# Patient Record
Sex: Female | Born: 1960
Health system: Southern US, Community
[De-identification: ages and names within clinical notes are randomized; demographics above are authoritative.]

## PROBLEM LIST (undated history)

## (undated) DIAGNOSIS — E119 Type 2 diabetes mellitus without complications: Secondary | ICD-10-CM

## (undated) DIAGNOSIS — E785 Hyperlipidemia, unspecified: Secondary | ICD-10-CM

## (undated) DIAGNOSIS — I10 Essential (primary) hypertension: Secondary | ICD-10-CM

## (undated) DIAGNOSIS — F1910 Other psychoactive substance abuse, uncomplicated: Secondary | ICD-10-CM

## (undated) DIAGNOSIS — E559 Vitamin D deficiency, unspecified: Secondary | ICD-10-CM

## (undated) DIAGNOSIS — B1920 Unspecified viral hepatitis C without hepatic coma: Secondary | ICD-10-CM

## (undated) DIAGNOSIS — Z972 Presence of dental prosthetic device (complete) (partial): Secondary | ICD-10-CM

## (undated) DIAGNOSIS — B182 Chronic viral hepatitis C: Secondary | ICD-10-CM

## (undated) HISTORY — DX: Other psychoactive substance abuse, uncomplicated: F19.10

## (undated) HISTORY — DX: Essential (primary) hypertension: I10

## (undated) HISTORY — DX: Chronic viral hepatitis C: B18.2

## (undated) HISTORY — PX: LAPAROSCOPIC HYSTERECTOMY: SHX1926

## (undated) HISTORY — DX: Unspecified viral hepatitis C without hepatic coma: B19.20

## (undated) HISTORY — PX: HUMERUS SURGERY: SHX672

---

## 2008-05-29 ENCOUNTER — Emergency Department: Payer: Self-pay | Admitting: Emergency Medicine

## 2008-07-06 ENCOUNTER — Ambulatory Visit: Payer: Self-pay | Admitting: Surgery

## 2008-07-12 ENCOUNTER — Ambulatory Visit: Payer: Self-pay | Admitting: Surgery

## 2009-06-23 HISTORY — PX: CHOLECYSTECTOMY: SHX55

## 2009-09-04 ENCOUNTER — Ambulatory Visit: Payer: Self-pay | Admitting: Family Medicine

## 2010-03-15 ENCOUNTER — Other Ambulatory Visit: Payer: Self-pay | Admitting: Specialist

## 2010-05-23 ENCOUNTER — Other Ambulatory Visit: Payer: Self-pay | Admitting: Specialist

## 2010-06-18 ENCOUNTER — Other Ambulatory Visit: Payer: Self-pay | Admitting: Specialist

## 2010-08-22 LAB — HM MAMMOGRAPHY: HM Mammogram: NORMAL

## 2010-09-17 ENCOUNTER — Ambulatory Visit: Payer: Self-pay | Admitting: Family Medicine

## 2010-10-25 LAB — HEPATIC FUNCTION PANEL
AST: 13 U/L (ref 13–35)
Alkaline Phosphatase: 54 U/L (ref 25–125)

## 2010-10-25 LAB — LIPID PANEL: LDL Cholesterol: 78 mg/dL

## 2010-10-25 LAB — CBC AND DIFFERENTIAL: WBC: 2.8 10^3/mL

## 2011-01-31 LAB — HEPATIC FUNCTION PANEL
ALT: 6 U/L — AB (ref 7–35)
AST: 14 U/L (ref 13–35)
Alkaline Phosphatase: 54 U/L (ref 25–125)
Bilirubin, Total: 0.3 mg/dL

## 2011-01-31 LAB — BASIC METABOLIC PANEL: Potassium: 4.3 mmol/L (ref 3.4–5.3)

## 2011-01-31 LAB — LIPID PANEL
Cholesterol: 154 mg/dL (ref 0–200)
Cholesterol: 181 mg/dL (ref 0–200)

## 2011-01-31 LAB — CBC AND DIFFERENTIAL
HCT: 34 % — AB (ref 36–46)
Hemoglobin: 11.7 g/dL — AB (ref 12.0–16.0)
Platelets: 266 10*3/uL (ref 150–399)

## 2011-01-31 LAB — TSH: TSH: 1.33 u[IU]/mL (ref 0.41–5.90)

## 2011-05-23 LAB — HEPATIC FUNCTION PANEL
AST: 14 U/L (ref 13–35)
Bilirubin, Total: 0.4 mg/dL

## 2011-05-23 LAB — BASIC METABOLIC PANEL
BUN: 10 mg/dL (ref 4–21)
Glucose: 104 mg/dL
Potassium: 4.4 mmol/L (ref 3.4–5.3)
Sodium: 139 mmol/L (ref 137–147)

## 2011-05-23 LAB — LIPID PANEL
HDL: 53 mg/dL (ref 35–70)
Triglycerides: 91 mg/dL (ref 40–160)

## 2011-07-11 ENCOUNTER — Ambulatory Visit (INDEPENDENT_AMBULATORY_CARE_PROVIDER_SITE_OTHER): Payer: PRIVATE HEALTH INSURANCE | Admitting: Internal Medicine

## 2011-07-11 ENCOUNTER — Ambulatory Visit: Payer: Self-pay | Admitting: Internal Medicine

## 2011-07-11 ENCOUNTER — Encounter: Payer: Self-pay | Admitting: Internal Medicine

## 2011-07-11 DIAGNOSIS — B182 Chronic viral hepatitis C: Secondary | ICD-10-CM

## 2011-07-11 DIAGNOSIS — J309 Allergic rhinitis, unspecified: Secondary | ICD-10-CM | POA: Insufficient documentation

## 2011-07-11 DIAGNOSIS — I10 Essential (primary) hypertension: Secondary | ICD-10-CM

## 2011-07-11 HISTORY — DX: Essential (primary) hypertension: I10

## 2011-07-11 HISTORY — DX: Chronic viral hepatitis C: B18.2

## 2011-07-11 MED ORDER — CETIRIZINE HCL 10 MG PO TABS: 10.0000 mg | ORAL_TABLET | Freq: Every day | ORAL | Status: DC

## 2011-07-11 MED ORDER — LISINOPRIL 10 MG PO TABS: 10.0000 mg | ORAL_TABLET | Freq: Every day | ORAL | Status: DC

## 2011-07-11 NOTE — Assessment & Plan Note (Signed)
Will request records from ID physician.

## 2011-07-11 NOTE — Assessment & Plan Note (Signed)
Symptoms well controlled on Zyrtec. Will continue.

## 2011-07-11 NOTE — Assessment & Plan Note (Signed)
BP well controlled on lisinopril. Will get old records and check renal function on recent labs. Follow up 1 months for physical.

## 2011-07-11 NOTE — Progress Notes (Signed)
Subjective:    Patient ID: Melissa Garrison, female    DOB: 1961-05-15, 51 y.o.   MRN: [id number]  HPI 51 year old female with a history of hypertension and hepatitis C presents to establish care. She reports that she is feeling well. In regards to her hypertension, she notes that her blood pressures have been well controlled on lisinopril. She denies any headache, chest pain, or shortness of breath. She reports full compliance with her medicine. She notes that her renal function was recently checked by her infectious disease physician.  In regards to her hepatitis C, she notes that she has followup with her infectious disease physician today to check levels to assess for cure. She reports completing course of medication earlier last year.  Outpatient Encounter Prescriptions as of 07/11/2011  Medication Sig Dispense Refill  . cetirizine (ZYRTEC) 10 MG tablet Take 1 tablet (10 mg total) by mouth daily.  30 tablet  6  . Cholecalciferol (VITAMIN D-3) 5000 UNITS TABS Take 1 tablet by mouth daily.      . Ferrous Gluconate (IRON) 240 (27 FE) MG TABS Take 1 tablet by mouth daily.      Marland Kitchen lisinopril (PRINIVIL,ZESTRIL) 10 MG tablet Take 1 tablet (10 mg total) by mouth daily.  30 tablet  6    Review of Systems  Constitutional: Negative for fever, chills, appetite change, fatigue and unexpected weight change.  HENT: Negative for ear pain, congestion, sore throat, trouble swallowing, neck pain, voice change and sinus pressure.   Eyes: Negative for visual disturbance.  Respiratory: Negative for cough, shortness of breath, wheezing and stridor.   Cardiovascular: Negative for chest pain, palpitations and leg swelling.  Gastrointestinal: Negative for nausea, vomiting, abdominal pain, diarrhea, constipation, blood in stool, abdominal distention and anal bleeding.  Genitourinary: Negative for dysuria and flank pain.  Musculoskeletal: Negative for myalgias, arthralgias and gait problem.  Skin: Negative for  color change and rash.  Neurological: Negative for dizziness and headaches.  Hematological: Negative for adenopathy. Does not bruise/bleed easily.  Psychiatric/Behavioral: Negative for suicidal ideas, sleep disturbance and dysphoric mood. The patient is not nervous/anxious.    BP 128/78  Pulse 74  Temp(Src) 98.7 F (37.1 C) (Oral)  Ht 5\' 4"  (1.626 m)  Wt 167 lb (75.751 kg)  BMI 28.67 kg/m2  SpO2 98%     Objective:   Physical Exam  Constitutional: She is oriented to person, place, and time. She appears well-developed and well-nourished. No distress.  HENT:  Head: Normocephalic and atraumatic.  Right Ear: External ear normal.  Left Ear: External ear normal.  Nose: Nose normal.  Mouth/Throat: Oropharynx is clear and moist. No oropharyngeal exudate.  Eyes: Conjunctivae are normal. Pupils are equal, round, and reactive to light. Right eye exhibits no discharge. Left eye exhibits no discharge. No scleral icterus.  Neck: Normal range of motion. Neck supple. No tracheal deviation present. No thyromegaly present.  Cardiovascular: Normal rate, regular rhythm, normal heart sounds and intact distal pulses.  Exam reveals no gallop and no friction rub.   No murmur heard. Pulmonary/Chest: Effort normal and breath sounds normal. No respiratory distress. She has no wheezes. She has no rales. She exhibits no tenderness.  Musculoskeletal: Normal range of motion. She exhibits no edema and no tenderness.  Lymphadenopathy:    She has no cervical adenopathy.  Neurological: She is alert and oriented to person, place, and time. No cranial nerve deficit. She exhibits normal muscle tone. Coordination normal.  Skin: Skin is warm and dry. No rash noted. She  is not diaphoretic. No erythema. No pallor.  Psychiatric: She has a normal mood and affect. Her behavior is normal. Judgment and thought content normal.          Assessment & Plan:

## 2011-08-21 ENCOUNTER — Encounter: Payer: Self-pay | Admitting: Internal Medicine

## 2011-08-22 ENCOUNTER — Ambulatory Visit (INDEPENDENT_AMBULATORY_CARE_PROVIDER_SITE_OTHER): Payer: PRIVATE HEALTH INSURANCE | Admitting: Internal Medicine

## 2011-08-22 ENCOUNTER — Encounter: Payer: Self-pay | Admitting: Internal Medicine

## 2011-08-22 VITALS — BP 122/66 | HR 97 | Temp 98.3°F | Ht 64.0 in | Wt 167.0 lb

## 2011-08-22 DIAGNOSIS — I10 Essential (primary) hypertension: Secondary | ICD-10-CM

## 2011-08-22 DIAGNOSIS — B182 Chronic viral hepatitis C: Secondary | ICD-10-CM

## 2011-08-22 DIAGNOSIS — M7710 Lateral epicondylitis, unspecified elbow: Secondary | ICD-10-CM

## 2011-08-22 DIAGNOSIS — M7711 Lateral epicondylitis, right elbow: Secondary | ICD-10-CM | POA: Insufficient documentation

## 2011-08-22 MED ORDER — CELECOXIB 200 MG PO CAPS: 200.0000 mg | ORAL_CAPSULE | Freq: Two times a day (BID) | ORAL | Status: AC

## 2011-08-22 NOTE — Patient Instructions (Signed)
Lateral Epicondylitis (Tennis Elbow) with Rehab Lateral epicondylitis involves inflammation and pain around the outer portion of the elbow. The pain is caused by inflammation of the tendons in the forearm that bring back (extend) the wrist. Lateral epicondylittis is also called tennis elbow, because it is very common in tennis players. However, it may occur in any individual who extends the wrist repetitively. If lateral epicondylitis is left untreated, it may become a chronic problem. SYMPTOMS   Pain, tenderness, and inflammation on the outer (lateral) side of the elbow.   Pain or weakness with gripping activities.   Pain that increases with wrist twisting motions (playing tennis, using a screwdriver, opening a door or a jar).   Pain with lifting objects, including a coffee cup.  CAUSES  Lateral epicondylitis is caused by inflammation of the tendons that extend the wrist. Causes of injury may include:  Repetitive stress and strain on the muscles and tendons that extend the wrist.   Sudden change in activity level or intensity.   Incorrect grip in racquet sports.   Incorrect grip size of racquet (often too large).   Incorrect hitting position or technique (usually backhand, leading with the elbow).   Using a racket that is too heavy.  RISK INCREASES WITH:  Sports or occupations that require repetitive and/or strenuous forearm and wrist movements (tennis, squash, racquetball, carpentry).   Poor wrist and forearm strength and flexibility.   Failure to warm up properly before activity.   Resuming activity before healing, rehabilitation, and conditioning are complete.  PREVENTION   Warm up and stretch properly before activity.   Maintain physical fitness:   Strength, flexibility, and endurance.   Cardiovascular fitness.   Wear and use properly fitted equipment.   Learn and use proper technique and have a coach correct improper technique.   Wear a tennis elbow  (counterforce) brace.  PROGNOSIS  The course of this condition depends on the degree of the injury. If treated properly, acute cases (symptoms lasting less than 4 weeks) are often resolved in 2 to 6 weeks. Chronic (longer lasting cases) often resolve in 3 to 6 months, but may require physical therapy. RELATED COMPLICATIONS   Frequently recurring symptoms, resulting in a chronic problem. Properly treating the problem the first time decreases frequency of recurrence.   Chronic inflammation, scarring tendon degeneration, and partial tendon tear, requiring surgery.   Delayed healing or resolution of symptoms.  TREATMENT  Treatment first involves the use of ice and medicine, to reduce pain and inflammation. Strengthening and stretching exercises may help reduce discomfort, if performed regularly. These exercises may be performed at home, if the condition is an acute injury. Chronic cases may require a referral to a physical therapist for evaluation and treatment. Your caregiver may advise a corticosteroid injection, to help reduce inflammation. Rarely, surgery is needed. MEDICATION  If pain medicine is needed, nonsteroidal anti-inflammatory medicines (aspirin and ibuprofen), or other minor pain relievers (acetaminophen), are often advised.   Do not take pain medicine for 7 days before surgery.   Prescription pain relievers may be given, if your caregiver thinks they are needed. Use only as directed and only as much as you need.   Corticosteroid injections may be recommended. These injections should be reserved only for the most severe cases, because they can only be given a certain number of times.  HEAT AND COLD  Cold treatment (icing) should be applied for 10 to 15 minutes every 2 to 3 hours for inflammation and pain, and immediately   after activity that aggravates your symptoms. Use ice packs or an ice massage.   Heat treatment may be used before performing stretching and strengthening  activities prescribed by your caregiver, physical therapist, or athletic trainer. Use a heat pack or a warm water soak.  SEEK MEDICAL CARE IF: Symptoms get worse or do not improve in 2 weeks, despite treatment. EXERCISES  RANGE OF MOTION (ROM) AND STRETCHING EXERCISES - Epicondylitis, Lateral (Tennis Elbow) These exercises may help you when beginning to rehabilitate your injury. Your symptoms may go away with or without further involvement from your physician, physical therapist or athletic trainer. While completing these exercises, remember:   Restoring tissue flexibility helps normal motion to return to the joints. This allows healthier, less painful movement and activity.   An effective stretch should be held for at least 30 seconds.   A stretch should never be painful. You should only feel a gentle lengthening or release in the stretched tissue.  RANGE OF MOTION - Wrist Flexion, Active-Assisted  Extend your right / left elbow with your fingers pointing down.*   Gently pull the back of your hand towards you, until you feel a gentle stretch on the top of your forearm.   Hold this position for __________ seconds.  Repeat __________ times. Complete this exercise __________ times per day.  *If directed by your physician, physical therapist or athletic trainer, complete this stretch with your elbow bent, rather than extended. RANGE OF MOTION - Wrist Extension, Active-Assisted  Extend your right / left elbow and turn your palm upwards.*   Gently pull your palm and fingertips back, so your wrist extends and your fingers point more toward the ground.   You should feel a gentle stretch on the inside of your forearm.   Hold this position for __________ seconds.  Repeat __________ times. Complete this exercise __________ times per day. *If directed by your physician, physical therapist or athletic trainer, complete this stretch with your elbow bent, rather than extended. STRETCH - Wrist  Flexion  Place the back of your right / left hand on a tabletop, leaving your elbow slightly bent. Your fingers should point away from your body.   Gently press the back of your hand down onto the table by straightening your elbow. You should feel a stretch on the top of your forearm.   Hold this position for __________ seconds.  Repeat __________ times. Complete this stretch __________ times per day.  STRETCH - Wrist Extension   Place your right / left fingertips on a tabletop, leaving your elbow slightly bent. Your fingers should point backwards.   Gently press your fingers and palm down onto the table by straightening your elbow. You should feel a stretch on the inside of your forearm.   Hold this position for __________ seconds.  Repeat __________ times. Complete this stretch __________ times per day.  STRENGTHENING EXERCISES - Epicondylitis, Lateral (Tennis Elbow) These exercises may help you when beginning to rehabilitate your injury. They may resolve your symptoms with or without further involvement from your physician, physical therapist or athletic trainer. While completing these exercises, remember:   Muscles can gain both the endurance and the strength needed for everyday activities through controlled exercises.   Complete these exercises as instructed by your physician, physical therapist or athletic trainer. Increase the resistance and repetitions only as guided.   You may experience muscle soreness or fatigue, but the pain or discomfort you are trying to eliminate should never worsen during these exercises. If   this pain does get worse, stop and make sure you are following the directions exactly. If the pain is still present after adjustments, discontinue the exercise until you can discuss the trouble with your caregiver.  STRENGTH - Wrist Flexors  Sit with your right / left forearm palm-up and fully supported on a table or countertop. Your elbow should be resting below the  height of your shoulder. Allow your wrist to extend over the edge of the surface.   Loosely holding a __________ weight, or a piece of rubber exercise band or tubing, slowly curl your hand up toward your forearm.   Hold this position for __________ seconds. Slowly lower the wrist back to the starting position in a controlled manner.  Repeat __________ times. Complete this exercise __________ times per day.  STRENGTH - Wrist Extensors  Sit with your right / left forearm palm-down and fully supported on a table or countertop. Your elbow should be resting below the height of your shoulder. Allow your wrist to extend over the edge of the surface.   Loosely holding a __________ weight, or a piece of rubber exercise band or tubing, slowly curl your hand up toward your forearm.   Hold this position for __________ seconds. Slowly lower the wrist back to the starting position in a controlled manner.  Repeat __________ times. Complete this exercise __________ times per day.  STRENGTH - Ulnar Deviators  Stand with a ____________________ weight in your right / left hand, or sit while holding a rubber exercise band or tubing, with your healthy arm supported on a table or countertop.   Move your wrist, so that your pinkie travels toward your forearm and your thumb moves away from your forearm.   Hold this position for __________ seconds and then slowly lower the wrist back to the starting position.  Repeat __________ times. Complete this exercise __________ times per day STRENGTH - Radial Deviators  Stand with a ____________________ weight in your right / left hand, or sit while holding a rubber exercise band or tubing, with your injured arm supported on a table or countertop.   Raise your hand upward in front of you or pull up on the rubber tubing.   Hold this position for __________ seconds and then slowly lower the wrist back to the starting position.  Repeat __________ times. Complete this  exercise __________ times per day. STRENGTH - Forearm Supinators   Sit with your right / left forearm supported on a table, keeping your elbow below shoulder height. Rest your hand over the edge, palm down.   Gently grip a hammer or a soup ladle.   Without moving your elbow, slowly turn your palm and hand upward to a "thumbs-up" position.   Hold this position for __________ seconds. Slowly return to the starting position.  Repeat __________ times. Complete this exercise __________ times per day.  STRENGTH - Forearm Pronators   Sit with your right / left forearm supported on a table, keeping your elbow below shoulder height. Rest your hand over the edge, palm up.   Gently grip a hammer or a soup ladle.   Without moving your elbow, slowly turn your palm and hand upward to a "thumbs-up" position.   Hold this position for __________ seconds. Slowly return to the starting position.  Repeat __________ times. Complete this exercise __________ times per day.  STRENGTH - Grip  Grasp a tennis ball, a dense sponge, or a large, rolled sock in your hand.   Squeeze as hard   as you can, without increasing any pain.   Hold this position for __________ seconds. Release your grip slowly.  Repeat __________ times. Complete this exercise __________ times per day.  STRENGTH - Elbow Extensors, Isometric  Stand or sit upright, on a firm surface. Place your right / left arm so that your palm faces your stomach, and it is at the height of your waist.   Place your opposite hand on the underside of your forearm. Gently push up as your right / left arm resists. Push as hard as you can with both arms, without causing any pain or movement at your right / left elbow. Hold this stationary position for __________ seconds.  Gradually release the tension in both arms. Allow your muscles to relax completely before repeating. Document Released: 06/09/2005 Document Revised: 02/19/2011 Document Reviewed:  09/21/2008 [employer] Patient Information 2012 [employer]. 

## 2011-08-22 NOTE — Progress Notes (Signed)
Subjective:    Patient ID: Melissa Garrison, female    DOB: 07-15-1960, 51 y.o.   MRN: [id number]  HPI 51YO female with h/o hypertension and Hep C presents for follow up.  In regards to [facility], she notes recent PCR testing was negative.  She has been discharged from her ID physician. In regards to HTN, she reports full compliance with her medications.  She denies chest pain, palpitations, or headache.  She is concerned today about several days of lateral right elbow pain.  Pain radiates down right forearm, described as aching.  She works in a [employer] where she frequently uses her arms.  She has not taken any medication for pain.  She has not had any weakness in her right arm.  Outpatient Encounter Prescriptions as of 08/22/2011  Medication Sig Dispense Refill  . cetirizine (ZYRTEC) 10 MG tablet Take 1 tablet (10 mg total) by mouth daily.  30 tablet  6  . Cholecalciferol (VITAMIN D-3) 5000 UNITS TABS Take 1 tablet by mouth daily.      . diphenhydramine-acetaminophen (TYLENOL PM) 25-500 MG TABS Take 1 tablet by mouth at bedtime as needed.      . Ferrous Gluconate (IRON) 240 (27 FE) MG TABS Take 1 tablet by mouth daily.      Marland Kitchen lisinopril (PRINIVIL,ZESTRIL) 10 MG tablet Take 1 tablet (10 mg total) by mouth daily.  30 tablet  6  . celecoxib (CELEBREX) 200 MG capsule Take 1 capsule (200 mg total) by mouth 2 (two) times daily.  30 capsule  0    Review of Systems  Constitutional: Negative for fever, chills, appetite change, fatigue and unexpected weight change.  HENT: Negative for ear pain, congestion, sore throat, trouble swallowing, neck pain, voice change and sinus pressure.   Eyes: Negative for visual disturbance.  Respiratory: Negative for cough, shortness of breath, wheezing and stridor.   Cardiovascular: Negative for chest pain, palpitations and leg swelling.  Gastrointestinal: Negative for nausea, vomiting, abdominal pain, diarrhea, constipation, blood in stool, abdominal distention and  anal bleeding.  Genitourinary: Negative for dysuria and flank pain.  Musculoskeletal: Positive for myalgias and arthralgias. Negative for gait problem.  Skin: Negative for color change and rash.  Neurological: Negative for dizziness and headaches.  Hematological: Negative for adenopathy. Does not bruise/bleed easily.  Psychiatric/Behavioral: Negative for suicidal ideas, sleep disturbance and dysphoric mood. The patient is not nervous/anxious.    BP 122/66  Pulse 97  Temp(Src) 98.3 F (36.8 C) (Oral)  Ht 5\' 4"  (1.626 m)  Wt 167 lb (75.751 kg)  BMI 28.67 kg/m2  SpO2 100%     Objective:   Physical Exam  Constitutional: She is oriented to person, place, and time. She appears well-developed and well-nourished. No distress.  HENT:  Head: Normocephalic and atraumatic.  Right Ear: External ear normal.  Left Ear: External ear normal.  Nose: Nose normal.  Mouth/Throat: Oropharynx is clear and moist. No oropharyngeal exudate.  Eyes: Conjunctivae are normal. Pupils are equal, round, and reactive to light. Right eye exhibits no discharge. Left eye exhibits no discharge. No scleral icterus.  Neck: Normal range of motion. Neck supple. No tracheal deviation present. No thyromegaly present.  Cardiovascular: Normal rate, regular rhythm, normal heart sounds and intact distal pulses.  Exam reveals no gallop and no friction rub.   No murmur heard. Pulmonary/Chest: Effort normal and breath sounds normal. No respiratory distress. She has no wheezes. She has no rales. She exhibits no tenderness.  Musculoskeletal: Normal range of motion. She  exhibits no edema and no tenderness.       Right elbow: She exhibits normal range of motion and no swelling. tenderness found. Lateral epicondyle tenderness noted.  Lymphadenopathy:    She has no cervical adenopathy.  Neurological: She is alert and oriented to person, place, and time. No cranial nerve deficit. She exhibits normal muscle tone. Coordination normal.    Skin: Skin is warm and dry. No rash noted. She is not diaphoretic. No erythema. No pallor.  Psychiatric: She has a normal mood and affect. Her behavior is normal. Judgment and thought content normal.          Assessment & Plan:

## 2011-08-22 NOTE — Assessment & Plan Note (Addendum)
Secondary to repeated trauma/use at work.  Will start Celebrex 200mg  po bid.  Pt will rest arm and use sling this weekend. Information given to pt. She will call if symptoms are persistent.  Suspect she will have recurrent episodes given her work.

## 2011-08-22 NOTE — Assessment & Plan Note (Signed)
Per notes from ID physician, Hep C undetectable by PCR.

## 2011-08-22 NOTE — Assessment & Plan Note (Signed)
BP well controlled on lisinopril. Recent renal function normal. Follow up in 10/2011 for physical exam.

## 2011-08-28 ENCOUNTER — Telehealth: Payer: Self-pay | Admitting: *Deleted

## 2011-08-28 DIAGNOSIS — M7711 Lateral epicondylitis, right elbow: Secondary | ICD-10-CM

## 2011-08-28 NOTE — Telephone Encounter (Signed)
Pt left VM - celebrex has helped but she still has pain. Please advise.

## 2011-08-28 NOTE — Telephone Encounter (Signed)
Lets try a prednisone taper 60mg  day 1, then taper by 10mg  daily until gone.  We should also set her up with sports medicine.

## 2011-08-29 MED ORDER — PREDNISONE 10 MG PO TABS: ORAL_TABLET | ORAL | Status: DC

## 2011-08-29 NOTE — Telephone Encounter (Signed)
Patient informed, RX sent, referral entered

## 2011-09-03 ENCOUNTER — Telehealth: Payer: Self-pay | Admitting: Internal Medicine

## 2011-09-03 DIAGNOSIS — M069 Rheumatoid arthritis, unspecified: Secondary | ICD-10-CM

## 2011-09-03 NOTE — Telephone Encounter (Signed)
Labs show markers for rheumatoid arthritis are positive. I would like to set her up with rheumatologist for further evaluation.  I would recommend going to [facility] or [facility].

## 2011-09-03 NOTE — Telephone Encounter (Signed)
Left detailed VM on hm #. I req a call back to know if she wanted to be referred to [facility] or [facility].

## 2011-09-03 NOTE — Telephone Encounter (Signed)
Pt perfers [facility], referral entered

## 2011-09-14 ENCOUNTER — Ambulatory Visit: Payer: Self-pay | Admitting: Specialist

## 2011-10-03 ENCOUNTER — Ambulatory Visit: Payer: Self-pay | Admitting: Family Medicine

## 2011-11-19 ENCOUNTER — Telehealth: Payer: Self-pay | Admitting: Internal Medicine

## 2011-11-19 NOTE — Telephone Encounter (Signed)
Left message asking patient to call back

## 2011-11-19 NOTE — Telephone Encounter (Signed)
Labs show elevated blood sugar. Pt will need to make an appointment to review.

## 2011-11-24 NOTE — Telephone Encounter (Signed)
Left detailed message notifying patient. Asked that she call back to schedule appt

## 2011-11-28 ENCOUNTER — Other Ambulatory Visit (HOSPITAL_COMMUNITY)
Admission: RE | Admit: 2011-11-28 | Discharge: 2011-11-28 | Disposition: A | Discharge status: Home / Self Care | Payer: PRIVATE HEALTH INSURANCE | Source: Ambulatory Visit | Attending: Internal Medicine | Admitting: Internal Medicine

## 2011-11-28 ENCOUNTER — Encounter: Payer: Self-pay | Admitting: Internal Medicine

## 2011-11-28 ENCOUNTER — Ambulatory Visit (INDEPENDENT_AMBULATORY_CARE_PROVIDER_SITE_OTHER): Payer: PRIVATE HEALTH INSURANCE | Admitting: Internal Medicine

## 2011-11-28 VITALS — BP 118/78 | HR 71 | Temp 98.4°F | Ht 65.0 in | Wt 171.8 lb

## 2011-11-28 DIAGNOSIS — Z1159 Encounter for screening for other viral diseases: Secondary | ICD-10-CM | POA: Insufficient documentation

## 2011-11-28 DIAGNOSIS — I10 Essential (primary) hypertension: Secondary | ICD-10-CM

## 2011-11-28 DIAGNOSIS — B3731 Acute candidiasis of vulva and vagina: Secondary | ICD-10-CM

## 2011-11-28 DIAGNOSIS — Z0000 Encounter for general adult medical examination without abnormal findings: Secondary | ICD-10-CM | POA: Insufficient documentation

## 2011-11-28 DIAGNOSIS — Z01419 Encounter for gynecological examination (general) (routine) without abnormal findings: Secondary | ICD-10-CM | POA: Insufficient documentation

## 2011-11-28 DIAGNOSIS — B373 Candidiasis of vulva and vagina: Secondary | ICD-10-CM

## 2011-11-28 DIAGNOSIS — L299 Pruritus, unspecified: Secondary | ICD-10-CM | POA: Insufficient documentation

## 2011-11-28 MED ORDER — HYDROXYZINE HCL 10 MG PO TABS: 10.0000 mg | ORAL_TABLET | Freq: Every evening | ORAL | Status: AC | PRN

## 2011-11-28 MED ORDER — FLUCONAZOLE 150 MG PO TABS: 150.0000 mg | ORAL_TABLET | Freq: Once | ORAL | Status: AC

## 2011-11-28 NOTE — Progress Notes (Signed)
Subjective:    Patient ID: Melissa Garrison, female    DOB: 1960/07/21, 51 y.o.   MRN: [id number]  HPI 51 year old female with history of hypertension presents for annual exam. She reports that she is generally feeling well. She has complained of some vaginal pain and occasional itching. She occasionally has whitish discharge. She denies any pelvic pain, fever, chills. She denies any dysuria or flank pain. Aside from this, she reports she is doing well. She reports full compliance with her medications. She denies any headache, chest pain, palpitations. She reports normal energy level. She follows a healthy diet and is physically active. She is up-to-date on health maintenance.  Outpatient Encounter Prescriptions as of 11/28/2011  Medication Sig Dispense Refill  . cetirizine (ZYRTEC) 10 MG tablet Take 1 tablet (10 mg total) by mouth daily.  30 tablet  6  . Cholecalciferol (VITAMIN D-3) 5000 UNITS TABS Take 1 tablet by mouth daily.      . Ferrous Gluconate (IRON) 240 (27 FE) MG TABS Take 1 tablet by mouth daily.      Marland Kitchen lisinopril (PRINIVIL,ZESTRIL) 10 MG tablet Take 1 tablet (10 mg total) by mouth daily.  30 tablet  6  . DISCONTD: diphenhydramine-acetaminophen (TYLENOL PM) 25-500 MG TABS Take 1 tablet by mouth at bedtime as needed.      Marland Kitchen DISCONTD: predniSONE (DELTASONE) 10 MG tablet 60 mg day 1, then taper by 10 mg daily until gone  21 tablet  0  . fluconazole (DIFLUCAN) 150 MG tablet Take 1 tablet (150 mg total) by mouth once.  3 tablet  0  . hydrOXYzine (ATARAX/VISTARIL) 10 MG tablet Take 1 tablet (10 mg total) by mouth at bedtime as needed for itching.  30 tablet  6    Review of Systems  Constitutional: Negative for fever, chills, appetite change, fatigue and unexpected weight change.  HENT: Negative for ear pain, congestion, sore throat, trouble swallowing, neck pain, voice change and sinus pressure.   Eyes: Negative for visual disturbance.  Respiratory: Negative for cough, shortness of  breath, wheezing and stridor.   Cardiovascular: Negative for chest pain, palpitations and leg swelling.  Gastrointestinal: Negative for nausea, vomiting, abdominal pain, diarrhea, constipation, blood in stool, abdominal distention and anal bleeding.  Genitourinary: Positive for vaginal discharge and vaginal pain. Negative for dysuria, frequency, hematuria, flank pain, vaginal bleeding, menstrual problem and pelvic pain.  Musculoskeletal: Negative for myalgias, arthralgias and gait problem.  Skin: Negative for color change and rash.  Neurological: Negative for dizziness and headaches.  Hematological: Negative for adenopathy. Does not bruise/bleed easily.  Psychiatric/Behavioral: Negative for suicidal ideas, sleep disturbance and dysphoric mood. The patient is not nervous/anxious.    BP 118/78  Pulse 71  Temp(Src) 98.4 F (36.9 C) (Oral)  Ht 5\' 5"  (1.651 m)  Wt 171 lb 12 oz (77.905 kg)  BMI 28.58 kg/m2  SpO2 99%     Objective:   Physical Exam  Constitutional: She is oriented to person, place, and time. She appears well-developed and well-nourished. No distress.  HENT:  Head: Normocephalic and atraumatic.  Right Ear: External ear normal.  Left Ear: External ear normal.  Nose: Nose normal.  Mouth/Throat: Oropharynx is clear and moist. No oropharyngeal exudate.  Eyes: Conjunctivae are normal. Pupils are equal, round, and reactive to light. Right eye exhibits no discharge. Left eye exhibits no discharge. No scleral icterus.  Neck: Normal range of motion. Neck supple. No tracheal deviation present. No thyromegaly present.  Cardiovascular: Normal rate, regular rhythm, normal heart sounds  and intact distal pulses.  Exam reveals no gallop and no friction rub.   No murmur heard. Pulmonary/Chest: Effort normal and breath sounds normal. No respiratory distress. She has no wheezes. She has no rales. She exhibits no tenderness.  Abdominal: Soft. Bowel sounds are normal. She exhibits no distension  and no mass. There is no tenderness. There is no rebound and no guarding.  Genitourinary: Rectum normal and uterus normal. No breast swelling, tenderness, discharge or bleeding. Pelvic exam was performed with patient supine. There is no rash, tenderness or lesion on the right labia. There is no rash, tenderness or lesion on the left labia. Uterus is not enlarged and not tender. Cervix exhibits no motion tenderness, no discharge and no friability. Right adnexum displays no mass, no tenderness and no fullness. Left adnexum displays no mass, no tenderness and no fullness. There is erythema around the vagina. No tenderness around the vagina. Vaginal discharge found.       White patches and discharge throughout vagina  Musculoskeletal: Normal range of motion. She exhibits no edema and no tenderness.  Lymphadenopathy:    She has no cervical adenopathy.  Neurological: She is alert and oriented to person, place, and time. No cranial nerve deficit. She exhibits normal muscle tone. Coordination normal.  Skin: Skin is warm and dry. No rash noted. She is not diaphoretic. No erythema. No pallor.  Psychiatric: She has a normal mood and affect. Her behavior is normal. Judgment and thought content normal.          Assessment & Plan:

## 2011-11-28 NOTE — Assessment & Plan Note (Signed)
General medical exam including breast and pelvic exam are normal except as noted. Pap is pending. Findings on pelvic exam are consistent with vaginal candidiasis, and patient will be treated with Diflucan. She will call if symptoms are persistent. Patient is up-to-date on vaccinations and will bring a record and from work. Mammogram is up-to-date. Labwork is also up to date. Patient will followup in 6 months or sooner as needed.

## 2011-11-30 LAB — HM PAP SMEAR: HM Pap smear: NORMAL

## 2011-12-04 ENCOUNTER — Other Ambulatory Visit: Payer: Self-pay | Admitting: *Deleted

## 2011-12-04 MED ORDER — AZITHROMYCIN 500 MG PO TABS: 1000.0000 mg | ORAL_TABLET | Freq: Once | ORAL | Status: DC

## 2012-03-22 ENCOUNTER — Emergency Department: Payer: Self-pay | Admitting: Emergency Medicine

## 2012-03-23 ENCOUNTER — Other Ambulatory Visit: Payer: Self-pay

## 2012-03-23 DIAGNOSIS — I10 Essential (primary) hypertension: Secondary | ICD-10-CM

## 2012-03-23 MED ORDER — LISINOPRIL 10 MG PO TABS: 10.0000 mg | ORAL_TABLET | Freq: Every day | ORAL | Status: DC

## 2012-03-23 NOTE — Telephone Encounter (Signed)
Patient request refill for Lisinopril 10 mg. Patient was advised to keep appt schedule in December for follow up for further refills. Sent in Lisinipril 10 mg #30 2 R to [facility].Marland Kitchen

## 2012-04-01 ENCOUNTER — Ambulatory Visit (INDEPENDENT_AMBULATORY_CARE_PROVIDER_SITE_OTHER)
Admission: RE | Admit: 2012-04-01 | Discharge: 2012-04-01 | Disposition: A | Discharge status: Home / Self Care | Payer: PRIVATE HEALTH INSURANCE | Source: Ambulatory Visit | Attending: Internal Medicine | Admitting: Internal Medicine

## 2012-04-01 ENCOUNTER — Encounter: Payer: Self-pay | Admitting: Internal Medicine

## 2012-04-01 ENCOUNTER — Ambulatory Visit (INDEPENDENT_AMBULATORY_CARE_PROVIDER_SITE_OTHER): Payer: PRIVATE HEALTH INSURANCE | Admitting: Internal Medicine

## 2012-04-01 ENCOUNTER — Other Ambulatory Visit (HOSPITAL_COMMUNITY)
Admission: RE | Admit: 2012-04-01 | Discharge: 2012-04-01 | Disposition: A | Discharge status: Home / Self Care | Payer: PRIVATE HEALTH INSURANCE | Source: Ambulatory Visit | Attending: Internal Medicine | Admitting: Internal Medicine

## 2012-04-01 VITALS — BP 130/80 | HR 79 | Temp 98.4°F | Ht 65.0 in | Wt 171.2 lb

## 2012-04-01 DIAGNOSIS — M542 Cervicalgia: Secondary | ICD-10-CM

## 2012-04-01 DIAGNOSIS — N760 Acute vaginitis: Secondary | ICD-10-CM | POA: Insufficient documentation

## 2012-04-01 DIAGNOSIS — A5909 Other urogenital trichomoniasis: Secondary | ICD-10-CM

## 2012-04-01 MED ORDER — METRONIDAZOLE 500 MG PO TABS: 2000.0000 mg | ORAL_TABLET | Freq: Once | ORAL | Status: DC

## 2012-04-01 MED ORDER — AZITHROMYCIN 500 MG PO TABS: 1000.0000 mg | ORAL_TABLET | Freq: Once | ORAL | Status: DC

## 2012-04-01 MED ORDER — IBUPROFEN 800 MG PO TABS: 800.0000 mg | ORAL_TABLET | Freq: Three times a day (TID) | ORAL | Status: DC | PRN

## 2012-04-01 MED ORDER — CYCLOBENZAPRINE HCL 10 MG PO TABS: 10.0000 mg | ORAL_TABLET | Freq: Three times a day (TID) | ORAL | Status: DC | PRN

## 2012-04-01 MED ORDER — HYDROCODONE-ACETAMINOPHEN 5-500 MG PO TABS: 1.0000 | ORAL_TABLET | Freq: Three times a day (TID) | ORAL | Status: DC | PRN
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
## 2012-04-01 NOTE — Assessment & Plan Note (Signed)
Azithromycin sent in error before. Will treat with Metronidazole 2gm po x1.  Will send repeat urine to eval for persistent trichomonas.
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
## 2012-04-02 LAB — GC/CHLAMYDIA PROBE AMP, URINE
Chlamydia, Swab/Urine, PCR: NEGATIVE
GC Probe Amp, Urine: NEGATIVE

## 2012-04-08 ENCOUNTER — Other Ambulatory Visit (HOSPITAL_COMMUNITY)
Admission: RE | Admit: 2012-04-08 | Discharge: 2012-04-08 | Disposition: A | Discharge status: Home / Self Care | Payer: PRIVATE HEALTH INSURANCE | Source: Ambulatory Visit | Attending: Internal Medicine | Admitting: Internal Medicine

## 2012-04-08 ENCOUNTER — Encounter: Payer: Self-pay | Admitting: Internal Medicine

## 2012-04-08 ENCOUNTER — Ambulatory Visit (INDEPENDENT_AMBULATORY_CARE_PROVIDER_SITE_OTHER): Payer: PRIVATE HEALTH INSURANCE | Admitting: Internal Medicine

## 2012-04-08 VITALS — BP 120/70 | HR 85 | Temp 97.6°F | Ht 65.0 in | Wt 171.0 lb

## 2012-04-08 DIAGNOSIS — M542 Cervicalgia: Secondary | ICD-10-CM

## 2012-04-08 DIAGNOSIS — A5909 Other urogenital trichomoniasis: Secondary | ICD-10-CM

## 2012-04-08 DIAGNOSIS — A599 Trichomoniasis, unspecified: Secondary | ICD-10-CM

## 2012-04-08 DIAGNOSIS — N760 Acute vaginitis: Secondary | ICD-10-CM | POA: Insufficient documentation
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
## 2012-04-08 NOTE — Assessment & Plan Note (Signed)
Symptoms have resolved with treatment with Flagyl. Will send a repeat urine cytology for test of cure today.

## 2012-04-08 NOTE — Assessment & Plan Note (Signed)
Symptoms have improved with use of Flexeril and Vicodin for severe pain. Will set up physical therapy. Plain x-ray of the cervical spine was normal. Followup in 3 months.

## 2012-04-09 ENCOUNTER — Encounter: Payer: Self-pay | Admitting: *Deleted

## 2012-04-22 ENCOUNTER — Telehealth: Payer: Self-pay | Admitting: Internal Medicine

## 2012-04-22 MED ORDER — TRAZODONE HCL 50 MG PO TABS: 50.0000 mg | ORAL_TABLET | Freq: Every day | ORAL | Status: DC

## 2012-04-22 NOTE — Telephone Encounter (Signed)
Pt has questions on her phycisal therphy?  When she went it was only for her neck and pt thought it was for neck and lower back.  Left leg Please advise

## 2012-04-22 NOTE — Telephone Encounter (Signed)
Melissa Regal, Do we need to place a second PT referral?

## 2012-04-22 NOTE — Telephone Encounter (Signed)
Pt also wanted to know if you would give her something to help her sleep [facility] [address]

## 2012-04-22 NOTE — Telephone Encounter (Signed)
Patient advised via telephone, Rx for Trazodone sent to [facility] pharmacy.  Patient stated that when she went to her PT appt they only worked her neck and did nothing about her lower back pain.  She stated that the PT did not know anything about the lower back pain.

## 2012-04-22 NOTE — Telephone Encounter (Signed)
Should be PT for neck and lower back pain. If having trouble sleeping and OTC meds have failed, we can try Trazodone 50mg  po qhs, disp 30 no refill. Will need follow up visit within the next month.

## 2012-05-31 ENCOUNTER — Encounter: Payer: Self-pay | Admitting: Internal Medicine

## 2012-05-31 ENCOUNTER — Ambulatory Visit (INDEPENDENT_AMBULATORY_CARE_PROVIDER_SITE_OTHER): Payer: PRIVATE HEALTH INSURANCE | Admitting: Internal Medicine

## 2012-05-31 VITALS — BP 140/78 | HR 78 | Temp 98.2°F | Ht 64.0 in | Wt 174.5 lb

## 2012-05-31 DIAGNOSIS — M5432 Sciatica, left side: Secondary | ICD-10-CM | POA: Insufficient documentation

## 2012-05-31 DIAGNOSIS — G4700 Insomnia, unspecified: Secondary | ICD-10-CM

## 2012-05-31 DIAGNOSIS — I10 Essential (primary) hypertension: Secondary | ICD-10-CM

## 2012-05-31 DIAGNOSIS — M5430 Sciatica, unspecified side: Secondary | ICD-10-CM

## 2012-05-31 MED ORDER — PREDNISONE (PAK) 10 MG PO TABS: ORAL_TABLET | ORAL | Status: DC

## 2012-05-31 MED ORDER — TRAZODONE HCL 50 MG PO TABS: 50.0000 mg | ORAL_TABLET | Freq: Every day | ORAL | Status: DC

## 2012-05-31 MED ORDER — LISINOPRIL 10 MG PO TABS: 10.0000 mg | ORAL_TABLET | Freq: Every day | ORAL | Status: DC

## 2012-05-31 NOTE — Assessment & Plan Note (Signed)
Symptoms and exam consistent with left sided sciatic pain. No improvement with activity modification and use of ibuprofen. Will try steroid taper to see if any improvement. Pt will email with update later this week. If no improvement, would favor referral to sports medicine.

## 2012-05-31 NOTE — Patient Instructions (Signed)
Please have Tammy draw: CMP, lipids, Vit D, TSH, urine microalbumin/cr ratio, CBC.

## 2012-05-31 NOTE — Progress Notes (Signed)
Subjective:    Patient ID: Melissa Garrison, female    DOB: [birth year], 51 y.o.   MRN: [id number]  HPI 51YO female with h/o arthralgia, Hep C, and HTN presents for follow up. Doing well. Symptoms of cervicalgia have improved with PT.  Over last few days, having some pain left posterior hip which radiates down back of left leg. Denies weakness in left leg. Denies trauma to leg or hip. Denies numbness left leg. Has been taking ibuprofen and performing PT exercises with no improvement.  In regards to insomnia, notes significant improvement with use of Trazodone. Denies any daytime somnolence.   In regards to HTN, reports full compliance with lisinopril. Does not regularly check BP. No chest pain, headache, palp.  Outpatient Encounter Prescriptions as of 05/31/2012  Medication Sig Dispense Refill  . cetirizine (ZYRTEC) 10 MG tablet Take 1 tablet (10 mg total) by mouth daily.  30 tablet  6  . Cholecalciferol (VITAMIN D-3) 5000 UNITS TABS Take 1 tablet by mouth daily.      . Ferrous Gluconate (IRON) 240 (27 FE) MG TABS Take 1 tablet by mouth daily.      Marland Kitchen ibuprofen (ADVIL,MOTRIN) 800 MG tablet Take 1 tablet (800 mg total) by mouth 3 (three) times daily as needed.  90 tablet  1  . lisinopril (PRINIVIL,ZESTRIL) 10 MG tablet Take 1 tablet (10 mg total) by mouth daily.  30 tablet  6  . traZODone (DESYREL) 50 MG tablet Take 1 tablet (50 mg total) by mouth at bedtime.  30 tablet  6  . [DISCONTINUED] lisinopril (PRINIVIL,ZESTRIL) 10 MG tablet Take 1 tablet (10 mg total) by mouth daily.  30 tablet  2  . [DISCONTINUED] traZODone (DESYREL) 50 MG tablet Take 1 tablet (50 mg total) by mouth at bedtime.  30 tablet  0  . cyclobenzaprine (FLEXERIL) 10 MG tablet Take 1 tablet (10 mg total) by mouth 3 (three) times daily as needed for muscle spasms.  90 tablet  0  . HYDROcodone-acetaminophen (VICODIN) 5-500 MG per tablet Take 1 tablet by mouth every 8 (eight) hours as needed for pain.  20 tablet  0  . metroNIDAZOLE  (FLAGYL) 500 MG tablet Take 4 tablets (2,000 mg total) by mouth once.  4 tablet  0  . predniSONE (STERAPRED UNI-PAK) 10 MG tablet Take 60mg  day 1 then taper by 10mg  daily  21 tablet  0   BP 140/78  Pulse 78  Temp 98.2 F (36.8 C) (Oral)  Ht 5\' 4"  (1.626 m)  Wt 174 lb 8 oz (79.153 kg)  BMI 29.95 kg/m2  SpO2 99%  Review of Systems  Constitutional: Negative for fever, chills, appetite change, fatigue and unexpected weight change.  HENT: Negative for ear pain, congestion, sore throat, trouble swallowing, neck pain, voice change and sinus pressure.   Eyes: Negative for visual disturbance.  Respiratory: Negative for cough, shortness of breath, wheezing and stridor.   Cardiovascular: Negative for chest pain, palpitations and leg swelling.  Gastrointestinal: Negative for nausea, vomiting, abdominal pain, diarrhea, constipation, blood in stool, abdominal distention and anal bleeding.  Genitourinary: Negative for dysuria and flank pain.  Musculoskeletal: Positive for myalgias and arthralgias. Negative for gait problem.  Skin: Negative for color change and rash.  Neurological: Negative for dizziness and headaches.  Hematological: Negative for adenopathy. Does not bruise/bleed easily.  Psychiatric/Behavioral: Negative for suicidal ideas, sleep disturbance and dysphoric mood. The patient is not nervous/anxious.        Objective:   Physical Exam  Constitutional: She  is oriented to person, place, and time. She appears well-developed and well-nourished. No distress.  HENT:  Head: Normocephalic and atraumatic.  Right Ear: External ear normal.  Left Ear: External ear normal.  Nose: Nose normal.  Mouth/Throat: Oropharynx is clear and moist. No oropharyngeal exudate.  Eyes: Conjunctivae normal are normal. Pupils are equal, round, and reactive to light. Right eye exhibits no discharge. Left eye exhibits no discharge. No scleral icterus.  Neck: Normal range of motion. Neck supple. No tracheal  deviation present. No thyromegaly present.  Cardiovascular: Normal rate, regular rhythm, normal heart sounds and intact distal pulses.  Exam reveals no gallop and no friction rub.   No murmur heard. Pulmonary/Chest: Effort normal and breath sounds normal. No respiratory distress. She has no wheezes. She has no rales. She exhibits no tenderness.  Musculoskeletal: Normal range of motion. She exhibits no edema and no tenderness.       Legs: Lymphadenopathy:    She has no cervical adenopathy.  Neurological: She is alert and oriented to person, place, and time. No cranial nerve deficit. She exhibits normal muscle tone. Coordination normal.  Skin: Skin is warm and dry. No rash noted. She is not diaphoretic. No erythema. No pallor.  Psychiatric: She has a normal mood and affect. Her behavior is normal. Judgment and thought content normal.          Assessment & Plan:

## 2012-05-31 NOTE — Assessment & Plan Note (Signed)
Symptoms markedly improved with trazodone. Will continue.

## 2012-05-31 NOTE — Assessment & Plan Note (Addendum)
BP slightly elevated today, but generally has been well controlled on current medication. Will continue. Will check renal function with labs today (all labs drawn through pt work).

## 2012-06-07 ENCOUNTER — Telehealth: Payer: Self-pay | Admitting: Internal Medicine

## 2012-06-07 DIAGNOSIS — M5430 Sciatica, unspecified side: Secondary | ICD-10-CM

## 2012-06-07 NOTE — Telephone Encounter (Signed)
The medication for her back is not doing any good. She was told to call the physician to inform her that the medication is not working.

## 2012-06-07 NOTE — Telephone Encounter (Signed)
Lets set up referral to Sports Medicine. I will place order.

## 2012-06-09 ENCOUNTER — Ambulatory Visit: Payer: PRIVATE HEALTH INSURANCE | Admitting: Family Medicine

## 2012-06-17 ENCOUNTER — Ambulatory Visit (INDEPENDENT_AMBULATORY_CARE_PROVIDER_SITE_OTHER)
Admission: RE | Admit: 2012-06-17 | Discharge: 2012-06-17 | Disposition: A | Discharge status: Home / Self Care | Payer: PRIVATE HEALTH INSURANCE | Source: Ambulatory Visit | Attending: Family Medicine | Admitting: Family Medicine

## 2012-06-17 ENCOUNTER — Ambulatory Visit (INDEPENDENT_AMBULATORY_CARE_PROVIDER_SITE_OTHER): Payer: PRIVATE HEALTH INSURANCE | Admitting: Family Medicine

## 2012-06-17 ENCOUNTER — Telehealth: Payer: Self-pay | Admitting: Internal Medicine

## 2012-06-17 ENCOUNTER — Encounter: Payer: Self-pay | Admitting: Family Medicine

## 2012-06-17 VITALS — BP 120/72 | HR 87 | Temp 98.2°F | Ht 64.0 in | Wt 172.8 lb

## 2012-06-17 DIAGNOSIS — M5430 Sciatica, unspecified side: Secondary | ICD-10-CM

## 2012-06-17 DIAGNOSIS — G5700 Lesion of sciatic nerve, unspecified lower limb: Secondary | ICD-10-CM

## 2012-06-17 DIAGNOSIS — G5702 Lesion of sciatic nerve, left lower limb: Secondary | ICD-10-CM

## 2012-06-17 NOTE — Telephone Encounter (Signed)
Labs from 12/23 looked fine except that blood sugar was slightly high, consistent with borderline diabetes.  We can review them at next visit. I would like to see her back within 3 months.

## 2012-06-17 NOTE — Patient Instructions (Signed)
F/u 6 weeks with Dr. Patsy Lager

## 2012-06-17 NOTE — Telephone Encounter (Signed)
LMOVM for pt to return call 
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
## 2012-06-18 NOTE — Telephone Encounter (Signed)
Pt.notified

## 2012-06-18 NOTE — Telephone Encounter (Signed)
LMOVM for pt to return call 

## 2012-06-28 ENCOUNTER — Encounter: Payer: Self-pay | Admitting: Internal Medicine

## 2012-06-28 ENCOUNTER — Ambulatory Visit (INDEPENDENT_AMBULATORY_CARE_PROVIDER_SITE_OTHER): Payer: BC Managed Care – HMO | Admitting: Internal Medicine

## 2012-06-28 VITALS — BP 150/90 | HR 81 | Temp 98.1°F | Ht 64.0 in | Wt 173.0 lb

## 2012-06-28 DIAGNOSIS — M542 Cervicalgia: Secondary | ICD-10-CM | POA: Insufficient documentation

## 2012-06-28 DIAGNOSIS — M545 Low back pain, unspecified: Secondary | ICD-10-CM

## 2012-06-28 DIAGNOSIS — R739 Hyperglycemia, unspecified: Secondary | ICD-10-CM

## 2012-06-28 DIAGNOSIS — E119 Type 2 diabetes mellitus without complications: Secondary | ICD-10-CM | POA: Insufficient documentation

## 2012-06-28 DIAGNOSIS — R7309 Other abnormal glucose: Secondary | ICD-10-CM

## 2012-06-28 MED ORDER — TRAMADOL HCL 50 MG PO TABS: 50.0000 mg | ORAL_TABLET | Freq: Three times a day (TID) | ORAL | Status: DC | PRN

## 2012-06-28 NOTE — Assessment & Plan Note (Signed)
Elevated BG noted on recent labs. Non-fasting BG 133 and A1c 6.4%. Will plan to recheck labs in 3 months. Suspect recent elevation was related to steroid use.

## 2012-06-28 NOTE — Assessment & Plan Note (Signed)
No improvement with recent PT, NSAIDS, muscle relaxers. Plain film normal. Suspect from muscular strain related to work activities. Question if she might benefit from epidural steroid injection. Will set up eval with pain management. Will start tramadol to see if any improvement in symptoms. Follow up 1 month.

## 2012-06-28 NOTE — Progress Notes (Signed)
Subjective:    Patient ID: Melissa Garrison, female    DOB: 1961-06-18, 52 y.o.   MRN: [id number]  HPI 52 year old female with recent history of both lower and upper back pain presents for followup. In the interim since her last visit, she had plain films of her lumbar and cervical spine which were normal. She also reports having an MRI of the cervical spine and a recent months which was normal. Results of this test are not available. She was evaluated by sports medicine and underwent physical therapy for her upper and lower back. She reports no improvement with performing the exercises they recommended. She has also been taking ibuprofen and Flexeril with no improvement in her symptoms. The pain in her upper back is described as aching in the mid spine. It does not radiate. She does not have any weakness in her arms. Pain in her low back radiates down her left posterior back down her leg. She has not had any weakness in her legs. The pain is intermittent and worsened with movement.  She was also noted on recent labs to have elevated blood sugar of 133 and elevated hemoglobin A1c at 6.4%. She has never been diagnosed with diabetes.  Outpatient Encounter Prescriptions as of 06/28/2012  Medication Sig Dispense Refill  . cetirizine (ZYRTEC) 10 MG tablet Take 1 tablet (10 mg total) by mouth daily.  30 tablet  6  . Cholecalciferol (VITAMIN D-3) 5000 UNITS TABS Take 1 tablet by mouth daily.      . cyclobenzaprine (FLEXERIL) 10 MG tablet Take 1 tablet (10 mg total) by mouth 3 (three) times daily as needed for muscle spasms.  90 tablet  0  . Ferrous Gluconate (IRON) 240 (27 FE) MG TABS Take 1 tablet by mouth daily.      Marland Kitchen ibuprofen (ADVIL,MOTRIN) 800 MG tablet Take 1 tablet (800 mg total) by mouth 3 (three) times daily as needed.  90 tablet  1  . lisinopril (PRINIVIL,ZESTRIL) 10 MG tablet Take 1 tablet (10 mg total) by mouth daily.  30 tablet  6  . metroNIDAZOLE (FLAGYL) 500 MG tablet Take 4 tablets (2,000 mg  total) by mouth once.  4 tablet  0  . traZODone (DESYREL) 50 MG tablet Take 1 tablet (50 mg total) by mouth at bedtime.  30 tablet  6  . traMADol (ULTRAM) 50 MG tablet Take 1 tablet (50 mg total) by mouth every 8 (eight) hours as needed for pain.  90 tablet  3  . [DISCONTINUED] HYDROcodone-acetaminophen (VICODIN) 5-500 MG per tablet Take 1 tablet by mouth every 8 (eight) hours as needed for pain.  20 tablet  0  . [DISCONTINUED] predniSONE (STERAPRED UNI-PAK) 10 MG tablet Take 60mg  day 1 then taper by 10mg  daily  21 tablet  0   BP 150/90  Pulse 81  Temp 98.1 F (36.7 C) (Oral)  Ht 5\' 4"  (1.626 m)  Wt 173 lb (78.472 kg)  BMI 29.70 kg/m2  SpO2 97%  Review of Systems  Constitutional: Negative for fever, chills, appetite change, fatigue and unexpected weight change.  HENT: Negative for ear pain, congestion, sore throat, trouble swallowing, neck pain, voice change and sinus pressure.   Eyes: Negative for visual disturbance.  Respiratory: Negative for cough, shortness of breath, wheezing and stridor.   Cardiovascular: Negative for chest pain, palpitations and leg swelling.  Gastrointestinal: Negative for nausea, vomiting, abdominal pain, diarrhea, constipation, blood in stool, abdominal distention and anal bleeding.  Genitourinary: Negative for dysuria and flank pain.  Musculoskeletal: Positive for myalgias, back pain and arthralgias. Negative for gait problem.  Skin: Negative for color change and rash.  Neurological: Negative for dizziness and headaches.  Hematological: Negative for adenopathy. Does not bruise/bleed easily.  Psychiatric/Behavioral: Negative for suicidal ideas, sleep disturbance and dysphoric mood. The patient is not nervous/anxious.        Objective:   Physical Exam  Constitutional: She is oriented to person, place, and time. She appears well-developed and well-nourished. No distress.  HENT:  Head: Normocephalic and atraumatic.  Right Ear: External ear normal.  Left  Ear: External ear normal.  Nose: Nose normal.  Mouth/Throat: Oropharynx is clear and moist. No oropharyngeal exudate.  Eyes: Conjunctivae normal are normal. Pupils are equal, round, and reactive to light. Right eye exhibits no discharge. Left eye exhibits no discharge. No scleral icterus.  Neck: Normal range of motion. Neck supple. No tracheal deviation present. No thyromegaly present.  Cardiovascular: Normal rate, regular rhythm, normal heart sounds and intact distal pulses.  Exam reveals no gallop and no friction rub.   No murmur heard. Pulmonary/Chest: Effort normal and breath sounds normal. No respiratory distress. She has no wheezes. She has no rales. She exhibits no tenderness.  Musculoskeletal: Normal range of motion. She exhibits no edema and no tenderness.       Cervical back: She exhibits tenderness, bony tenderness and pain. She exhibits no spasm.       Lumbar back: She exhibits tenderness, bony tenderness and pain.       Back:  Lymphadenopathy:    She has no cervical adenopathy.  Neurological: She is alert and oriented to person, place, and time. No cranial nerve deficit. She exhibits normal muscle tone. Coordination normal.  Skin: Skin is warm and dry. No rash noted. She is not diaphoretic. No erythema. No pallor.  Psychiatric: She has a normal mood and affect. Her behavior is normal. Judgment and thought content normal.          Assessment & Plan:

## 2012-06-28 NOTE — Assessment & Plan Note (Signed)
Likely muscular strain, suspect exacerbated by work activities. No improvement with PT, NSAIDS and muscle relaxers. Will request copy of recent MRI cervical spine. Plain film was normal. Will set up referral to pain management given persistence of symptoms.

## 2012-07-17 ENCOUNTER — Ambulatory Visit: Payer: Self-pay | Admitting: Physical Medicine and Rehabilitation

## 2012-08-02 ENCOUNTER — Ambulatory Visit: Payer: BC Managed Care – HMO | Admitting: Internal Medicine

## 2012-08-27 ENCOUNTER — Ambulatory Visit: Payer: BC Managed Care – HMO | Admitting: Internal Medicine

## 2012-09-10 ENCOUNTER — Encounter: Payer: Self-pay | Admitting: Internal Medicine

## 2012-09-10 ENCOUNTER — Ambulatory Visit (INDEPENDENT_AMBULATORY_CARE_PROVIDER_SITE_OTHER): Payer: BC Managed Care – HMO | Admitting: Internal Medicine

## 2012-09-10 VITALS — BP 120/78 | HR 72 | Temp 98.0°F | Wt 168.0 lb

## 2012-09-10 DIAGNOSIS — M545 Low back pain, unspecified: Secondary | ICD-10-CM

## 2012-09-10 DIAGNOSIS — I10 Essential (primary) hypertension: Secondary | ICD-10-CM

## 2012-09-10 DIAGNOSIS — G4700 Insomnia, unspecified: Secondary | ICD-10-CM

## 2012-09-10 DIAGNOSIS — M542 Cervicalgia: Secondary | ICD-10-CM

## 2012-09-10 MED ORDER — TRAZODONE HCL 50 MG PO TABS: 50.0000 mg | ORAL_TABLET | Freq: Every day | ORAL | Status: DC

## 2012-09-10 NOTE — Assessment & Plan Note (Signed)
Symptoms well controlled with anti-inflammatories and Robaxin. Will continue.

## 2012-09-10 NOTE — Progress Notes (Signed)
Subjective:    Patient ID: Melissa Garrison, female    DOB: 04/20/1961, 52 y.o.   MRN: [id number]  HPI 52 year old female with history of hypertension, hepatitis C, and recent episodes of upper and lower back pain presents for followup. She was seen by orthopedic surgery and started on Robaxin for cervical back pain. She reports that symptoms have improved on this medication. She continues to have some intermittent aching in her upper and lower back, mostly associated with her job. However, in comparison to previous she feels that this is much improved.  In regards to hypertension, she reports compliance with lisinopril. She denies any headache, palpitations, chest pain.  In regards to insomnia, she reports that symptoms have been well-controlled with trazodone.  Outpatient Encounter Prescriptions as of 09/10/2012  Medication Sig Dispense Refill  . cetirizine (ZYRTEC) 10 MG tablet Take 1 tablet (10 mg total) by mouth daily.  30 tablet  6  . Cholecalciferol (VITAMIN D-3) 5000 UNITS TABS Take 1 tablet by mouth daily.      Marland Kitchen ibuprofen (ADVIL,MOTRIN) 800 MG tablet Take 1 tablet (800 mg total) by mouth 3 (three) times daily as needed.  90 tablet  1  . lisinopril (PRINIVIL,ZESTRIL) 10 MG tablet Take 1 tablet (10 mg total) by mouth daily.  30 tablet  6  . methocarbamol (ROBAXIN) 750 MG tablet Take 750 mg by mouth 3 (three) times daily. Take 1/2 -1 tablet three times a day      . traMADol (ULTRAM) 50 MG tablet Take 1 tablet (50 mg total) by mouth every 8 (eight) hours as needed for pain.  90 tablet  3  . traZODone (DESYREL) 50 MG tablet Take 1 tablet (50 mg total) by mouth at bedtime.  30 tablet  6   No facility-administered encounter medications on file as of 09/10/2012.   BP 120/78  Pulse 72  Temp(Src) 98 F (36.7 C) (Oral)  Wt 168 lb (76.204 kg)  BMI 28.82 kg/m2  SpO2 98%  Review of Systems  Constitutional: Negative for fever, chills, appetite change, fatigue and unexpected weight change.   HENT: Negative for ear pain, congestion, sore throat, trouble swallowing, neck pain, voice change and sinus pressure.   Eyes: Negative for visual disturbance.  Respiratory: Negative for cough, shortness of breath, wheezing and stridor.   Cardiovascular: Negative for chest pain, palpitations and leg swelling.  Gastrointestinal: Negative for nausea, vomiting, abdominal pain, diarrhea, constipation, blood in stool, abdominal distention and anal bleeding.  Genitourinary: Negative for dysuria and flank pain.  Musculoskeletal: Positive for myalgias, back pain and arthralgias. Negative for gait problem.  Skin: Negative for color change and rash.  Neurological: Negative for dizziness and headaches.  Hematological: Negative for adenopathy. Does not bruise/bleed easily.  Psychiatric/Behavioral: Negative for suicidal ideas, sleep disturbance and dysphoric mood. The patient is not nervous/anxious.        Objective:   Physical Exam  Constitutional: She is oriented to person, place, and time. She appears well-developed and well-nourished. No distress.  HENT:  Head: Normocephalic and atraumatic.  Right Ear: External ear normal.  Left Ear: External ear normal.  Nose: Nose normal.  Mouth/Throat: Oropharynx is clear and moist. No oropharyngeal exudate.  Eyes: Conjunctivae are normal. Pupils are equal, round, and reactive to light. Right eye exhibits no discharge. Left eye exhibits no discharge. No scleral icterus.  Neck: Normal range of motion. Neck supple. No tracheal deviation present. No thyromegaly present.  Cardiovascular: Normal rate, regular rhythm, normal heart sounds and intact distal pulses.  Exam reveals no gallop and no friction rub.   No murmur heard. Pulmonary/Chest: Effort normal and breath sounds normal. No respiratory distress. She has no wheezes. She has no rales. She exhibits no tenderness.  Musculoskeletal: Normal range of motion. She exhibits no edema and no tenderness.        Cervical back: She exhibits normal range of motion and no tenderness.       Lumbar back: She exhibits normal range of motion and no tenderness.  Lymphadenopathy:    She has no cervical adenopathy.  Neurological: She is alert and oriented to person, place, and time. No cranial nerve deficit. She exhibits normal muscle tone. Coordination normal.  Skin: Skin is warm and dry. No rash noted. She is not diaphoretic. No erythema. No pallor.  Psychiatric: She has a normal mood and affect. Her behavior is normal. Judgment and thought content normal.          Assessment & Plan:

## 2012-09-10 NOTE — Assessment & Plan Note (Signed)
BP Readings from Last 3 Encounters:  09/10/12 120/78  06/28/12 150/90  06/17/12 120/72   Pressure well-controlled on current medication. Will continue. Will request recent labwork drawn by her employer.

## 2012-09-10 NOTE — Assessment & Plan Note (Signed)
Symptoms well controlled with trazodone. Will continue. 

## 2012-09-10 NOTE — Assessment & Plan Note (Signed)
Symptoms improved with use of Robaxin. Will continue. Followup with orthopedic surgery next month.

## 2012-09-20 ENCOUNTER — Encounter: Payer: Self-pay | Admitting: Internal Medicine

## 2012-12-08 ENCOUNTER — Encounter: Payer: Self-pay | Admitting: Internal Medicine

## 2012-12-08 ENCOUNTER — Ambulatory Visit (INDEPENDENT_AMBULATORY_CARE_PROVIDER_SITE_OTHER): Payer: BC Managed Care – HMO | Admitting: Internal Medicine

## 2012-12-08 VITALS — BP 120/80 | HR 76 | Temp 98.0°F | Ht 64.0 in | Wt 169.0 lb

## 2012-12-08 DIAGNOSIS — Z1211 Encounter for screening for malignant neoplasm of colon: Secondary | ICD-10-CM

## 2012-12-08 DIAGNOSIS — I10 Essential (primary) hypertension: Secondary | ICD-10-CM

## 2012-12-08 DIAGNOSIS — Z0000 Encounter for general adult medical examination without abnormal findings: Secondary | ICD-10-CM

## 2012-12-08 MED ORDER — LISINOPRIL 10 MG PO TABS: 10.0000 mg | ORAL_TABLET | Freq: Every day | ORAL | Status: DC

## 2012-12-08 NOTE — Assessment & Plan Note (Signed)
General medical exam including breast exam normal today. Pap deferred given Pap smear was normal, HPV negative in 2013. Plan to repeat Pap in 2016. Mammogram ordered. Colonoscopy ordered. Encourage continued efforts at healthy diet and regular physical activity. Reviewed recent labs from her employer including lipid profile which was normal CMP and blood counts which were normal.   

## 2012-12-08 NOTE — Progress Notes (Signed)
Subjective:    Patient ID: Melissa Garrison, female    DOB: 06-02-1961, 52 y.o.   MRN: [id number]  HPI 52 year old female with history of hypertension presents for annual exam. Patient reports she is feeling well. No concerns today. She is compliant with medication. She did not bring record of her blood pressure today. She denies any headache, chest pain, palpitations.  In regards to health maintenance, she is up-to-date on Pap smear which was normal, HPV negative in 2013. Mammogram is due. Colonoscopy is due. Immunizations are up-to-date. She has been trying to follow a healthy diet and get regular physical activity with goal of losing weight.  Outpatient Encounter Prescriptions as of 12/08/2012  Medication Sig Dispense Refill  . cetirizine (ZYRTEC) 10 MG tablet Take 1 tablet (10 mg total) by mouth daily.  30 tablet  6  . Cholecalciferol (VITAMIN D-3) 5000 UNITS TABS Take 1 tablet by mouth daily.      Marland Kitchen ibuprofen (ADVIL,MOTRIN) 800 MG tablet Take 1 tablet (800 mg total) by mouth 3 (three) times daily as needed.  90 tablet  1  . lisinopril (PRINIVIL,ZESTRIL) 10 MG tablet Take 1 tablet (10 mg total) by mouth daily.  90 tablet  4  . methocarbamol (ROBAXIN) 750 MG tablet Take 750 mg by mouth 3 (three) times daily. Take 1/2 -1 tablet three times a day      . traMADol (ULTRAM) 50 MG tablet Take 1 tablet (50 mg total) by mouth every 8 (eight) hours as needed for pain.  90 tablet  3  . traZODone (DESYREL) 50 MG tablet Take 1 tablet (50 mg total) by mouth at bedtime.  30 tablet  6  . [DISCONTINUED] lisinopril (PRINIVIL,ZESTRIL) 10 MG tablet Take 1 tablet (10 mg total) by mouth daily.  30 tablet  6  . [DISCONTINUED] Ferrous Gluconate (IRON) 240 (27 FE) MG TABS Take 1 tablet by mouth daily.       No facility-administered encounter medications on file as of 12/08/2012.   BP 120/80  Pulse 76  Temp(Src) 98 F (36.7 C) (Oral)  Ht 5\' 4"  (1.626 m)  Wt 169 lb (76.658 kg)  BMI 28.99 kg/m2  SpO2  98%  Review of Systems  Constitutional: Negative for fever, chills, appetite change, fatigue and unexpected weight change.  HENT: Negative for ear pain, congestion, sore throat, trouble swallowing, neck pain, voice change and sinus pressure.   Eyes: Negative for visual disturbance.  Respiratory: Negative for cough, shortness of breath, wheezing and stridor.   Cardiovascular: Negative for chest pain, palpitations and leg swelling.  Gastrointestinal: Negative for nausea, vomiting, abdominal pain, diarrhea, constipation, blood in stool, abdominal distention and anal bleeding.  Genitourinary: Negative for dysuria and flank pain.  Musculoskeletal: Negative for myalgias, arthralgias and gait problem.  Skin: Negative for color change and rash.  Neurological: Negative for dizziness and headaches.  Hematological: Negative for adenopathy. Does not bruise/bleed easily.  Psychiatric/Behavioral: Negative for suicidal ideas, sleep disturbance and dysphoric mood. The patient is not nervous/anxious.        Objective:   Physical Exam  Constitutional: She is oriented to person, place, and time. She appears well-developed and well-nourished. No distress.  HENT:  Head: Normocephalic and atraumatic.  Right Ear: External ear normal.  Left Ear: External ear normal.  Nose: Nose normal.  Mouth/Throat: Oropharynx is clear and moist. No oropharyngeal exudate.  Eyes: Conjunctivae are normal. Pupils are equal, round, and reactive to light. Right eye exhibits no discharge. Left eye exhibits no discharge. No scleral  icterus.  Neck: Normal range of motion. Neck supple. No tracheal deviation present. No thyromegaly present.  Cardiovascular: Normal rate, regular rhythm, normal heart sounds and intact distal pulses.  Exam reveals no gallop and no friction rub.   No murmur heard. Pulmonary/Chest: Effort normal and breath sounds normal. No accessory muscle usage. Not tachypneic. No respiratory distress. She has no  decreased breath sounds. She has no wheezes. She has no rales. She exhibits no tenderness. Right breast exhibits no inverted nipple, no mass, no nipple discharge, no skin change and no tenderness. Left breast exhibits no inverted nipple, no mass, no nipple discharge, no skin change and no tenderness. Breasts are symmetrical.  Abdominal: Soft. Bowel sounds are normal. She exhibits no distension and no mass. There is no tenderness. There is no rebound and no guarding.  Musculoskeletal: Normal range of motion. She exhibits no edema and no tenderness.  Lymphadenopathy:    She has no cervical adenopathy.  Neurological: She is alert and oriented to person, place, and time. No cranial nerve deficit. She exhibits normal muscle tone. Coordination normal.  Skin: Skin is warm and dry. No rash noted. She is not diaphoretic. No erythema. No pallor.  Psychiatric: She has a normal mood and affect. Her behavior is normal. Judgment and thought content normal.          Assessment & Plan:

## 2013-06-13 ENCOUNTER — Ambulatory Visit: Payer: BC Managed Care – HMO | Admitting: Internal Medicine

## 2013-06-24 ENCOUNTER — Encounter: Payer: Self-pay | Admitting: Internal Medicine

## 2013-06-24 ENCOUNTER — Ambulatory Visit (INDEPENDENT_AMBULATORY_CARE_PROVIDER_SITE_OTHER): Payer: BC Managed Care – HMO | Admitting: Internal Medicine

## 2013-06-24 VITALS — BP 144/80 | HR 71 | Temp 98.1°F | Wt 172.0 lb

## 2013-06-24 DIAGNOSIS — I10 Essential (primary) hypertension: Secondary | ICD-10-CM

## 2013-06-24 DIAGNOSIS — B182 Chronic viral hepatitis C: Secondary | ICD-10-CM

## 2013-06-24 MED ORDER — LISINOPRIL 10 MG PO TABS: 10.0000 mg | ORAL_TABLET | Freq: Every day | ORAL | Status: DC

## 2013-06-24 MED ORDER — HYDROXYZINE HCL 10 MG PO TABS: 10.0000 mg | ORAL_TABLET | Freq: Three times a day (TID) | ORAL | Status: DC | PRN

## 2013-06-24 NOTE — Progress Notes (Signed)
Pre-visit discussion using our clinic review tool. No additional management support is needed unless otherwise documented below in the visit note.  

## 2013-06-24 NOTE — Progress Notes (Signed)
Subjective:    Patient ID: Melissa Garrison, female    DOB: 01/08/1961, 53 y.o.   MRN: [id number]  HPI 53YO female with h/o hypertension, Hep C presents for followup. She reports she is generally feeling well. Compliant with medications. No concerns today. Planning to schedule her colonoscopy for colon cancer screening.  Outpatient Encounter Prescriptions as of 06/24/2013  Medication Sig  . cetirizine (ZYRTEC) 10 MG tablet Take 1 tablet (10 mg total) by mouth daily.  . Cholecalciferol (VITAMIN D-3) 5000 UNITS TABS Take 1 tablet by mouth daily.  . hydrOXYzine (ATARAX/VISTARIL) 10 MG tablet Take 1 tablet (10 mg total) by mouth every 8 (eight) hours as needed. Take 1 tablet by mouth at bedtime as needed for itching  . ibuprofen (ADVIL,MOTRIN) 800 MG tablet Take 1 tablet (800 mg total) by mouth 3 (three) times daily as needed.  Marland Kitchen lisinopril (PRINIVIL,ZESTRIL) 10 MG tablet Take 1 tablet (10 mg total) by mouth daily.  . traZODone (DESYREL) 50 MG tablet Take 1 tablet (50 mg total) by mouth at bedtime.  . methocarbamol (ROBAXIN) 750 MG tablet Take 750 mg by mouth 3 (three) times daily. Take 1/2 -1 tablet three times a day  . traMADol (ULTRAM) 50 MG tablet Take 1 tablet (50 mg total) by mouth every 8 (eight) hours as needed for pain.   BP 144/80  Pulse 71  Temp(Src) 98.1 F (36.7 C) (Oral)  Wt 172 lb (78.019 kg)  SpO2 99%  Review of Systems  Constitutional: Negative for fever, chills, appetite change, fatigue and unexpected weight change.  HENT: Negative for congestion, ear pain, sinus pressure, sore throat, trouble swallowing and voice change.   Eyes: Negative for visual disturbance.  Respiratory: Negative for cough, shortness of breath, wheezing and stridor.   Cardiovascular: Negative for chest pain, palpitations and leg swelling.  Gastrointestinal: Negative for nausea, vomiting, abdominal pain, diarrhea, constipation, blood in stool, abdominal distention and anal bleeding.  Genitourinary:  Negative for dysuria and flank pain.  Musculoskeletal: Negative for arthralgias, gait problem, myalgias and neck pain.  Skin: Negative for color change and rash.  Neurological: Negative for dizziness and headaches.  Hematological: Negative for adenopathy. Does not bruise/bleed easily.  Psychiatric/Behavioral: Negative for suicidal ideas, sleep disturbance and dysphoric mood. The patient is not nervous/anxious.        Objective:   Physical Exam  Constitutional: She is oriented to person, place, and time. She appears well-developed and well-nourished. No distress.  HENT:  Head: Normocephalic and atraumatic.  Right Ear: External ear normal.  Left Ear: External ear normal.  Nose: Nose normal.  Mouth/Throat: Oropharynx is clear and moist. No oropharyngeal exudate.  Eyes: Conjunctivae are normal. Pupils are equal, round, and reactive to light. Right eye exhibits no discharge. Left eye exhibits no discharge. No scleral icterus.  Neck: Normal range of motion. Neck supple. No tracheal deviation present. No thyromegaly present.  Cardiovascular: Normal rate, regular rhythm, normal heart sounds and intact distal pulses.  Exam reveals no gallop and no friction rub.   No murmur heard. Pulmonary/Chest: Effort normal and breath sounds normal. No accessory muscle usage. Not tachypneic. No respiratory distress. She has no decreased breath sounds. She has no wheezes. She has no rhonchi. She has no rales. She exhibits no tenderness.  Abdominal: Soft. Bowel sounds are normal. She exhibits no distension and no mass. There is no tenderness. There is no rebound and no guarding.  Musculoskeletal: Normal range of motion. She exhibits no edema and no tenderness.  Lymphadenopathy:  She has no cervical adenopathy.  Neurological: She is alert and oriented to person, place, and time. No cranial nerve deficit. She exhibits normal muscle tone. Coordination normal.  Skin: Skin is warm and dry. No rash noted. She is not  diaphoretic. No erythema. No pallor.  Psychiatric: She has a normal mood and affect. Her behavior is normal. Judgment and thought content normal.          Assessment & Plan:

## 2013-06-24 NOTE — Assessment & Plan Note (Signed)
Continue to follow with ID physician. Will check LFTs with labs today.

## 2013-06-24 NOTE — Assessment & Plan Note (Signed)
BP Readings from Last 3 Encounters:  06/24/13 144/80  12/08/12 120/80  09/10/12 120/78   BP generally well controlled on lisinopril. Will check renal function with labs today.

## 2013-10-05 ENCOUNTER — Ambulatory Visit: Payer: Self-pay | Admitting: Internal Medicine

## 2013-10-05 LAB — HM MAMMOGRAPHY

## 2013-10-06 ENCOUNTER — Encounter: Payer: Self-pay | Admitting: *Deleted

## 2013-12-09 LAB — BASIC METABOLIC PANEL
BUN: 19 mg/dL (ref 4–21)
CREATININE: 0.6 mg/dL (ref <=1.1)
GLUCOSE: 118 mg/dL
POTASSIUM: 4.3 mmol/L (ref 3.4–5.3)
Sodium: 140 mmol/L (ref 137–147)

## 2013-12-09 LAB — CBC AND DIFFERENTIAL
HCT: 38 % (ref 36–46)
Hemoglobin: 12.3 g/dL (ref 12.0–16.0)
NEUTROS ABS: 3 /uL
Platelets: 269 10*3/uL (ref 150–399)
WBC: 6.5 10^3/mL

## 2013-12-09 LAB — HEPATIC FUNCTION PANEL
ALK PHOS: 61 U/L (ref 25–125)
ALT: 8 U/L (ref 7–35)
AST: 16 U/L (ref 13–35)
Bilirubin, Total: 0.3 mg/dL

## 2013-12-09 LAB — LIPID PANEL
CHOLESTEROL: 165 mg/dL (ref 0–200)
HDL: 63 mg/dL (ref 35–70)
LDL Cholesterol: 85 mg/dL
LDL/HDL RATIO: 2.6
TRIGLYCERIDES: 85 mg/dL (ref 40–160)

## 2013-12-09 LAB — HEMOGLOBIN A1C: HEMOGLOBIN A1C: 6.2 % — AB (ref 4.0–6.0)

## 2013-12-09 LAB — TSH: TSH: 1.62 u[IU]/mL (ref <=5.90)

## 2013-12-30 ENCOUNTER — Encounter: Payer: Self-pay | Admitting: Internal Medicine

## 2013-12-30 ENCOUNTER — Encounter (INDEPENDENT_AMBULATORY_CARE_PROVIDER_SITE_OTHER): Payer: Self-pay

## 2013-12-30 ENCOUNTER — Ambulatory Visit (INDEPENDENT_AMBULATORY_CARE_PROVIDER_SITE_OTHER): Payer: BC Managed Care – HMO | Admitting: Internal Medicine

## 2013-12-30 VITALS — BP 124/62 | HR 78 | Temp 98.3°F | Ht 63.75 in | Wt 172.5 lb

## 2013-12-30 DIAGNOSIS — J3089 Other allergic rhinitis: Secondary | ICD-10-CM

## 2013-12-30 DIAGNOSIS — J302 Other seasonal allergic rhinitis: Secondary | ICD-10-CM

## 2013-12-30 DIAGNOSIS — I10 Essential (primary) hypertension: Secondary | ICD-10-CM

## 2013-12-30 DIAGNOSIS — Z0000 Encounter for general adult medical examination without abnormal findings: Secondary | ICD-10-CM

## 2013-12-30 DIAGNOSIS — Z1211 Encounter for screening for malignant neoplasm of colon: Secondary | ICD-10-CM

## 2013-12-30 LAB — HM COLONOSCOPY

## 2013-12-30 MED ORDER — CETIRIZINE HCL 10 MG PO TABS
10.0000 mg | ORAL_TABLET | Freq: Every day | ORAL | Status: DC
Start: 2013-12-30 — End: 2015-01-22

## 2013-12-30 MED ORDER — HYDROXYZINE HCL 10 MG PO TABS: 10.0000 mg | ORAL_TABLET | Freq: Three times a day (TID) | ORAL | Status: DC | PRN

## 2013-12-30 MED ORDER — LISINOPRIL 10 MG PO TABS: 10.0000 mg | ORAL_TABLET | Freq: Every day | ORAL | Status: DC

## 2013-12-30 NOTE — Patient Instructions (Signed)
We will request your recent labs.  Continue healthy diet. Try to stay active 31min 3 times per week.  Follow up in 6 months or as needed.

## 2013-12-30 NOTE — Progress Notes (Signed)
Pre visit review using our clinic review tool, if applicable. No additional management support is needed unless otherwise documented below in the visit note. 

## 2013-12-30 NOTE — Progress Notes (Signed)
Subjective:    Patient ID: Melissa Garrison, female    DOB: [birth year], 53 y.o.   MRN: [id number]  HPI 53YO female presents for annual exam.  Doing well. Working night shifts. No concerns today. Trying to follow healthy diet. Limiting fried foods. Staying active both at work and outside of work, but no specific exercise.    Review of Systems  Constitutional: Negative for fever, chills, appetite change, fatigue and unexpected weight change.  Eyes: Negative for visual disturbance.  Respiratory: Negative for shortness of breath.   Cardiovascular: Negative for chest pain and leg swelling.  Gastrointestinal: Negative for nausea, vomiting, abdominal pain, diarrhea and constipation.  Musculoskeletal: Negative for arthralgias and myalgias.  Skin: Negative for color change and rash.  Hematological: Negative for adenopathy. Does not bruise/bleed easily.  Psychiatric/Behavioral: Negative for dysphoric mood. The patient is not nervous/anxious.        Objective:    BP 124/62  Pulse 78  Temp(Src) 98.3 F (36.8 C) (Oral)  Ht 5' 3.75" (1.619 m)  Wt 172 lb 8 oz (78.245 kg)  BMI 29.85 kg/m2  SpO2 98% Physical Exam  Constitutional: She is oriented to person, place, and time. She appears well-developed and well-nourished. No distress.  HENT:  Head: Normocephalic and atraumatic.  Right Ear: External ear normal.  Left Ear: External ear normal.  Nose: Nose normal.  Mouth/Throat: Oropharynx is clear and moist. No oropharyngeal exudate.  Eyes: Conjunctivae are normal. Pupils are equal, round, and reactive to light. Right eye exhibits no discharge. Left eye exhibits no discharge. No scleral icterus.  Neck: Normal range of motion. Neck supple. No tracheal deviation present. No thyromegaly present.  Cardiovascular: Normal rate, regular rhythm, normal heart sounds and intact distal pulses.  Exam reveals no gallop and no friction rub.   No murmur heard. Pulmonary/Chest: Effort normal and breath  sounds normal. No accessory muscle usage. Not tachypneic. No respiratory distress. She has no decreased breath sounds. She has no wheezes. She has no rales. She exhibits no tenderness. Right breast exhibits no inverted nipple, no mass, no nipple discharge, no skin change and no tenderness. Left breast exhibits no inverted nipple, no mass, no nipple discharge, no skin change and no tenderness. Breasts are symmetrical.  Abdominal: Soft. Bowel sounds are normal. She exhibits no distension and no mass. There is no tenderness. There is no rebound and no guarding.  Musculoskeletal: Normal range of motion. She exhibits no edema and no tenderness.  Lymphadenopathy:    She has no cervical adenopathy.  Neurological: She is alert and oriented to person, place, and time. No cranial nerve deficit. She exhibits normal muscle tone. Coordination normal.  Skin: Skin is warm and dry. No rash noted. She is not diaphoretic. No erythema. No pallor.  Psychiatric: She has a normal mood and affect. Her behavior is normal. Judgment and thought content normal.          Assessment & Plan:   Problem List Items Addressed This Visit     Unprioritized   Hypertension      BP Readings from Last 3 Encounters:  12/30/13 124/62  06/24/13 144/80  12/08/12 120/80   BP well controlled on Lisinopril. Will request recent renal function.    Relevant Medications      lisinopril (PRINIVIL,ZESTRIL) tablet   Routine general medical examination at a health care facility - Primary     General medical exam including breast exam normal today. Pap deferred given Pap smear was normal, HPV negative in 2013. Plan  to repeat Pap in 2016. Mammogram ordered. Colonoscopy ordered. Encourage continued efforts at healthy diet and regular physical activity. Reviewed recent labs from her employer including lipid profile which was normal CMP and blood counts which were normal.      Screening for colon cancer   Relevant Orders      Ambulatory  referral to Gastroenterology    Other Visit Diagnoses   Other seasonal allergic rhinitis        Relevant Medications       cetirizine (ZYRTEC) tablet        Return in about 6 months (around 07/02/2014) for Recheck.

## 2013-12-30 NOTE — Assessment & Plan Note (Signed)
BP Readings from Last 3 Encounters:  12/30/13 124/62  06/24/13 144/80  12/08/12 120/80   BP well controlled on Lisinopril. Will request recent renal function.

## 2013-12-30 NOTE — Assessment & Plan Note (Signed)
General medical exam including breast exam normal today. Pap deferred given Pap smear was normal, HPV negative in 2013. Plan to repeat Pap in 2016. Mammogram ordered. Colonoscopy ordered. Encourage continued efforts at healthy diet and regular physical activity. Reviewed recent labs from her employer including lipid profile which was normal CMP and blood counts which were normal.

## 2014-01-02 ENCOUNTER — Telehealth: Payer: Self-pay | Admitting: Internal Medicine

## 2014-01-02 NOTE — Telephone Encounter (Signed)
Relevant patient education mailed to patient.  

## 2014-01-16 ENCOUNTER — Other Ambulatory Visit: Payer: Self-pay | Admitting: *Deleted

## 2014-07-03 ENCOUNTER — Ambulatory Visit: Payer: BC Managed Care – HMO | Admitting: Internal Medicine

## 2014-07-20 ENCOUNTER — Ambulatory Visit: Payer: Self-pay | Admitting: Internal Medicine

## 2014-07-24 ENCOUNTER — Ambulatory Visit (INDEPENDENT_AMBULATORY_CARE_PROVIDER_SITE_OTHER): Payer: BLUE CROSS/BLUE SHIELD | Admitting: Internal Medicine

## 2014-07-24 ENCOUNTER — Encounter: Payer: Self-pay | Admitting: Internal Medicine

## 2014-07-24 ENCOUNTER — Telehealth: Payer: Self-pay | Admitting: Internal Medicine

## 2014-07-24 VITALS — BP 150/86 | HR 79 | Temp 98.6°F | Ht 63.75 in | Wt 171.0 lb

## 2014-07-24 DIAGNOSIS — I10 Essential (primary) hypertension: Secondary | ICD-10-CM

## 2014-07-24 NOTE — Assessment & Plan Note (Signed)
BP Readings from Last 3 Encounters:  07/24/14 150/86  12/30/13 124/62  06/24/13 144/80   BP elevated today, however has been well controlled at home. Will recheck BP in 1 week. Check renal function with labs.

## 2014-07-24 NOTE — Telephone Encounter (Signed)
emmi mailed  °

## 2014-07-24 NOTE — Patient Instructions (Addendum)
Labs today.  Nurse BP check next week.  Follow up in 6 months.

## 2014-07-24 NOTE — Progress Notes (Signed)
Pre visit review using our clinic review tool, if applicable. No additional management support is needed unless otherwise documented below in the visit note. 

## 2014-07-24 NOTE — Progress Notes (Signed)
   Subjective:    Patient ID: Melissa Garrison, female    DOB: [birth year], 54 y.o.   MRN: [id number]  HPI 54YO female presents for follow up.  Feeling well. No concerns today. Compliant with meds. BP has been well controlled at home with systolic <017.  Notes she just left the laundry mat and was upset.  Wt Readings from Last 3 Encounters:  07/24/14 171 lb (77.565 kg)  12/30/13 172 lb 8 oz (78.245 kg)  06/24/13 172 lb (78.019 kg)     BP Readings from Last 3 Encounters:  07/24/14 150/86  12/30/13 124/62  06/24/13 144/80    Past medical, surgical, family and social history per today's encounter.  Review of Systems  Constitutional: Negative for fever, chills, appetite change, fatigue and unexpected weight change.  Eyes: Negative for visual disturbance.  Respiratory: Negative for shortness of breath.   Cardiovascular: Negative for chest pain, palpitations and leg swelling.  Gastrointestinal: Negative for abdominal pain, diarrhea and constipation.  Skin: Negative for color change and rash.  Hematological: Negative for adenopathy. Does not bruise/bleed easily.  Psychiatric/Behavioral: Negative for suicidal ideas, sleep disturbance and dysphoric mood. The patient is not nervous/anxious.        Objective:    BP 150/86 mmHg  Pulse 79  Temp(Src) 98.6 F (37 C) (Oral)  Ht 5' 3.75" (1.619 m)  Wt 171 lb (77.565 kg)  BMI 29.59 kg/m2  SpO2 99% Physical Exam  Constitutional: She is oriented to person, place, and time. She appears well-developed and well-nourished. No distress.  HENT:  Head: Normocephalic and atraumatic.  Right Ear: External ear normal.  Left Ear: External ear normal.  Nose: Nose normal.  Mouth/Throat: Oropharynx is clear and moist. No oropharyngeal exudate.  Eyes: Conjunctivae are normal. Pupils are equal, round, and reactive to light. Right eye exhibits no discharge. Left eye exhibits no discharge. No scleral icterus.  Neck: Normal range of motion. Neck  supple. No tracheal deviation present. No thyromegaly present.  Cardiovascular: Normal rate, regular rhythm, normal heart sounds and intact distal pulses.  Exam reveals no gallop and no friction rub.   No murmur heard. Pulmonary/Chest: Effort normal and breath sounds normal. No respiratory distress. She has no wheezes. She has no rales. She exhibits no tenderness.  Musculoskeletal: Normal range of motion. She exhibits no edema or tenderness.  Lymphadenopathy:    She has no cervical adenopathy.  Neurological: She is alert and oriented to person, place, and time. No cranial nerve deficit. She exhibits normal muscle tone. Coordination normal.  Skin: Skin is warm and dry. No rash noted. She is not diaphoretic. No erythema. No pallor.  Psychiatric: She has a normal mood and affect. Her behavior is normal. Judgment and thought content normal.          Assessment & Plan:   Problem List Items Addressed This Visit      Unprioritized   Hypertension - Primary    BP Readings from Last 3 Encounters:  07/24/14 150/86  12/30/13 124/62  06/24/13 144/80   BP elevated today, however has been well controlled at home. Will recheck BP in 1 week. Check renal function with labs.          Return in about 6 months (around 01/22/2015) for Physical.

## 2014-08-02 LAB — HEPATIC FUNCTION PANEL
ALT: 9 U/L (ref 7–35)
AST: 16 U/L (ref 13–35)
Alkaline Phosphatase: 53 U/L (ref 25–125)
BILIRUBIN, TOTAL: 0.2 mg/dL

## 2014-08-02 LAB — BASIC METABOLIC PANEL
BUN: 11 mg/dL (ref 4–21)
Creatinine: 0.7 mg/dL (ref 0.5–1.1)
Glucose: 101 mg/dL
POTASSIUM: 4.3 mmol/L (ref 3.4–5.3)
SODIUM: 140 mmol/L (ref 137–147)

## 2014-10-11 ENCOUNTER — Ambulatory Visit
Admit: 2014-10-11 | Disposition: A | Discharge status: Home / Self Care | Payer: Self-pay | Attending: Internal Medicine | Admitting: Internal Medicine

## 2014-10-11 ENCOUNTER — Encounter: Payer: Self-pay | Admitting: *Deleted

## 2014-10-12 ENCOUNTER — Telehealth: Payer: Self-pay | Admitting: Internal Medicine

## 2014-10-12 NOTE — Telephone Encounter (Signed)
Left message for pt to return my call.

## 2014-10-12 NOTE — Telephone Encounter (Signed)
Mammogram 4/20 showed possible mass in the left breast. They requested additional views. Has this been scheduled?

## 2014-10-12 NOTE — Telephone Encounter (Signed)
Pt notified, has not heard from [facility] yet. Advised they should be contacting her within the next 1-2 days. Advised to call back if she doesn't hear back from them in a few days,  verbalized understanding

## 2014-10-17 ENCOUNTER — Ambulatory Visit
Admit: 2014-10-17 | Disposition: A | Discharge status: Home / Self Care | Payer: Self-pay | Attending: Internal Medicine | Admitting: Internal Medicine

## 2014-10-17 LAB — HM MAMMOGRAPHY: HM MAMMO: NEGATIVE

## 2014-11-01 ENCOUNTER — Encounter: Payer: Self-pay | Admitting: *Deleted

## 2014-11-14 LAB — BASIC METABOLIC PANEL
BUN: 14 mg/dL (ref 4–21)
CREATININE: 0.7 mg/dL (ref 0.5–1.1)
Glucose: 114 mg/dL
Potassium: 3.8 mmol/L (ref 3.4–5.3)
Sodium: 141 mmol/L (ref 137–147)

## 2014-11-14 LAB — CBC AND DIFFERENTIAL
HCT: 36 % (ref 36–46)
Hemoglobin: 12 g/dL (ref 12.0–16.0)
Platelets: 302 10*3/uL (ref 150–399)
WBC: 9.4 10^3/mL

## 2014-11-14 LAB — HEPATIC FUNCTION PANEL
ALT: 9 U/L (ref 7–35)
AST: 18 U/L (ref 13–35)
Alkaline Phosphatase: 56 U/L (ref 25–125)
Bilirubin, Total: 0.6 mg/dL

## 2014-11-14 LAB — HEMOGLOBIN A1C: Hgb A1c MFr Bld: 6.2 % — AB (ref 4.0–6.0)

## 2014-11-14 LAB — LIPID PANEL
CHOLESTEROL: 146 mg/dL (ref 0–200)
HDL: 69 mg/dL (ref 35–70)
LDL Cholesterol: 49 mg/dL
Triglycerides: 141 mg/dL (ref 40–160)

## 2014-11-14 LAB — TSH: TSH: 4.24 u[IU]/mL (ref 0.41–5.90)

## 2014-11-15 ENCOUNTER — Encounter: Payer: Self-pay | Admitting: *Deleted

## 2015-01-05 ENCOUNTER — Other Ambulatory Visit: Payer: Self-pay | Admitting: Internal Medicine

## 2015-01-22 ENCOUNTER — Encounter: Payer: Self-pay | Admitting: Internal Medicine

## 2015-01-22 ENCOUNTER — Other Ambulatory Visit (HOSPITAL_COMMUNITY)
Admission: RE | Admit: 2015-01-22 | Discharge: 2015-01-22 | Disposition: A | Discharge status: Home / Self Care | Payer: BLUE CROSS/BLUE SHIELD | Source: Ambulatory Visit | Attending: Internal Medicine | Admitting: Internal Medicine

## 2015-01-22 ENCOUNTER — Ambulatory Visit (INDEPENDENT_AMBULATORY_CARE_PROVIDER_SITE_OTHER): Payer: BLUE CROSS/BLUE SHIELD | Admitting: Internal Medicine

## 2015-01-22 VITALS — BP 157/80 | HR 69 | Temp 98.6°F | Ht 63.5 in | Wt 168.5 lb

## 2015-01-22 DIAGNOSIS — Z01419 Encounter for gynecological examination (general) (routine) without abnormal findings: Secondary | ICD-10-CM | POA: Insufficient documentation

## 2015-01-22 DIAGNOSIS — Z1151 Encounter for screening for human papillomavirus (HPV): Secondary | ICD-10-CM | POA: Diagnosis present

## 2015-01-22 DIAGNOSIS — Z0000 Encounter for general adult medical examination without abnormal findings: Secondary | ICD-10-CM | POA: Diagnosis not present

## 2015-01-22 MED ORDER — FLUCONAZOLE 150 MG PO TABS: 150.0000 mg | ORAL_TABLET | Freq: Once | ORAL | Status: DC

## 2015-01-22 MED ORDER — LISINOPRIL 10 MG PO TABS: 10.0000 mg | ORAL_TABLET | Freq: Every day | ORAL | Status: DC

## 2015-01-22 NOTE — Patient Instructions (Signed)

## 2015-01-22 NOTE — Progress Notes (Signed)
Pre visit review using our clinic review tool, if applicable. No additional management support is needed unless otherwise documented below in the visit note. 

## 2015-01-22 NOTE — Assessment & Plan Note (Signed)
General medical exam normal today including breast and pelvic exam except as noted. PAP pending. Mammogram UTD. Labs reviewed. Immunizations UTD. Encouraged smoking cessation. Encouraged healthy diet and exercise. BP will be followed and rechecked by NP at her office.

## 2015-01-22 NOTE — Progress Notes (Signed)
Subjective:    Patient ID: Melissa Garrison, female    DOB: 1960-07-31, 54 y.o.   MRN: [id number]  HPI 54YO female presents for annual exam.  Feeling well. No concerns today.  BP Readings from Last 3 Encounters:  01/22/15 157/80  07/24/14 150/86  12/30/13 124/62    Past medical, surgical, family and social history per today's encounter.  Review of Systems  Constitutional: Negative for fever, chills, appetite change, fatigue and unexpected weight change.  Eyes: Negative for visual disturbance.  Respiratory: Negative for shortness of breath.   Cardiovascular: Negative for chest pain and leg swelling.  Gastrointestinal: Negative for nausea, vomiting, abdominal pain, diarrhea and constipation.  Musculoskeletal: Negative for myalgias and arthralgias.  Skin: Negative for color change and rash.  Hematological: Negative for adenopathy. Does not bruise/bleed easily.  Psychiatric/Behavioral: Negative for sleep disturbance and dysphoric mood. The patient is not nervous/anxious.        Objective:    BP 157/80 mmHg  Pulse 69  Temp(Src) 98.6 F (37 C) (Oral)  Ht 5' 3.5" (1.613 m)  Wt 168 lb 8 oz (76.431 kg)  BMI 29.38 kg/m2  SpO2 100% Physical Exam  Constitutional: She is oriented to person, place, and time. She appears well-developed and well-nourished. No distress.  HENT:  Head: Normocephalic and atraumatic.  Right Ear: External ear normal.  Left Ear: External ear normal.  Nose: Nose normal.  Mouth/Throat: Oropharynx is clear and moist. No oropharyngeal exudate.  Eyes: Conjunctivae are normal. Pupils are equal, round, and reactive to light. Right eye exhibits no discharge. Left eye exhibits no discharge. No scleral icterus.  Neck: Normal range of motion. Neck supple. No tracheal deviation present. No thyromegaly present.  Cardiovascular: Normal rate, regular rhythm, normal heart sounds and intact distal pulses.  Exam reveals no gallop and no friction rub.   No murmur  heard. Pulmonary/Chest: Effort normal and breath sounds normal. No respiratory distress. She has no wheezes. She has no rales. She exhibits no tenderness.  Abdominal: Soft. Bowel sounds are normal. She exhibits no distension and no mass. There is no tenderness. There is no rebound and no guarding.  Genitourinary: Rectum normal and uterus normal. No breast swelling, tenderness, discharge or bleeding. Pelvic exam was performed with patient supine. There is no rash, tenderness or lesion on the right labia. There is no rash, tenderness or lesion on the left labia. Uterus is not enlarged and not tender. Cervix exhibits no motion tenderness, no discharge and no friability. Right adnexum displays no mass, no tenderness and no fullness. Left adnexum displays no mass, no tenderness and no fullness. No erythema or tenderness in the vagina. Vaginal discharge (white) found.  Musculoskeletal: Normal range of motion. She exhibits no edema or tenderness.  Lymphadenopathy:    She has no cervical adenopathy.  Neurological: She is alert and oriented to person, place, and time. No cranial nerve deficit. She exhibits normal muscle tone. Coordination normal.  Skin: Skin is warm and dry. No rash noted. She is not diaphoretic. No erythema. No pallor.  Psychiatric: She has a normal mood and affect. Her behavior is normal. Judgment and thought content normal.          Assessment & Plan:   Problem List Items Addressed This Visit      Unprioritized   Routine general medical examination at a health care facility - Primary    General medical exam normal today including breast and pelvic exam except as noted. PAP pending. Mammogram UTD. Labs reviewed. Immunizations  UTD. Encouraged smoking cessation. Encouraged healthy diet and exercise. BP will be followed and rechecked by NP at her office.          No Follow-up on file.

## 2015-01-22 NOTE — Addendum Note (Signed)
Addended by: Karlene Einstein D on: 01/22/2015 02:15 PM   Modules accepted: Orders

## 2015-01-23 LAB — CYTOLOGY - PAP

## 2015-04-12 ENCOUNTER — Other Ambulatory Visit: Payer: Self-pay

## 2015-04-12 ENCOUNTER — Telehealth: Payer: Self-pay | Admitting: *Deleted

## 2015-04-12 MED ORDER — LISINOPRIL 10 MG PO TABS: 10.0000 mg | ORAL_TABLET | Freq: Every day | ORAL | Status: DC

## 2015-04-12 NOTE — Telephone Encounter (Signed)
Completed request.  

## 2015-04-12 NOTE — Telephone Encounter (Signed)
Patient request a medication refill for lisinopril-thanks

## 2015-06-24 HISTORY — PX: COLONOSCOPY: SHX174

## 2015-07-25 ENCOUNTER — Ambulatory Visit: Payer: BLUE CROSS/BLUE SHIELD | Admitting: Internal Medicine

## 2015-08-07 ENCOUNTER — Encounter: Payer: Self-pay | Admitting: Internal Medicine

## 2015-08-07 ENCOUNTER — Ambulatory Visit (INDEPENDENT_AMBULATORY_CARE_PROVIDER_SITE_OTHER): Payer: BLUE CROSS/BLUE SHIELD | Admitting: Internal Medicine

## 2015-08-07 VITALS — BP 162/83 | HR 69 | Temp 98.5°F | Wt 172.5 lb

## 2015-08-07 DIAGNOSIS — E669 Obesity, unspecified: Secondary | ICD-10-CM | POA: Diagnosis not present

## 2015-08-07 DIAGNOSIS — I10 Essential (primary) hypertension: Secondary | ICD-10-CM | POA: Diagnosis not present

## 2015-08-07 MED ORDER — LISINOPRIL 20 MG PO TABS: 20.0000 mg | ORAL_TABLET | Freq: Every day | ORAL | Status: DC

## 2015-08-07 NOTE — Progress Notes (Signed)
Subjective:    Patient ID: Melissa Garrison, female    DOB: Aug 09, 1960, 55 y.o.   MRN: [id number]  HPI  55YO female presents for follow up.  HTN - Does not check BP at home. No CP, HA. Compliant with medication.  Obesity - Not following any diet program. Walks occasionally. Would like to lose weight.  Wt Readings from Last 3 Encounters:  08/07/15 172 lb 8 oz (78.245 kg)  01/22/15 168 lb 8 oz (76.431 kg)  07/24/14 171 lb (77.565 kg)   BP Readings from Last 3 Encounters:  08/07/15 162/83  01/22/15 157/80  07/24/14 150/86    Past Medical History  Diagnosis Date  . HTN (hypertension)   . Hepatitis C     Dx: 2011 Dr Blocker  . IV drug abuse     HISTORY - greater than 10 years  . Hypertension 07/11/2011  . Hepatitis C, chronic ([place]) 07/11/2011  . Allergic rhinitis 07/11/2011   Family History  Problem Relation Age of Onset  . Hypertension Mother   . Diabetes Father   . Hypertension Brother   . Breast cancer Neg Hx   . Colon cancer Neg Hx    Past Surgical History  Procedure Laterality Date  . Laparoscopic hysterectomy    . Cholecystectomy  2011  . Humerus surgery      due to fracture   Social History   Social History  . Marital Status: Single    Spouse Name: N/A  . Number of Children: 0  . Years of Education: N/A   Occupational History  . [employer] operator    Social History Main Topics  . Smoking status: Current Every Day Smoker  . Smokeless tobacco: Never Used     Comment: 4/day  . Alcohol Use: No  . Drug Use: No  . Sexual Activity: Not Asked   Other Topics Concern  . None   Social History Narrative   Lives in [place] with mother. Dog. Works at [employer], Primary school teacher.      Regular Exercise -  GYM once a week x 1 hour, stretching daily   Daily Caffeine Use:  3 times a week - coffee             Review of Systems  Constitutional: Negative for fever, chills, appetite change, fatigue and unexpected weight change.  Eyes:  Negative for visual disturbance.  Respiratory: Negative for cough and shortness of breath.   Cardiovascular: Negative for chest pain and leg swelling.  Gastrointestinal: Negative for nausea, vomiting, abdominal pain, diarrhea and constipation.  Skin: Negative for color change and rash.  Hematological: Negative for adenopathy. Does not bruise/bleed easily.  Psychiatric/Behavioral: Negative for dysphoric mood. The patient is not nervous/anxious.        Objective:    BP 162/83 mmHg  Pulse 69  Temp(Src) 98.5 F (36.9 C) (Oral)  Wt 172 lb 8 oz (78.245 kg)  SpO2 100% Physical Exam  Constitutional: She is oriented to person, place, and time. She appears well-developed and well-nourished. No distress.  HENT:  Head: Normocephalic and atraumatic.  Right Ear: External ear normal.  Left Ear: External ear normal.  Nose: Nose normal.  Mouth/Throat: Oropharynx is clear and moist. No oropharyngeal exudate.  Eyes: Conjunctivae are normal. Pupils are equal, round, and reactive to light. Right eye exhibits no discharge. Left eye exhibits no discharge. No scleral icterus.  Neck: Normal range of motion. Neck supple. No tracheal deviation present. No thyromegaly present.  Cardiovascular:  Normal rate, regular rhythm, normal heart sounds and intact distal pulses.  Exam reveals no gallop and no friction rub.   No murmur heard. Pulmonary/Chest: Effort normal and breath sounds normal. No respiratory distress. She has no wheezes. She has no rales. She exhibits no tenderness.  Musculoskeletal: Normal range of motion. She exhibits no edema or tenderness.  Lymphadenopathy:    She has no cervical adenopathy.  Neurological: She is alert and oriented to person, place, and time. No cranial nerve deficit. She exhibits normal muscle tone. Coordination normal.  Skin: Skin is warm and dry. No rash noted. She is not diaphoretic. No erythema. No pallor.  Psychiatric: She has a normal mood and affect. Her behavior is  normal. Judgment and thought content normal.          Assessment & Plan:   Problem List Items Addressed This Visit      Unprioritized   Hypertension - Primary    BP Readings from Last 3 Encounters:  08/07/15 162/83  01/22/15 157/80  07/24/14 150/86   BP elevated. Will increase Lisinopril to 20mg  daily. Recheck Cr and K in 1 week.      Relevant Medications   lisinopril (PRINIVIL,ZESTRIL) 20 MG tablet   Obesity    Wt Readings from Last 3 Encounters:  08/07/15 172 lb 8 oz (78.245 kg)  01/22/15 168 lb 8 oz (76.431 kg)  07/24/14 171 lb (77.565 kg)   Body mass index is 30.07 kg/(m^2). Encouraged healthy diet and exercise. Discussed some medications for weight loss. She will look into coverage for Saxenda.          Return in about 4 weeks (around 09/04/2015) for Recheck.

## 2015-08-07 NOTE — Assessment & Plan Note (Signed)
Wt Readings from Last 3 Encounters:  08/07/15 172 lb 8 oz (78.245 kg)  01/22/15 168 lb 8 oz (76.431 kg)  07/24/14 171 lb (77.565 kg)   Body mass index is 30.07 kg/(m^2). Encouraged healthy diet and exercise. Discussed some medications for weight loss. She will look into coverage for Saxenda.

## 2015-08-07 NOTE — Progress Notes (Signed)
Pre visit review using our clinic review tool, if applicable. No additional management support is needed unless otherwise documented below in the visit note. 

## 2015-08-07 NOTE — Patient Instructions (Addendum)
Increase Lisinopril to 20mg  daily.  Labs next week.  Look into coverage for Saxenda. [email or web address]  Consider using MyFitness Pal app to log food intake.  Set goal of 52min daily exercise.

## 2015-08-07 NOTE — Assessment & Plan Note (Signed)
BP Readings from Last 3 Encounters:  08/07/15 162/83  01/22/15 157/80  07/24/14 150/86   BP elevated. Will increase Lisinopril to 20mg  daily. Recheck Cr and K in 1 week.

## 2015-08-17 LAB — LIPID PANEL
Cholesterol: 189 mg/dL (ref 0–200)
HDL: 64 mg/dL (ref 35–70)
LDL Cholesterol: 102 mg/dL
LDl/HDL Ratio: 3
Triglycerides: 116 mg/dL (ref 40–160)

## 2015-08-17 LAB — HEPATIC FUNCTION PANEL
ALK PHOS: 57 U/L (ref 25–125)
ALT: 11 U/L (ref 7–35)
AST: 17 U/L (ref 13–35)
BILIRUBIN, TOTAL: 0.6 mg/dL

## 2015-08-17 LAB — BASIC METABOLIC PANEL
BUN: 21 mg/dL (ref 4–21)
CREATININE: 0.7 mg/dL (ref 0.5–1.1)
Glucose: 96 mg/dL
POTASSIUM: 4.1 mmol/L (ref 3.4–5.3)
SODIUM: 143 mmol/L (ref 137–147)

## 2015-08-17 LAB — TSH: TSH: 1.23 u[IU]/mL (ref 0.41–5.90)

## 2015-08-17 LAB — CBC AND DIFFERENTIAL
HCT: 38 % (ref 36–46)
HEMOGLOBIN: 12.6 g/dL (ref 12.0–16.0)
Neutrophils Absolute: 4 /uL
Platelets: 292 10*3/uL (ref 150–399)
WBC: 4.3 10*3/mL

## 2015-08-17 LAB — HEMOGLOBIN A1C: HEMOGLOBIN A1C: 6.5

## 2015-09-04 ENCOUNTER — Ambulatory Visit: Payer: BLUE CROSS/BLUE SHIELD | Admitting: Internal Medicine

## 2015-09-14 ENCOUNTER — Ambulatory Visit (INDEPENDENT_AMBULATORY_CARE_PROVIDER_SITE_OTHER): Payer: BLUE CROSS/BLUE SHIELD | Admitting: Internal Medicine

## 2015-09-14 ENCOUNTER — Encounter: Payer: Self-pay | Admitting: Internal Medicine

## 2015-09-14 VITALS — BP 148/82 | HR 71 | Temp 98.4°F | Ht 64.0 in | Wt 171.0 lb

## 2015-09-14 DIAGNOSIS — I10 Essential (primary) hypertension: Secondary | ICD-10-CM

## 2015-09-14 DIAGNOSIS — R7309 Other abnormal glucose: Secondary | ICD-10-CM

## 2015-09-14 DIAGNOSIS — E559 Vitamin D deficiency, unspecified: Secondary | ICD-10-CM

## 2015-09-14 DIAGNOSIS — R739 Hyperglycemia, unspecified: Secondary | ICD-10-CM

## 2015-09-14 MED ORDER — ERGOCALCIFEROL 1.25 MG (50000 UT) PO CAPS: 50000.0000 [IU] | ORAL_CAPSULE | ORAL | Status: DC

## 2015-09-14 NOTE — Progress Notes (Signed)
Subjective:    Patient ID: Melissa Garrison, female    DOB: 1961-06-07, 55 y.o.   MRN: [id number]  HPI  55YO female presents for follow up.  HTN - BP at home and work typically 130s/70s. Tolerating lisinopril well. Feels more energy.  No CP, HA.  Also noted on recent labs to have elevated A1c 6.5%. Working on limiting sugars in her diet.   Labs also showed low Vit D 17. She has not started any supplementation for this.   Wt Readings from Last 3 Encounters:  09/14/15 171 lb (77.565 kg)  08/07/15 172 lb 8 oz (78.245 kg)  01/22/15 168 lb 8 oz (76.431 kg)   BP Readings from Last 3 Encounters:  09/14/15 148/82  08/07/15 162/83  01/22/15 157/80    Past Medical History  Diagnosis Date  . HTN (hypertension)   . Hepatitis C     Dx: 2011 Dr Blocker  . IV drug abuse     HISTORY - greater than 10 years  . Hypertension 07/11/2011  . Hepatitis C, chronic ([place]) 07/11/2011  . Allergic rhinitis 07/11/2011   Family History  Problem Relation Age of Onset  . Hypertension Mother   . Diabetes Father   . Hypertension Brother   . Breast cancer Neg Hx   . Colon cancer Neg Hx    Past Surgical History  Procedure Laterality Date  . Laparoscopic hysterectomy    . Cholecystectomy  2011  . Humerus surgery      due to fracture   Social History   Social History  . Marital Status: Single    Spouse Name: N/A  . Number of Children: 0  . Years of Education: N/A   Occupational History  . 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 Smoking status: Current Every Day Smoker  . Smokeless tobacco: Never Used     Comment: 4/day  . Alcohol Use: No  . Drug Use: No  . Sexual Activity: Not Asked   Other Topics Concern  . None   Social History Narrative   Lives in [place] with mother. Dog. Works at [employer], Primary school teacher.      Regular Exercise -  GYM once a week x 1 hour, stretching daily   Daily Caffeine Use:  3 times a week - coffee             Review  of Systems  Constitutional: Positive for fatigue. Negative for fever, chills, appetite change and unexpected weight change.  Eyes: Negative for visual disturbance.  Respiratory: Negative for shortness of breath.   Cardiovascular: Negative for chest pain, palpitations and leg swelling.  Gastrointestinal: Negative for nausea, vomiting, abdominal pain, diarrhea and constipation.  Skin: Negative for color change and rash.  Hematological: Negative for adenopathy. Does not bruise/bleed easily.  Psychiatric/Behavioral: Positive for sleep disturbance. Negative for dysphoric mood. The patient is not nervous/anxious.        Objective:    BP 148/82 mmHg  Pulse 71  Temp(Src) 98.4 F (36.9 C) (Oral)  Ht 5\' 4"  (1.626 m)  Wt 171 lb (77.565 kg)  BMI 29.34 kg/m2  SpO2 100% Physical Exam  Constitutional: She is oriented to person, place, and time. She appears well-developed and well-nourished. No distress.  HENT:  Head: Normocephalic and atraumatic.  Right Ear: External ear normal.  Left Ear: External ear normal.  Nose: Nose normal.  Mouth/Throat: Oropharynx is clear and moist. No oropharyngeal exudate.  Eyes: Conjunctivae are  normal. Pupils are equal, round, and reactive to light. Right eye exhibits no discharge. Left eye exhibits no discharge. No scleral icterus.  Neck: Normal range of motion. Neck supple. No tracheal deviation present. No thyromegaly present.  Cardiovascular: Normal rate, regular rhythm, normal heart sounds and intact distal pulses.  Exam reveals no gallop and no friction rub.   No murmur heard. Pulmonary/Chest: Effort normal and breath sounds normal. No respiratory distress. She has no wheezes. She has no rales. She exhibits no tenderness.  Musculoskeletal: Normal range of motion. She exhibits no edema or tenderness.  Lymphadenopathy:    She has no cervical adenopathy.  Neurological: She is alert and oriented to person, place, and time. No cranial nerve deficit. She exhibits  normal muscle tone. Coordination normal.  Skin: Skin is warm and dry. No rash noted. She is not diaphoretic. No erythema. No pallor.  Psychiatric: She has a normal mood and affect. Her behavior is normal. Judgment and thought content normal.          Assessment & Plan:   Problem List Items Addressed This Visit      Unprioritized   Elevated blood sugar    BG elevated on recent labs performed by her employer. Encouraged her to limit sugars in her diet. Will recheck A1c in 48months.      Hypertension - Primary    BP Readings from Last 3 Encounters:  09/14/15 148/82  08/07/15 162/83  01/22/15 157/80   BP improved. Will continue Lisinopril 20mg  daily. Follow up labs showed normal Cr and K.      Vitamin D deficiency    Vit D 17 on recent labs. Will start Drisdol 50000units weekly x 12 weeks, then recheck level.      Relevant Medications   ergocalciferol (VITAMIN D2) 50000 units capsule       Return in about 3 months (around 12/15/2015) for Recheck.  Ronette Deter, MD Internal Medicine [facility]

## 2015-09-14 NOTE — Assessment & Plan Note (Signed)
BG elevated on recent labs performed by her employer. Encouraged her to limit sugars in her diet. Will recheck A1c in 23months.

## 2015-09-14 NOTE — Assessment & Plan Note (Signed)
Vit D 17 on recent labs. Will start Drisdol 50000units weekly x 12 weeks, then recheck level.

## 2015-09-14 NOTE — Assessment & Plan Note (Signed)
BP Readings from Last 3 Encounters:  09/14/15 148/82  08/07/15 162/83  01/22/15 157/80   BP improved. Will continue Lisinopril 20mg  daily. Follow up labs showed normal Cr and K.

## 2015-09-14 NOTE — Patient Instructions (Signed)
Start Vit D 50000units weekly.  Limit sugars in your diet.  Repeat labs in 3 months.

## 2015-09-14 NOTE — Progress Notes (Signed)
Pre visit review using our clinic review tool, if applicable. No additional management support is needed unless otherwise documented below in the visit note. 

## 2015-09-20 ENCOUNTER — Encounter: Payer: Self-pay | Admitting: Internal Medicine

## 2015-10-23 ENCOUNTER — Other Ambulatory Visit: Payer: Self-pay | Admitting: Family Medicine

## 2015-10-23 DIAGNOSIS — Z1231 Encounter for screening mammogram for malignant neoplasm of breast: Secondary | ICD-10-CM

## 2015-11-09 ENCOUNTER — Ambulatory Visit
Admission: RE | Admit: 2015-11-09 | Discharge: 2015-11-09 | Disposition: A | Discharge status: Home / Self Care | Payer: BLUE CROSS/BLUE SHIELD | Source: Ambulatory Visit | Attending: Family Medicine | Admitting: Family Medicine

## 2015-11-09 DIAGNOSIS — Z1231 Encounter for screening mammogram for malignant neoplasm of breast: Secondary | ICD-10-CM | POA: Diagnosis not present

## 2015-11-13 ENCOUNTER — Other Ambulatory Visit: Payer: Self-pay | Admitting: Family Medicine

## 2015-11-13 DIAGNOSIS — R928 Other abnormal and inconclusive findings on diagnostic imaging of breast: Secondary | ICD-10-CM

## 2015-11-23 ENCOUNTER — Ambulatory Visit
Admission: RE | Admit: 2015-11-23 | Discharge: 2015-11-23 | Disposition: A | Discharge status: Home / Self Care | Payer: BLUE CROSS/BLUE SHIELD | Source: Ambulatory Visit | Attending: Family Medicine | Admitting: Family Medicine

## 2015-11-23 DIAGNOSIS — R928 Other abnormal and inconclusive findings on diagnostic imaging of breast: Secondary | ICD-10-CM

## 2015-11-23 DIAGNOSIS — R921 Mammographic calcification found on diagnostic imaging of breast: Secondary | ICD-10-CM | POA: Insufficient documentation

## 2015-11-26 ENCOUNTER — Other Ambulatory Visit: Payer: Self-pay | Admitting: Family Medicine

## 2015-11-26 DIAGNOSIS — R920 Mammographic microcalcification found on diagnostic imaging of breast: Secondary | ICD-10-CM

## 2015-12-05 ENCOUNTER — Other Ambulatory Visit: Payer: Self-pay | Admitting: Family Medicine

## 2015-12-05 ENCOUNTER — Ambulatory Visit
Admission: RE | Admit: 2015-12-05 | Discharge: 2015-12-05 | Disposition: A | Discharge status: Home / Self Care | Payer: BLUE CROSS/BLUE SHIELD | Source: Ambulatory Visit | Attending: Family Medicine | Admitting: Family Medicine

## 2015-12-05 DIAGNOSIS — R920 Mammographic microcalcification found on diagnostic imaging of breast: Secondary | ICD-10-CM

## 2015-12-05 DIAGNOSIS — R921 Mammographic calcification found on diagnostic imaging of breast: Secondary | ICD-10-CM | POA: Diagnosis not present

## 2015-12-05 DIAGNOSIS — N6032 Fibrosclerosis of left breast: Secondary | ICD-10-CM | POA: Diagnosis not present

## 2015-12-05 HISTORY — PX: BREAST BIOPSY: SHX20

## 2015-12-07 LAB — SURGICAL PATHOLOGY

## 2015-12-19 ENCOUNTER — Ambulatory Visit (INDEPENDENT_AMBULATORY_CARE_PROVIDER_SITE_OTHER): Payer: BLUE CROSS/BLUE SHIELD | Admitting: Internal Medicine

## 2015-12-19 ENCOUNTER — Encounter: Payer: Self-pay | Admitting: Internal Medicine

## 2015-12-19 VITALS — BP 162/88 | HR 65 | Ht 64.0 in | Wt 174.0 lb

## 2015-12-19 DIAGNOSIS — E559 Vitamin D deficiency, unspecified: Secondary | ICD-10-CM | POA: Diagnosis not present

## 2015-12-19 DIAGNOSIS — I10 Essential (primary) hypertension: Secondary | ICD-10-CM

## 2015-12-19 DIAGNOSIS — Z1211 Encounter for screening for malignant neoplasm of colon: Secondary | ICD-10-CM | POA: Diagnosis not present

## 2015-12-19 DIAGNOSIS — R7309 Other abnormal glucose: Secondary | ICD-10-CM | POA: Diagnosis not present

## 2015-12-19 DIAGNOSIS — R739 Hyperglycemia, unspecified: Secondary | ICD-10-CM

## 2015-12-19 MED ORDER — LISINOPRIL 20 MG PO TABS: 20.0000 mg | ORAL_TABLET | Freq: Every day | ORAL | Status: DC

## 2015-12-19 NOTE — Progress Notes (Signed)
Pre visit review using our clinic review tool, if applicable. No additional management support is needed unless otherwise documented below in the visit note. 

## 2015-12-19 NOTE — Assessment & Plan Note (Signed)
Recheck Vit D with labs today. 

## 2015-12-19 NOTE — Patient Instructions (Signed)
Labs today. Follow up in 1 week.

## 2015-12-19 NOTE — Progress Notes (Signed)
Subjective:    Patient ID: Melissa Garrison, female    DOB: 23-Mar-1961, 55 y.o.   MRN: [id number]  HPI  54YO female presents for follow up.  HTN - BP generally well controlled at home. Compliant with medication. Notes that she smoked right before this appointment and did not sleep last night.  Wt Readings from Last 3 Encounters:  12/19/15 174 lb (78.926 kg)  09/14/15 171 lb (77.565 kg)  08/07/15 172 lb 8 oz (78.245 kg)   BP Readings from Last 3 Encounters:  12/19/15 162/88  09/14/15 148/82  08/07/15 162/83    Past Medical History  Diagnosis Date  . HTN (hypertension)   . Hepatitis C     Dx: 2011 Dr Blocker  . IV drug abuse     HISTORY - greater than 10 years  . Hypertension 07/11/2011  . Hepatitis C, chronic ([place]) 07/11/2011  . Allergic rhinitis 07/11/2011   Family History  Problem Relation Age of Onset  . Hypertension Mother   . Diabetes Father   . Hypertension Brother   . Breast cancer Neg Hx   . Colon cancer Neg Hx    Past Surgical History  Procedure Laterality Date  . Laparoscopic hysterectomy    . Cholecystectomy  2011  . Humerus surgery      due to fracture  . Breast biopsy Left 12/05/2015    stereotactic biopsy- awaiting results   Social History   Social History  . Marital Status: Single    Spouse Name: N/A  . Number of Children: 0  . Years of Education: N/A   Occupational History  . [employer] operator    Social History Main Topics  . Smoking status: Current Every Day Smoker  . Smokeless tobacco: Never Used     Comment: 4/day  . Alcohol Use: No  . Drug Use: No  . Sexual Activity: Not Asked   Other Topics Concern  . None   Social History Narrative   Lives in [place] with mother. Dog. Works at [employer], Primary school teacher.      Regular Exercise -  GYM once a week x 1 hour, stretching daily   Daily Caffeine Use:  3 times a week - coffee             Review of Systems  Constitutional: Negative for fever, chills, appetite  change, fatigue and unexpected weight change.  Eyes: Negative for visual disturbance.  Respiratory: Negative for shortness of breath.   Cardiovascular: Negative for chest pain, palpitations and leg swelling.  Gastrointestinal: Negative for nausea, vomiting, abdominal pain, diarrhea and constipation.  Skin: Negative for color change and rash.  Neurological: Negative for headaches.  Hematological: Negative for adenopathy. Does not bruise/bleed easily.  Psychiatric/Behavioral: Negative for dysphoric mood. The patient is not nervous/anxious.        Objective:    BP 162/88 mmHg  Pulse 65  Ht 5\' 4"  (1.626 m)  Wt 174 lb (78.926 kg)  BMI 29.85 kg/m2  SpO2 99% Physical Exam  Constitutional: She is oriented to person, place, and time. She appears well-developed and well-nourished. No distress.  HENT:  Head: Normocephalic and atraumatic.  Right Ear: External ear normal.  Left Ear: External ear normal.  Nose: Nose normal.  Mouth/Throat: Oropharynx is clear and moist. No oropharyngeal exudate.  Eyes: Conjunctivae are normal. Pupils are equal, round, and reactive to light. Right eye exhibits no discharge. Left eye exhibits no discharge. No scleral icterus.  Neck: Normal range of  motion. Neck supple. No tracheal deviation present. No thyromegaly present.  Cardiovascular: Normal rate, regular rhythm, normal heart sounds and intact distal pulses.  Exam reveals no gallop and no friction rub.   No murmur heard. Pulmonary/Chest: Effort normal and breath sounds normal. No respiratory distress. She has no wheezes. She has no rales. She exhibits no tenderness.  Musculoskeletal: Normal range of motion. She exhibits no edema or tenderness.  Lymphadenopathy:    She has no cervical adenopathy.  Neurological: She is alert and oriented to person, place, and time. No cranial nerve deficit. She exhibits normal muscle tone. Coordination normal.  Skin: Skin is warm and dry. No rash noted. She is not diaphoretic.  No erythema. No pallor.  Psychiatric: She has a normal mood and affect. Her behavior is normal. Judgment and thought content normal.          Assessment & Plan:   Problem List Items Addressed This Visit    None       No Follow-up on file.  Ronette Deter, MD Internal Medicine [facility]

## 2015-12-19 NOTE — Assessment & Plan Note (Signed)
BP Readings from Last 3 Encounters:  12/19/15 162/88  09/14/15 148/82  08/07/15 162/83   BP elevated today, however has been better controlled generally. Will recheck in 1 week. Discussed possibly increasing Lisinopril to 40mg  if BP continues to be elevated.

## 2015-12-19 NOTE — Assessment & Plan Note (Signed)
Recheck A1c with labs today.

## 2015-12-21 ENCOUNTER — Encounter: Payer: Self-pay | Admitting: Internal Medicine

## 2016-02-15 DIAGNOSIS — Z8371 Family history of colonic polyps: Secondary | ICD-10-CM | POA: Insufficient documentation

## 2016-04-01 ENCOUNTER — Encounter: Payer: Self-pay | Admitting: Internal Medicine

## 2016-04-01 DIAGNOSIS — D125 Benign neoplasm of sigmoid colon: Secondary | ICD-10-CM | POA: Diagnosis not present

## 2016-04-01 DIAGNOSIS — K621 Rectal polyp: Secondary | ICD-10-CM | POA: Diagnosis not present

## 2016-04-01 DIAGNOSIS — K635 Polyp of colon: Secondary | ICD-10-CM | POA: Diagnosis not present

## 2016-04-01 DIAGNOSIS — D128 Benign neoplasm of rectum: Secondary | ICD-10-CM | POA: Diagnosis not present

## 2016-04-01 DIAGNOSIS — Z1211 Encounter for screening for malignant neoplasm of colon: Secondary | ICD-10-CM | POA: Diagnosis not present

## 2016-04-01 DIAGNOSIS — D129 Benign neoplasm of anus and anal canal: Secondary | ICD-10-CM | POA: Diagnosis not present

## 2016-04-01 DIAGNOSIS — D126 Benign neoplasm of colon, unspecified: Secondary | ICD-10-CM | POA: Diagnosis not present

## 2016-04-01 DIAGNOSIS — Z8371 Family history of colonic polyps: Secondary | ICD-10-CM | POA: Diagnosis not present

## 2016-04-01 DIAGNOSIS — D122 Benign neoplasm of ascending colon: Secondary | ICD-10-CM | POA: Diagnosis not present

## 2016-05-29 ENCOUNTER — Ambulatory Visit (INDEPENDENT_AMBULATORY_CARE_PROVIDER_SITE_OTHER): Payer: BLUE CROSS/BLUE SHIELD | Admitting: Family

## 2016-05-29 ENCOUNTER — Encounter: Payer: Self-pay | Admitting: Family

## 2016-05-29 VITALS — BP 155/80 | HR 73 | Temp 97.8°F | Ht 64.0 in | Wt 174.2 lb

## 2016-05-29 DIAGNOSIS — B182 Chronic viral hepatitis C: Secondary | ICD-10-CM

## 2016-05-29 DIAGNOSIS — R739 Hyperglycemia, unspecified: Secondary | ICD-10-CM | POA: Diagnosis not present

## 2016-05-29 DIAGNOSIS — I10 Essential (primary) hypertension: Secondary | ICD-10-CM | POA: Diagnosis not present

## 2016-05-29 DIAGNOSIS — Z1239 Encounter for other screening for malignant neoplasm of breast: Secondary | ICD-10-CM

## 2016-05-29 DIAGNOSIS — Z1231 Encounter for screening mammogram for malignant neoplasm of breast: Secondary | ICD-10-CM | POA: Diagnosis not present

## 2016-05-29 MED ORDER — AMLODIPINE BESYLATE 2.5 MG PO TABS: 2.5000 mg | ORAL_TABLET | Freq: Every day | ORAL | 3 refills | Status: DC

## 2016-05-29 NOTE — Assessment & Plan Note (Signed)
Pending a1c. 

## 2016-05-29 NOTE — Assessment & Plan Note (Signed)
Elevated. Start 2.5 mg amlodipine. F/u 3 months. Will check BP at work and call if higher than 140/90.

## 2016-05-29 NOTE — Patient Instructions (Addendum)
BP goal less than 140/90.  Check BP at work and call me if persistently higher than 140/90.  Due for a1c, CMP, and Hepatic function panel. Please have done with [facility]

## 2016-05-29 NOTE — Progress Notes (Signed)
Subjective:    Patient ID: Melissa Garrison, female    DOB: Nov 20, 1960, 55 y.o.   MRN: [id number]  CC: Melissa Garrison is a 55 y.o. female who presents today for follow up.   HPI: Here for follow-up  Left breast biopsy negative for malignancy 11/2015. Repeat mammogram 05/2016.   HTN- Compliant with medications. Denies exertional chest pain or pressure, numbness or tingling radiating to left arm or jaw, palpitations, dizziness, frequent headaches, changes in vision, or shortness of breath.           HISTORY:  Past Medical History:  Diagnosis Date  . Allergic rhinitis 07/11/2011  . Hepatitis C    Dx: 2011 Dr Blocker  . Hepatitis C, chronic ([place]) 07/11/2011  . HTN (hypertension)   . Hypertension 07/11/2011  . IV drug abuse    HISTORY - greater than 10 years   Past Surgical History:  Procedure Laterality Date  . BREAST BIOPSY Left 12/05/2015   stereotactic biopsy- awaiting results  . CHOLECYSTECTOMY  2011  . HUMERUS SURGERY     due to fracture  . LAPAROSCOPIC HYSTERECTOMY     Family History  Problem Relation Age of Onset  . Hypertension Mother   . Diabetes Father   . Hypertension Brother   . Breast cancer Neg Hx   . Colon cancer Neg Hx     Allergies: Patient has no known allergies. Current Outpatient Prescriptions on File Prior to Visit  Medication Sig Dispense Refill  . ergocalciferol (VITAMIN D2) 50000 units capsule Take 1 capsule (50,000 Units total) by mouth once a week. 12 capsule 0  . lisinopril (PRINIVIL,ZESTRIL) 20 MG tablet Take 1 tablet (20 mg total) by mouth daily. 90 tablet 3   No current facility-administered medications on file prior to visit.     Social History  Substance Use Topics  . Smoking status: Current Every Day Smoker  . Smokeless tobacco: Never Used     Comment: 4/day  . Alcohol use No    Review of Systems  Constitutional: Negative for chills and fever.  Eyes: Negative for visual disturbance.  Respiratory: Negative for cough.     Cardiovascular: Negative for chest pain and palpitations.  Gastrointestinal: Negative for nausea and vomiting.      Objective:    BP (!) 155/80   Pulse 73   Temp 97.8 F (36.6 C) (Oral)   Ht 5\' 4"  (1.626 m)   Wt 174 lb 3.2 oz (79 kg)   SpO2 98%   BMI 29.90 kg/m  BP Readings from Last 3 Encounters:  05/29/16 (!) 155/80  12/19/15 (!) 162/88  09/14/15 (!) 148/82   Wt Readings from Last 3 Encounters:  05/29/16 174 lb 3.2 oz (79 kg)  12/19/15 174 lb (78.9 kg)  09/14/15 171 lb (77.6 kg)    Physical Exam  Constitutional: She appears well-developed and well-nourished.  Eyes: Conjunctivae are normal.  Cardiovascular: Normal rate, regular rhythm, normal heart sounds and normal pulses.   Pulmonary/Chest: Effort normal and breath sounds normal. She has no wheezes. She has no rhonchi. She has no rales.  Neurological: She is alert.  Skin: Skin is warm and dry.  Psychiatric: She has a normal mood and affect. Her speech is normal and behavior is normal. Thought content normal.  Vitals reviewed.      Assessment & Plan:   Problem List Items Addressed This Visit      Cardiovascular and Mediastinum   Hypertension - Primary    Elevated. Start 2.5 mg amlodipine.  F/u 3 months. Will check BP at work and call if higher than 140/90.      Relevant Medications   amLODipine (NORVASC) 2.5 MG tablet   Other Relevant Orders   Comprehensive metabolic panel   Hemoglobin A1c     Digestive   Hepatitis C, chronic (HCC)    Stable. Pending hepatic panel done at [facility] ( per request).      Relevant Orders   Hepatic function panel     Other   Elevated blood sugar    Pending a1c.        Other Visit Diagnoses    Screening for breast cancer       Relevant Orders   MM DIGITAL SCREENING BILATERAL       I am having Ms. Melissa Garrison start on amLODipine. I am also having her maintain her ergocalciferol and lisinopril.   Meds ordered this encounter  Medications  . amLODipine  (NORVASC) 2.5 MG tablet    Sig: Take 1 tablet (2.5 mg total) by mouth daily.    Dispense:  90 tablet    Refill:  3    Order Specific Question:   Supervising Provider    Answer:   Melissa Garrison [[id number]]    Return precautions given.   Risks, benefits, and alternatives of the medications and treatment plan prescribed today were discussed, and patient expressed understanding.   Education regarding symptom management and diagnosis given to patient on AVS.  Continue to follow with Melissa Mast, MD for routine health maintenance.   Melissa Garrison and I agreed with plan.   Mable Paris, FNP

## 2016-05-29 NOTE — Progress Notes (Signed)
Pre visit review using our clinic review tool, if applicable. No additional management support is needed unless otherwise documented below in the visit note. 

## 2016-05-29 NOTE — Assessment & Plan Note (Signed)
Stable. Pending hepatic panel done at her [facility] ( per request).

## 2016-05-29 NOTE — Addendum Note (Signed)
Addended by: Elpidio Galea T on: 05/29/2016 10:01 AM   Modules accepted: Orders

## 2016-05-30 NOTE — Progress Notes (Signed)
Left message for patient to return call back.  

## 2016-05-30 NOTE — Progress Notes (Signed)
Patient has been informed.

## 2016-06-06 ENCOUNTER — Telehealth: Payer: Self-pay | Admitting: *Deleted

## 2016-06-06 DIAGNOSIS — Z1239 Encounter for other screening for malignant neoplasm of breast: Secondary | ICD-10-CM

## 2016-06-06 NOTE — Telephone Encounter (Signed)
Please advise and reorder if needed, thanks

## 2016-06-06 NOTE — Telephone Encounter (Signed)
Patient stated that [facility] told her that the wrong mammogram was ordered  Pt request the correct order to be placed Please call pt once corrected order has been placed Pt contact [phone]

## 2016-06-08 NOTE — Telephone Encounter (Signed)
Hi melissa!  I reordered the diagnositic mammo and [place] for this patient.  Is this correct now for Melissa Garrison? See note below.   If yes, let brock know so he can patient.

## 2016-06-11 ENCOUNTER — Other Ambulatory Visit: Payer: Self-pay | Admitting: Family

## 2016-06-11 DIAGNOSIS — N6320 Unspecified lump in the left breast, unspecified quadrant: Secondary | ICD-10-CM

## 2016-06-11 NOTE — Telephone Encounter (Signed)
I'm sorry! No its not. Your ordered a b/l diagnostic and she only needs the unilateral.

## 2016-06-11 NOTE — Progress Notes (Signed)
Orders placed.

## 2016-06-12 NOTE — Telephone Encounter (Signed)
Hopefully done right now :)

## 2016-06-25 ENCOUNTER — Other Ambulatory Visit: Payer: Self-pay | Admitting: Family

## 2016-06-30 ENCOUNTER — Encounter: Payer: Self-pay | Admitting: Family

## 2016-07-17 ENCOUNTER — Other Ambulatory Visit: Payer: BLUE CROSS/BLUE SHIELD

## 2016-08-08 ENCOUNTER — Ambulatory Visit
Admission: RE | Admit: 2016-08-08 | Discharge: 2016-08-08 | Disposition: A | Discharge status: Home / Self Care | Payer: BLUE CROSS/BLUE SHIELD | Source: Ambulatory Visit | Attending: Family | Admitting: Family

## 2016-08-08 ENCOUNTER — Other Ambulatory Visit: Payer: Self-pay | Admitting: Family

## 2016-08-08 DIAGNOSIS — N6320 Unspecified lump in the left breast, unspecified quadrant: Secondary | ICD-10-CM | POA: Diagnosis present

## 2016-08-08 DIAGNOSIS — R921 Mammographic calcification found on diagnostic imaging of breast: Secondary | ICD-10-CM | POA: Diagnosis not present

## 2016-08-11 ENCOUNTER — Other Ambulatory Visit: Payer: Self-pay | Admitting: Family

## 2016-08-11 DIAGNOSIS — Z1239 Encounter for other screening for malignant neoplasm of breast: Secondary | ICD-10-CM

## 2016-08-11 NOTE — Progress Notes (Signed)
Order placed

## 2016-08-18 ENCOUNTER — Encounter: Payer: Self-pay | Admitting: Family

## 2016-08-27 ENCOUNTER — Ambulatory Visit: Payer: BLUE CROSS/BLUE SHIELD | Admitting: Family

## 2016-09-01 ENCOUNTER — Telehealth: Payer: Self-pay | Admitting: Family

## 2016-09-01 MED ORDER — LISINOPRIL 20 MG PO TABS: 20.0000 mg | ORAL_TABLET | Freq: Every day | ORAL | 3 refills | Status: DC

## 2016-09-01 NOTE — Telephone Encounter (Signed)
Medication has been refilled.

## 2016-09-01 NOTE — Telephone Encounter (Signed)
Pt has an appt 09/08/16, she is requesting to have her lisinopril (PRINIVIL,ZESTRIL) 20 MG tablet refilled. Please advise.  Thanks.

## 2016-09-08 ENCOUNTER — Encounter: Payer: Self-pay | Admitting: Family

## 2016-09-08 ENCOUNTER — Ambulatory Visit (INDEPENDENT_AMBULATORY_CARE_PROVIDER_SITE_OTHER): Payer: BLUE CROSS/BLUE SHIELD | Admitting: Family

## 2016-09-08 DIAGNOSIS — I10 Essential (primary) hypertension: Secondary | ICD-10-CM

## 2016-09-08 MED ORDER — AMLODIPINE BESYLATE 5 MG PO TABS: 2.5000 mg | ORAL_TABLET | Freq: Every day | ORAL | 1 refills | Status: DC

## 2016-09-08 NOTE — Progress Notes (Signed)
Subjective:    Patient ID: Melissa Garrison, female    DOB: 07-31-1960, 56 y.o.   MRN: [id number]  CC: Melissa Garrison is a 56 y.o. female who presents today for follow up.   HPI: Averages 147/70. Suspects elevated this morning as upset [facility] lost night. Compliant with medications. Denies exertional chest pain or pressure, numbness or tingling radiating to left arm or jaw, palpitations, dizziness, frequent headaches, changes in vision, or shortness of breath.    Declines PNA vaccine today.       HISTORY:  Past Medical History:  Diagnosis Date  . Allergic rhinitis 07/11/2011  . Hepatitis C    Dx: 2011 Dr Blocker  . Hepatitis C, chronic ([place]) 07/11/2011  . HTN (hypertension)   . Hypertension 07/11/2011  . IV drug abuse    HISTORY - greater than 10 years   Past Surgical History:  Procedure Laterality Date  . BREAST BIOPSY Left 12/05/2015   stereotactic biopsy- benign  . CHOLECYSTECTOMY  2011  . HUMERUS SURGERY     due to fracture  . LAPAROSCOPIC HYSTERECTOMY     Family History  Problem Relation Age of Onset  . Hypertension Mother   . Diabetes Father   . Hypertension Brother   . Breast cancer Neg Hx   . Colon cancer Neg Hx     Allergies: Patient has no known allergies. Current Outpatient Prescriptions on File Prior to Visit  Medication Sig Dispense Refill  . ergocalciferol (VITAMIN D2) 50000 units capsule Take 1 capsule (50,000 Units total) by mouth once a week. 12 capsule 0  . lisinopril (PRINIVIL,ZESTRIL) 20 MG tablet Take 1 tablet (20 mg total) by mouth daily. 90 tablet 3   No current facility-administered medications on file prior to visit.     Social History  Substance Use Topics  . Smoking status: Current Every Day Smoker  . Smokeless tobacco: Never Used     Comment: 4/day  . Alcohol use No    Review of Systems  Constitutional: Negative for chills and fever.  Respiratory: Negative for cough.   Cardiovascular: Negative for chest pain and palpitations.    Gastrointestinal: Negative for nausea and vomiting.  Neurological: Negative for headaches.      Objective:    BP (!) 145/55   Pulse 75   Temp 98.7 F (37.1 C) (Oral)   Ht 5\' 4"  (1.626 m)   Wt 169 lb 9.6 oz (76.9 kg)   SpO2 99%   BMI 29.11 kg/m  BP Readings from Last 3 Encounters:  09/08/16 (!) 145/55  05/29/16 (!) 155/80  12/19/15 (!) 162/88   Wt Readings from Last 3 Encounters:  09/08/16 169 lb 9.6 oz (76.9 kg)  05/29/16 174 lb 3.2 oz (79 kg)  12/19/15 174 lb (78.9 kg)    Physical Exam  Constitutional: She appears well-developed and well-nourished.  Eyes: Conjunctivae are normal.  Cardiovascular: Normal rate, regular rhythm, normal heart sounds and normal pulses.   Pulmonary/Chest: Effort normal and breath sounds normal. She has no wheezes. She has no rhonchi. She has no rales.  Neurological: She is alert.  Skin: Skin is warm and dry.  Psychiatric: She has a normal mood and affect. Her speech is normal and behavior is normal. Thought content normal.  Vitals reviewed.      Assessment & Plan:   Problem List Items Addressed This Visit      Cardiovascular and Mediastinum   Hypertension    Elevated. DBP on low end, will watch. Fluctuate BP, will  trial increase amlodipine to 5mg . She will bring labs from work done this month      Relevant Medications   amLODipine (NORVASC) 5 MG tablet       I have changed Ms. Urizar's amLODipine. I am also having her maintain her ergocalciferol and lisinopril.   Meds ordered this encounter  Medications  . amLODipine (NORVASC) 5 MG tablet    Sig: Take 0.5 tablets (2.5 mg total) by mouth daily.    Dispense:  90 tablet    Refill:  1    Order Specific Question:   Supervising Provider    Answer:   Crecencio Mc [[id number]]    Return precautions given.   Risks, benefits, and alternatives of the medications and treatment plan prescribed today were discussed, and patient expressed understanding.   Education regarding symptom  management and diagnosis given to patient on AVS.  Continue to follow with Mable Paris, FNP for routine health maintenance.   [name] and I agreed with plan.   Mable Paris, FNP

## 2016-09-08 NOTE — Patient Instructions (Addendum)
Remind tammy to resend labs.   Continue to monitor blood pressure with Tammy. However I want you to be sure the top number is following consistently less than 140. The bottom number, please make sure does not go any lower than 55 as it is today.  Follow-up in 6 months, sooner if blood pressure is not well controlled.

## 2016-09-08 NOTE — Progress Notes (Signed)
Pre visit review using our clinic review tool, if applicable. No additional management support is needed unless otherwise documented below in the visit note. 

## 2016-09-08 NOTE — Assessment & Plan Note (Signed)
Elevated. DBP on low end, will watch. Fluctuate BP, will trial increase amlodipine to 5mg . She will bring labs from work done this month

## 2016-11-04 ENCOUNTER — Ambulatory Visit: Payer: BLUE CROSS/BLUE SHIELD

## 2016-11-10 ENCOUNTER — Ambulatory Visit
Admission: RE | Admit: 2016-11-10 | Discharge: 2016-11-10 | Disposition: A | Discharge status: Home / Self Care | Payer: BLUE CROSS/BLUE SHIELD | Source: Ambulatory Visit | Attending: Family | Admitting: Family

## 2016-11-10 DIAGNOSIS — Z1231 Encounter for screening mammogram for malignant neoplasm of breast: Secondary | ICD-10-CM | POA: Insufficient documentation

## 2016-11-10 DIAGNOSIS — Z1239 Encounter for other screening for malignant neoplasm of breast: Secondary | ICD-10-CM

## 2016-11-11 ENCOUNTER — Encounter: Payer: Self-pay | Admitting: *Deleted

## 2017-02-10 ENCOUNTER — Other Ambulatory Visit: Payer: Self-pay | Admitting: Family

## 2017-02-10 MED ORDER — LISINOPRIL 20 MG PO TABS: 20.0000 mg | ORAL_TABLET | Freq: Every day | ORAL | 3 refills | Status: DC

## 2017-02-10 NOTE — Telephone Encounter (Signed)
Sent in Rx

## 2017-02-10 NOTE — Telephone Encounter (Signed)
Pt called requesting a refill on her lisinopril (PRINIVIL,ZESTRIL) 20 MG tablet. Please advise, thank you!  Melissa [address] Melissa Garrison

## 2017-03-02 ENCOUNTER — Encounter: Payer: Self-pay | Admitting: *Deleted

## 2017-03-03 ENCOUNTER — Ambulatory Visit: Payer: BLUE CROSS/BLUE SHIELD | Admitting: Family

## 2017-03-03 ENCOUNTER — Encounter: Payer: Self-pay | Admitting: General Surgery

## 2017-03-03 ENCOUNTER — Ambulatory Visit (INDEPENDENT_AMBULATORY_CARE_PROVIDER_SITE_OTHER): Payer: BLUE CROSS/BLUE SHIELD | Admitting: General Surgery

## 2017-03-03 VITALS — BP 120/74 | HR 80 | Resp 14 | Ht 61.0 in | Wt 171.0 lb

## 2017-03-03 DIAGNOSIS — L732 Hidradenitis suppurativa: Secondary | ICD-10-CM | POA: Diagnosis not present

## 2017-03-03 NOTE — Progress Notes (Signed)
Patient ID: Melissa Garrison, female   DOB: Nov 16, 1960, 56 y.o.   MRN: [id number]  Chief Complaint  Patient presents with  . Abscess    HPI Melissa Garrison is a 56 y.o. female.  Here for evaluation of a right axillary boil referred by Sallyanne Havers NP.  She states she first noticed it Thursday. It did drain yesterday dark yellow and she states it feels better. She states it has flared up about 3-4 times this year. She states the [place]ocin yesterday. She states the same area has been a problem and was lanced by Dr Emilio Math in the past. She is here with her niece, Melissa Garrison.  HPI  Past Medical History:  Diagnosis Date  . Allergic rhinitis 07/11/2011  . Hepatitis C    Dx: 2011 Dr Blocker  . Hepatitis C, chronic ([place]) 07/11/2011  . HTN (hypertension)   . Hypertension 07/11/2011  . IV drug abuse    HISTORY - greater than 10 years    Past Surgical History:  Procedure Laterality Date  . BREAST BIOPSY Left 12/05/2015   stereotactic biopsy- benign  . CHOLECYSTECTOMY  2011  . HUMERUS SURGERY     due to fracture  . LAPAROSCOPIC HYSTERECTOMY      Family History  Problem Relation Age of Onset  . Hypertension Mother   . Diabetes Father   . Hypertension Brother   . Breast cancer Neg Hx   . Colon cancer Neg Hx     Social History Social History  Substance Use Topics  . Smoking status: Current Every Day Smoker    Packs/day: 0.50    Years: 10.00    Types: Cigarettes  . Smokeless tobacco: Never Used     Comment: 4/day  . Alcohol use Yes    No Known Allergies  Current Outpatient Prescriptions  Medication Sig Dispense Refill  . clindamycin (CLEOCIN) 300 MG capsule Take 300 mg by mouth 3 (three) times daily.    . ergocalciferol (VITAMIN D2) 50000 units capsule Take 1 capsule (50,000 Units total) by mouth once a week. 12 capsule 0  . lisinopril (PRINIVIL,ZESTRIL) 20 MG tablet Take 1 tablet (20 mg total) by mouth daily. 90 tablet 3   No current facility-administered medications for  this visit.     Review of Systems Review of Systems  Constitutional: Negative.   Respiratory: Negative.   Cardiovascular: Negative.     Blood pressure 120/74, pulse 80, resp. rate 14, height 5\' 1"  (1.549 m), weight 171 lb (77.6 kg).  Physical Exam Physical Exam  Constitutional: She is oriented to person, place, and time. She appears well-developed and well-nourished.  Cardiovascular: Normal rate and normal heart sounds.   Pulmonary/Chest: Effort normal and breath sounds normal.    Lymphadenopathy:    She has no cervical adenopathy.    She has no axillary adenopathy.  3 by 6 area of thickening skin under right axillary, little drainage.  Neurological: She is alert and oriented to person, place, and time.  Skin: Skin is warm and dry.    Data Reviewed Laboratory studies dated 08/17/2015 showed normal liver function studies. Electrolytes were normal. Creatinine 0.7. Hemoglobin 12.6 with a white blood cell count 7700 MCV of 86. Platelet count of 292,000.  Assessment    Recurrent inflammatory changes in the right axilla secondary to hidradenitis.    Plan    There is a fairly focal area in the patient would be likely best served by excision. The possibility of referral to dermatology for consideration of immunosuppression  was discussed. With her past history of hepatitis C successfully treated with interferon, there might be some hesitancy to introduce immunosuppression. The patient did not 1 to pursue this any further.  Indications for surgery anticipate her recovery reviewed.   Patient to be scheduled for excision right axillary . The patient is aware to call back for any questions or concerns.  HPI, Physical Exam, Assessment and Plan have been scribed under the direction and in the presence of Hervey Ard, MD.  Gaspar Cola, CMA  I have completed the exam and reviewed the above documentation for accuracy and completeness.  I agree with the above.  Haematologist  has been used and any errors in dictation or transcription are unintentional.  Hervey Ard, M.D., F.A.C.S.  The patient is scheduled for surgery at [facility] on 03/20/17. She will pre admit by phone. The patient is aware of date and time.  Documented by Lesly Rubenstein LPN  Robert Bellow 03/03/2017, 5:25 PM

## 2017-03-03 NOTE — Patient Instructions (Addendum)
Patient to be scheduled for excision right axillary . The patient is aware to call back for any questions or concerns.  The patient is scheduled for surgery at [facility] on 03/20/17. She will pre admit by phone. The patient is aware of date and time.

## 2017-03-13 ENCOUNTER — Encounter
Admission: RE | Admit: 2017-03-13 | Discharge: 2017-03-13 | Disposition: A | Discharge status: Home / Self Care | Payer: BLUE CROSS/BLUE SHIELD | Source: Ambulatory Visit | Attending: General Surgery | Admitting: General Surgery

## 2017-03-13 DIAGNOSIS — R9431 Abnormal electrocardiogram [ECG] [EKG]: Secondary | ICD-10-CM | POA: Insufficient documentation

## 2017-03-13 DIAGNOSIS — Z01812 Encounter for preprocedural laboratory examination: Secondary | ICD-10-CM | POA: Insufficient documentation

## 2017-03-13 DIAGNOSIS — I10 Essential (primary) hypertension: Secondary | ICD-10-CM | POA: Insufficient documentation

## 2017-03-13 DIAGNOSIS — Z01810 Encounter for preprocedural cardiovascular examination: Secondary | ICD-10-CM | POA: Insufficient documentation

## 2017-03-13 NOTE — Patient Instructions (Signed)
  Your procedure is scheduled on: 03-20-17 FRIDAY Report to Same Day Surgery 2nd floor medical mall [facility] Entrance-take elevator on left to 2nd floor.  Check in with surgery information desk.) To find out your arrival time please call [phone] between 1PM - 3PM on 03-19-17 THURSDAY  Remember: Instructions that are not followed completely may result in serious medical risk, up to and including death, or upon the discretion of your surgeon and anesthesiologist your surgery may need to be rescheduled.    _x___ 1. Do not eat food after midnight the night before your procedure. NO GUM CHEWING OR HARD CANDIES.  You may drink clear liquids up to 2 hours before you are scheduled to arrive at the hospital for your procedure.  Do not drink clear liquids within 2 hours of your scheduled arrival to the hospital.  Clear liquids include  --Water or Apple juice without pulp  --Clear carbohydrate beverage such as ClearFast or Gatorade  --Black Coffee or Clear Tea (No milk, no creamers, do not add anything to the coffee or Tea)  Type 1 and type 2 diabetics should only drink water.     __x__ 2. No Alcohol for 24 hours before or after surgery.   __x__3. No Smoking for 24 prior to surgery.   ____  4. Bring all medications with you on the day of surgery if instructed.    __x__ 5. Notify your doctor if there is any change in your medical condition     (cold, fever, infections).     Do not wear jewelry, make-up, hairpins, clips or nail polish.  Do not wear lotions, powders, or perfumes. You may wear deodorant.  Do not shave 48 hours prior to surgery. Men may shave face and neck.  Do not bring valuables to the hospital.    [facility] is not responsible for any belongings or valuables.               Contacts, dentures or bridgework may not be worn into surgery.  Leave your suitcase in the car. After surgery it may be brought to your room.  For patients admitted to the hospital, discharge time is  determined by your treatment team.   Patients discharged the day of surgery will not be allowed to drive home.  You will need someone to drive you home and stay with you the night of your procedure.    Please read over the following fact sheets that you were given:    ____ Take anti-hypertensive listed below, cardiac, seizure, asthma, anti-reflux and psychiatric medicines. These include:  1. NONE  2.  3.  4.  5.  6.  ____Fleets enema or Magnesium Citrate as directed.   _x___ Use CHG Soap or sage wipes as directed on instruction sheet   ____ Use inhalers on the day of surgery and bring to hospital day of surgery  ____ Stop Metformin and Janumet 2 days prior to surgery.    ____ Take 1/2 of usual insulin dose the night before surgery and none on the morning surgery.   ____ Follow recommendations from Cardiologist, Pulmonologist or PCP regarding stopping Aspirin, Coumadin, Plavix ,Eliquis, Effient, or Pradaxa, and Pletal.  ____Stop Anti-inflammatories such as Advil, Aleve, Ibuprofen, Motrin, Naproxen, Naprosyn, Goodies powders or aspirin products. OK to take Tylenol .   ____ Stop supplements until after surgery.     ____ Bring C-Pap to the hospital.

## 2017-03-16 ENCOUNTER — Encounter
Admission: RE | Admit: 2017-03-16 | Discharge: 2017-03-16 | Disposition: A | Discharge status: Home / Self Care | Payer: BLUE CROSS/BLUE SHIELD | Source: Ambulatory Visit | Attending: General Surgery | Admitting: General Surgery

## 2017-03-16 DIAGNOSIS — R9431 Abnormal electrocardiogram [ECG] [EKG]: Secondary | ICD-10-CM | POA: Diagnosis not present

## 2017-03-16 DIAGNOSIS — Z01812 Encounter for preprocedural laboratory examination: Secondary | ICD-10-CM | POA: Diagnosis not present

## 2017-03-16 DIAGNOSIS — Z01810 Encounter for preprocedural cardiovascular examination: Secondary | ICD-10-CM | POA: Diagnosis not present

## 2017-03-16 DIAGNOSIS — I10 Essential (primary) hypertension: Secondary | ICD-10-CM | POA: Diagnosis not present

## 2017-03-16 LAB — COMPREHENSIVE METABOLIC PANEL
ALK PHOS: 52 U/L (ref 38–126)
ALT: 13 U/L — ABNORMAL LOW (ref 14–54)
ANION GAP: 8 (ref 5–15)
AST: 20 U/L (ref 15–41)
Albumin: 3.9 g/dL (ref 3.5–5.0)
BILIRUBIN TOTAL: 0.3 mg/dL (ref 0.3–1.2)
BUN: 11 mg/dL (ref 6–20)
CHLORIDE: 104 mmol/L (ref 101–111)
CO2: 27 mmol/L (ref 22–32)
CREATININE: 0.57 mg/dL (ref 0.44–1.00)
Calcium: 9.5 mg/dL (ref 8.9–10.3)
GFR calc non Af Amer: >60 mL/min (ref >60)
Glucose, Bld: 153 mg/dL — ABNORMAL HIGH (ref 65–99)
POTASSIUM: 3.5 mmol/L (ref 3.5–5.1)
Sodium: 139 mmol/L (ref 135–145)
Total Protein: 7.9 g/dL (ref 6.5–8.1)

## 2017-03-16 NOTE — Pre-Procedure Instructions (Signed)
DR Randa Lynn REVIEWED ABNORMAL EKG WITH PREVIOUS ONE FROM 2014-  OK TO PROCEED WITH SURGERY

## 2017-03-19 ENCOUNTER — Encounter: Payer: Self-pay | Admitting: *Deleted

## 2017-03-19 MED ORDER — CEFAZOLIN SODIUM-DEXTROSE 2-4 GM/100ML-% IV SOLN
2.0000 g | INTRAVENOUS | Status: AC
  Administered 2017-03-20: 2 g via INTRAVENOUS

## 2017-03-20 ENCOUNTER — Ambulatory Visit
Admission: RE | Admit: 2017-03-20 | Discharge: 2017-03-20 | Disposition: A | Discharge status: Home / Self Care | Payer: BLUE CROSS/BLUE SHIELD | Source: Ambulatory Visit | Attending: General Surgery | Admitting: General Surgery

## 2017-03-20 ENCOUNTER — Ambulatory Visit: Payer: BLUE CROSS/BLUE SHIELD | Admitting: Anesthesiology

## 2017-03-20 ENCOUNTER — Encounter
Admission: RE | Disposition: A | Discharge status: Home / Self Care | Payer: Self-pay | Source: Ambulatory Visit | Attending: General Surgery

## 2017-03-20 ENCOUNTER — Encounter: Payer: Self-pay | Admitting: Anesthesiology

## 2017-03-20 DIAGNOSIS — L732 Hidradenitis suppurativa: Secondary | ICD-10-CM

## 2017-03-20 DIAGNOSIS — F17210 Nicotine dependence, cigarettes, uncomplicated: Secondary | ICD-10-CM | POA: Insufficient documentation

## 2017-03-20 DIAGNOSIS — I10 Essential (primary) hypertension: Secondary | ICD-10-CM | POA: Insufficient documentation

## 2017-03-20 DIAGNOSIS — L02411 Cutaneous abscess of right axilla: Secondary | ICD-10-CM | POA: Diagnosis not present

## 2017-03-20 DIAGNOSIS — Z79899 Other long term (current) drug therapy: Secondary | ICD-10-CM | POA: Diagnosis not present

## 2017-03-20 HISTORY — PX: HYDRADENITIS EXCISION: SHX5243

## 2017-03-20 LAB — URINE DRUG SCREEN, QUALITATIVE (ARMC ONLY)
AMPHETAMINES, UR SCREEN: NOT DETECTED
Barbiturates, Ur Screen: NOT DETECTED
Benzodiazepine, Ur Scrn: NOT DETECTED
COCAINE METABOLITE, UR ~~LOC~~: NOT DETECTED
Cannabinoid 50 Ng, Ur ~~LOC~~: NOT DETECTED
MDMA (ECSTASY) UR SCREEN: NOT DETECTED
METHADONE SCREEN, URINE: NOT DETECTED
Opiate, Ur Screen: NOT DETECTED
Phencyclidine (PCP) Ur S: NOT DETECTED
TRICYCLIC, UR SCREEN: NOT DETECTED

## 2017-03-20 SURGERY — EXCISION, HIDRADENITIS, AXILLA
Anesthesia: General | Laterality: Right | Wound class: Clean Contaminated

## 2017-03-20 MED ORDER — LIDOCAINE HCL (PF) 2 % IJ SOLN
INTRAMUSCULAR | Status: AC
  Filled 2017-03-20: qty 6

## 2017-03-20 MED ORDER — HYDROCODONE-ACETAMINOPHEN 5-325 MG PO TABS: 1.0000 | ORAL_TABLET | ORAL | 0 refills | Status: DC | PRN

## 2017-03-20 MED ORDER — FENTANYL CITRATE (PF) 100 MCG/2ML IJ SOLN
INTRAMUSCULAR | Status: AC
  Filled 2017-03-20: qty 2

## 2017-03-20 MED ORDER — ACETAMINOPHEN 10 MG/ML IV SOLN
INTRAVENOUS | Status: DC | PRN
  Administered 2017-03-20: 1000 mg via INTRAVENOUS

## 2017-03-20 MED ORDER — FAMOTIDINE 20 MG PO TABS
20.0000 mg | ORAL_TABLET | Freq: Once | ORAL | Status: AC
  Administered 2017-03-20: 20 mg via ORAL

## 2017-03-20 MED ORDER — ONDANSETRON HCL 4 MG/2ML IJ SOLN: 4.0000 mg | Freq: Once | INTRAMUSCULAR | Status: DC | PRN

## 2017-03-20 MED ORDER — PROPOFOL 10 MG/ML IV BOLUS
INTRAVENOUS | Status: AC
  Filled 2017-03-20: qty 20

## 2017-03-20 MED ORDER — AMOXICILLIN-POT CLAVULANATE 875-125 MG PO TABS: 1.0000 | ORAL_TABLET | Freq: Two times a day (BID) | ORAL | 0 refills | Status: DC

## 2017-03-20 MED ORDER — FAMOTIDINE 20 MG PO TABS
ORAL_TABLET | ORAL | Status: AC
  Administered 2017-03-20: 20 mg via ORAL
  Filled 2017-03-20: qty 1

## 2017-03-20 MED ORDER — EPHEDRINE SULFATE 50 MG/ML IJ SOLN
INTRAMUSCULAR | Status: AC
  Filled 2017-03-20: qty 1

## 2017-03-20 MED ORDER — GLYCOPYRROLATE 0.2 MG/ML IJ SOLN
INTRAMUSCULAR | Status: AC
  Filled 2017-03-20: qty 1

## 2017-03-20 MED ORDER — SEVOFLURANE IN SOLN
RESPIRATORY_TRACT | Status: AC
  Filled 2017-03-20: qty 250

## 2017-03-20 MED ORDER — BUPIVACAINE HCL (PF) 0.5 % IJ SOLN
INTRAMUSCULAR | Status: DC | PRN
  Administered 2017-03-20: 30 mL

## 2017-03-20 MED ORDER — DEXAMETHASONE SODIUM PHOSPHATE 10 MG/ML IJ SOLN
INTRAMUSCULAR | Status: AC
  Filled 2017-03-20: qty 1

## 2017-03-20 MED ORDER — ACETAMINOPHEN 10 MG/ML IV SOLN
INTRAVENOUS | Status: AC
  Filled 2017-03-20: qty 100

## 2017-03-20 MED ORDER — FENTANYL CITRATE (PF) 100 MCG/2ML IJ SOLN: 25.0000 ug | INTRAMUSCULAR | Status: DC | PRN

## 2017-03-20 MED ORDER — ONDANSETRON HCL 4 MG/2ML IJ SOLN
INTRAMUSCULAR | Status: AC
  Filled 2017-03-20: qty 2

## 2017-03-20 MED ORDER — DEXAMETHASONE SODIUM PHOSPHATE 10 MG/ML IJ SOLN
INTRAMUSCULAR | Status: DC | PRN
  Administered 2017-03-20 (×2): 10 mg via INTRAVENOUS

## 2017-03-20 MED ORDER — ONDANSETRON HCL 4 MG/2ML IJ SOLN
INTRAMUSCULAR | Status: DC | PRN
  Administered 2017-03-20: 4 mg via INTRAVENOUS

## 2017-03-20 MED ORDER — LIDOCAINE HCL (CARDIAC) 20 MG/ML IV SOLN
INTRAVENOUS | Status: DC | PRN
  Administered 2017-03-20: 100 mg via INTRAVENOUS

## 2017-03-20 MED ORDER — KETOROLAC TROMETHAMINE 30 MG/ML IJ SOLN
INTRAMUSCULAR | Status: AC
  Filled 2017-03-20: qty 1

## 2017-03-20 MED ORDER — CEFAZOLIN SODIUM-DEXTROSE 2-4 GM/100ML-% IV SOLN
INTRAVENOUS | Status: AC
  Filled 2017-03-20: qty 100

## 2017-03-20 MED ORDER — SUCCINYLCHOLINE CHLORIDE 20 MG/ML IJ SOLN
INTRAMUSCULAR | Status: AC
  Filled 2017-03-20: qty 1

## 2017-03-20 MED ORDER — LACTATED RINGERS IV SOLN
INTRAVENOUS | Status: DC
  Administered 2017-03-20: 07:00:00 via INTRAVENOUS

## 2017-03-20 MED ORDER — PROPOFOL 10 MG/ML IV BOLUS
INTRAVENOUS | Status: DC | PRN
  Administered 2017-03-20: 150 mg via INTRAVENOUS
  Administered 2017-03-20: 50 mg via INTRAVENOUS

## 2017-03-20 MED ORDER — FENTANYL CITRATE (PF) 100 MCG/2ML IJ SOLN
INTRAMUSCULAR | Status: DC | PRN
  Administered 2017-03-20: 50 ug via INTRAVENOUS

## 2017-03-20 MED ORDER — HYDROCODONE-ACETAMINOPHEN 5-325 MG PO TABS
1.0000 | ORAL_TABLET | ORAL | Status: DC | PRN
  Administered 2017-03-20: 1 via ORAL

## 2017-03-20 MED ORDER — PHENYLEPHRINE HCL 10 MG/ML IJ SOLN
INTRAMUSCULAR | Status: AC
  Filled 2017-03-20: qty 1

## 2017-03-20 MED ORDER — SUCCINYLCHOLINE CHLORIDE 20 MG/ML IJ SOLN
INTRAMUSCULAR | Status: DC | PRN
  Administered 2017-03-20: 20 mg via INTRAVENOUS
  Administered 2017-03-20: 100 mg via INTRAVENOUS

## 2017-03-20 MED ORDER — KETOROLAC TROMETHAMINE 30 MG/ML IJ SOLN
INTRAMUSCULAR | Status: DC | PRN
  Administered 2017-03-20: 30 mg via INTRAVENOUS

## 2017-03-20 MED ORDER — HYDROCODONE-ACETAMINOPHEN 5-325 MG PO TABS
ORAL_TABLET | ORAL | Status: AC
  Filled 2017-03-20: qty 1

## 2017-03-20 MED ORDER — MIDAZOLAM HCL 2 MG/2ML IJ SOLN
INTRAMUSCULAR | Status: DC | PRN
  Administered 2017-03-20: 2 mg via INTRAVENOUS

## 2017-03-20 MED ORDER — BUPIVACAINE HCL (PF) 0.5 % IJ SOLN
INTRAMUSCULAR | Status: AC
  Filled 2017-03-20: qty 30

## 2017-03-20 MED ORDER — MIDAZOLAM HCL 2 MG/2ML IJ SOLN
INTRAMUSCULAR | Status: AC
  Filled 2017-03-20: qty 2

## 2017-03-20 SURGICAL SUPPLY — 30 items
CANISTER SUCT 1200ML W/VALVE (MISCELLANEOUS) ×2 IMPLANT
CHLORAPREP W/TINT 26ML (MISCELLANEOUS) ×2 IMPLANT
DRAPE LAPAROTOMY TRNSV 106X77 (MISCELLANEOUS) ×2 IMPLANT
DRSG OPSITE POSTOP 4X8 (GAUZE/BANDAGES/DRESSINGS) ×2 IMPLANT
DRSG TEGADERM 4X4.75 (GAUZE/BANDAGES/DRESSINGS) IMPLANT
DRSG TELFA 4X3 1S NADH ST (GAUZE/BANDAGES/DRESSINGS) IMPLANT
ELECT CAUTERY BLADE 6.4 (BLADE) ×2 IMPLANT
ELECT REM PT RETURN 9FT ADLT (ELECTROSURGICAL) ×2
ELECTRODE REM PT RTRN 9FT ADLT (ELECTROSURGICAL) ×1 IMPLANT
GLOVE BIO SURGEON STRL SZ7.5 (GLOVE) ×4 IMPLANT
GLOVE INDICATOR 8.0 STRL GRN (GLOVE) ×4 IMPLANT
GOWN STRL REUS W/ TWL LRG LVL3 (GOWN DISPOSABLE) ×2 IMPLANT
GOWN STRL REUS W/TWL LRG LVL3 (GOWN DISPOSABLE) ×2
KIT RM TURNOVER STRD PROC AR (KITS) ×2 IMPLANT
LABEL OR SOLS (LABEL) ×2 IMPLANT
NEEDLE HYPO 22GX1.5 SAFETY (NEEDLE) ×2 IMPLANT
NEEDLE HYPO 25X1 1.5 SAFETY (NEEDLE) IMPLANT
NS IRRIG 500ML POUR BTL (IV SOLUTION) ×2 IMPLANT
PACK BASIN MINOR ARMC (MISCELLANEOUS) ×2 IMPLANT
SHEARS HARMONIC 9CM CVD (BLADE) IMPLANT
STRIP CLOSURE SKIN 1/2X4 (GAUZE/BANDAGES/DRESSINGS) IMPLANT
SUT VIC AB 2-0 CT1 27 (SUTURE) ×2
SUT VIC AB 2-0 CT1 TAPERPNT 27 (SUTURE) ×2 IMPLANT
SUT VIC AB 3-0 54X BRD REEL (SUTURE) ×1 IMPLANT
SUT VIC AB 3-0 BRD 54 (SUTURE) ×1
SUT VIC AB 3-0 SH 27 (SUTURE) ×1
SUT VIC AB 3-0 SH 27X BRD (SUTURE) ×1 IMPLANT
SUT VIC AB 4-0 PS2 18 (SUTURE) ×2 IMPLANT
SWABSTK COMLB BENZOIN TINCTURE (MISCELLANEOUS) IMPLANT
SYR CONTROL 10ML (SYRINGE) ×2 IMPLANT

## 2017-03-20 NOTE — H&P (Signed)
No change in clinical history or exam. For excision of right axillary hidradenitis.

## 2017-03-20 NOTE — Anesthesia Procedure Notes (Signed)
Procedure Name: Intubation Date/Time: 03/20/2017 7:36 AM Performed by: Doreen Salvage Pre-anesthesia Checklist: Patient identified, Patient being monitored, Timeout performed, Emergency Drugs available and Suction available Patient Re-evaluated:Patient Re-evaluated prior to induction Oxygen Delivery Method: Circle system utilized Preoxygenation: Pre-oxygenation with 100% oxygen Induction Type: IV induction Ventilation: Mask ventilation without difficulty Laryngoscope Size: Mac and 3 Grade View: Grade I Tube type: Oral Tube size: 7.0 mm Number of attempts: 1 Airway Equipment and Method: Stylet Placement Confirmation: ETT inserted through vocal cords under direct vision,  positive ETCO2 and breath sounds checked- equal and bilateral Secured at: 21 cm Tube secured with: Tape Dental Injury: Teeth and Oropharynx as per pre-operative assessment

## 2017-03-20 NOTE — Anesthesia Post-op Follow-up Note (Signed)
Anesthesia QCDR form completed.        

## 2017-03-20 NOTE — Anesthesia Preprocedure Evaluation (Addendum)
Anesthesia Evaluation  Patient identified by MRN, date of birth, ID band Patient awake    Reviewed: Allergy & Precautions, NPO status , Patient's Chart, lab work & pertinent test results  Airway Mallampati: II  TM Distance: <3 FB     Dental  (+) Partial Upper   Pulmonary Current Smoker,    Pulmonary exam normal        Cardiovascular hypertension, Normal cardiovascular exam     Neuro/Psych  Neuromuscular disease    GI/Hepatic (+)     substance abuse  IV drug use, Hepatitis -, C  Endo/Other  negative endocrine ROS  Renal/GU negative Renal ROS  negative genitourinary   Musculoskeletal negative musculoskeletal ROS (+)   Abdominal Normal abdominal exam  (+)   Peds negative pediatric ROS (+)  Hematology negative hematology ROS (+)   Anesthesia Other Findings   Reproductive/Obstetrics                            Anesthesia Physical Anesthesia Plan  ASA: III  Anesthesia Plan: General   Post-op Pain Management:    Induction: Intravenous  PONV Risk Score and Plan:   Airway Management Planned: Oral ETT  Additional Equipment:   Intra-op Plan:   Post-operative Plan: Extubation in OR  Informed Consent: I have reviewed the patients History and Physical, chart, labs and discussed the procedure including the risks, benefits and alternatives for the proposed anesthesia with the patient or authorized representative who has indicated his/her understanding and acceptance.   Dental advisory given  Plan Discussed with: CRNA and Surgeon  Anesthesia Plan Comments:         Anesthesia Quick Evaluation

## 2017-03-20 NOTE — Op Note (Signed)
Preoperative diagnosis: Hidradenitis suppurativa right axilla.  Postoperative diagnosis: Same.  Operative procedure excision of right axillary hidradenitis with intermediate closure.  Operating surgeon: Ollen Bowl, M.D.  Anesthesia: Gen. endotracheal, Marcaine 0.5%, plain: 30 mL.  Estimated blood loss: 15 mL.  Clinical note: This 56 year old woman has been troubled with long-standing hidradenitis involving the right axilla. She is admitted for elective surgical excision.  Operative note: The patient received preoperative Kefzol. She underwent general endotracheal anesthesia without difficulty. A 5 x 10 cm ellipse was outlined to include all the inflammatory changes of the skin and the subdermal thickening noted on the inferior aspect of the inflammatory process in the axilla. Marcaine was infiltrated for postoperative analgesia. The skin was in size sharply and the remaining dissection completed with electrocautery. The inflammatory process was all superficial. The ellipse of skin was excised, orientated with anterior a marking and posterior to marking and sent in formalin for routine histology. The skin flaps were then elevated approximately 10-12 mm thick circumferentially. The deep aspect of this was then brought together with interrupted 2-0 Vicryl figure-of-eight sutures. The adipose layer was then brought together with interrupted 2-0 Vicryl sutures and the skin closed with a running 4 3-0 Vicryl subcuticular suture followed by staples. A honeycomb dressing was applied.  The patient tolerated the procedure well and was taken recovery room in stable condition.

## 2017-03-20 NOTE — Discharge Instructions (Signed)
AMBULATORY SURGERY  DISCHARGE INSTRUCTIONS   1) The drugs that you were given will stay in your system until tomorrow so for the next 24 hours you should not:  A) Drive an automobile B) Make any legal decisions C) Drink any alcoholic beverage   2) You may resume regular meals tomorrow.  Today it is better to start with liquids and gradually work up to solid foods.  You may eat anything you prefer, but it is better to start with liquids, then soup and crackers, and gradually work up to solid foods.   3) Please notify your doctor immediately if you have any unusual bleeding, trouble breathing, redness and pain at the surgery site, drainage, fever, or pain not relieved by medication.    4) Additional Instructions:DRINK plenty of fluids today.  Utilize the ice pack under your arm for 20 minutes an hour while awake, over the next 48 to 72 hours. Keep a pillow under your arm to minimize any discomfort.     Please contact your physician with any problems or Same Day Surgery at [phone], Monday through Friday 6 am to 4 pm, or [place] at [facility] number at [phone].

## 2017-03-20 NOTE — Transfer of Care (Signed)
Immediate Anesthesia Transfer of Care Note  Patient: Melissa Garrison  Procedure(s) Performed: Procedure(s): EXCISION HIDRADENITIS AXILLA (Right)  Patient Location: PACU  Anesthesia Type:General  Level of Consciousness: sedated  Airway & Oxygen Therapy: Patient Spontanous Breathing and Patient connected to face mask oxygen  Post-op Assessment: Report given to RN and Post -op Vital signs reviewed and stable  Post vital signs: Reviewed and stable  Last Vitals:  Vitals:   03/20/17 0614 03/20/17 0825  BP: (!) 166/79 (!) 144/85  Pulse: 65 97  Resp: 17 14  Temp: (!) 35.7 C (!) 36.2 C  SpO2: 491% 791%    Complications: No apparent anesthesia complications

## 2017-03-20 NOTE — Anesthesia Postprocedure Evaluation (Signed)
Anesthesia Post Note  Patient: Melissa Garrison  Procedure(s) Performed: Procedure(s) (LRB): EXCISION HIDRADENITIS AXILLA (Right)  Patient location during evaluation: PACU Anesthesia Type: General Level of consciousness: awake and alert and oriented Pain management: pain level controlled Vital Signs Assessment: post-procedure vital signs reviewed and stable Respiratory status: spontaneous breathing Cardiovascular status: blood pressure returned to baseline Anesthetic complications: no     Last Vitals:  Vitals:   03/20/17 0933 03/20/17 1002  BP: (!) 162/92 (!) 174/76  Pulse: 72   Resp: 16 15  Temp: (!) 36.3 C   SpO2: 98% 99%    Last Pain:  Vitals:   03/20/17 1002  TempSrc:   PainSc: 4                  Bjorn Hallas

## 2017-03-21 ENCOUNTER — Encounter: Payer: Self-pay | Admitting: General Surgery

## 2017-03-23 LAB — SURGICAL PATHOLOGY

## 2017-03-24 ENCOUNTER — Telehealth: Payer: Self-pay | Admitting: *Deleted

## 2017-03-24 NOTE — Telephone Encounter (Signed)
Called to see how she was doing and ask about her FMLA papers, return to work date?

## 2017-03-25 NOTE — Telephone Encounter (Signed)
RTW in 3-4 weeks

## 2017-03-30 ENCOUNTER — Ambulatory Visit (INDEPENDENT_AMBULATORY_CARE_PROVIDER_SITE_OTHER): Payer: BLUE CROSS/BLUE SHIELD | Admitting: *Deleted

## 2017-03-30 DIAGNOSIS — L732 Hidradenitis suppurativa: Secondary | ICD-10-CM

## 2017-03-30 NOTE — Progress Notes (Signed)
Patient came in today for a wound check.  The wound is clean, with no signs of infection noted. There is an open area midway up the right axilla next to incision site. The staples were removed and steri strips applied. Follow up as scheduled 04/06/2017.

## 2017-03-30 NOTE — Patient Instructions (Signed)
Return as scheduled 

## 2017-04-01 ENCOUNTER — Telehealth: Payer: Self-pay | Admitting: *Deleted

## 2017-04-01 NOTE — Telephone Encounter (Signed)
I talked with the patient and she states the area is doing "good". She states she went and bought butterfly strips [facility] and placed them on the un-open portion of the incision. Offered nurse visit, pt declines. She will continue to monitor the area and call if she notices changes, follow up as scheduled on Monday.

## 2017-04-01 NOTE — Telephone Encounter (Signed)
Patient came on Monday to get her staples removed and yesterday she stated that she was doing some housework and the incision under her right arm came open a little bit. Its not bleeding or draining but she is worried about it. She put peroxide around the area and covered it was a bandage.

## 2017-04-06 ENCOUNTER — Ambulatory Visit (INDEPENDENT_AMBULATORY_CARE_PROVIDER_SITE_OTHER): Payer: BLUE CROSS/BLUE SHIELD | Admitting: General Surgery

## 2017-04-06 ENCOUNTER — Encounter: Payer: Self-pay | Admitting: *Deleted

## 2017-04-06 VITALS — BP 124/78 | HR 72 | Resp 14 | Ht 61.0 in | Wt 171.0 lb

## 2017-04-06 DIAGNOSIS — L732 Hidradenitis suppurativa: Secondary | ICD-10-CM

## 2017-04-06 NOTE — Progress Notes (Signed)
Patient ID: Melissa Garrison, female   DOB: Oct 27, 1960, 56 y.o.   MRN: [id number]  Chief Complaint  Patient presents with  . Routine Post Op    HPI Melissa Garrison is a 56 y.o. female here for a post op right axillary hidradenitis surgery done on 03/20/17. She states that the area opened on 03/30/17. She has some drainage from the area.  HPI  Past Medical History:  Diagnosis Date  . Allergic rhinitis 07/11/2011  . Hepatitis C    Dx: 2011 Dr Jerilee Hoh STATES SHE WENT THROUGH TREATMENT AND STATES HEP C IS NOW RESOLVED   . Hepatitis C, chronic ([place]) 07/11/2011  . HTN (hypertension)   . Hypertension 07/11/2011  . IV drug abuse    HISTORY - greater than 10 years    Past Surgical History:  Procedure Laterality Date  . BREAST BIOPSY Left 12/05/2015   stereotactic biopsy- benign  . CHOLECYSTECTOMY  2011  . HUMERUS SURGERY     due to fracture  . HYDRADENITIS EXCISION Right 03/20/2017   Procedure: EXCISION HIDRADENITIS AXILLA;  Surgeon: Robert Bellow, MD;  Location: [facility] ORS;  Service: General;  Laterality: Right;  . LAPAROSCOPIC HYSTERECTOMY      Family History  Problem Relation Age of Onset  . Hypertension Mother   . Diabetes Father   . Hypertension Brother   . Breast cancer Neg Hx   . Colon cancer Neg Hx     Social History Social History  Substance Use Topics  . Smoking status: Current Every Day Smoker    Packs/day: 0.50    Years: 10.00    Types: Cigarettes  . Smokeless tobacco: Never Used     Comment: 4/day  . Alcohol use Yes     Comment: occ    No Known Allergies  Current Outpatient Prescriptions  Medication Sig Dispense Refill  . amoxicillin-clavulanate (AUGMENTIN) 875-125 MG tablet Take 1 tablet by mouth 2 (two) times daily. 14 tablet 0  . ergocalciferol (VITAMIN D2) 50000 units capsule Take 1 capsule (50,000 Units total) by mouth once a week. 12 capsule 0  . HYDROcodone-acetaminophen (NORCO) 5-325 MG tablet Take 1-2 tablets by mouth every 4 (four) hours as  needed for moderate pain. Take no more than 8 tablets daily. 30 tablet 0  . ibuprofen (ADVIL,MOTRIN) 800 MG tablet Take 800 mg by mouth every 8 (eight) hours as needed.    Marland Kitchen lisinopril (PRINIVIL,ZESTRIL) 20 MG tablet Take 1 tablet (20 mg total) by mouth daily. (Patient taking differently: Take 20 mg by mouth every morning. ) 90 tablet 3   No current facility-administered medications for this visit.     Review of Systems Review of Systems  Constitutional: Negative.   Respiratory: Negative.   Cardiovascular: Negative.     Blood pressure 124/78, pulse 72, resp. rate 14, height 5\' 1"  (1.549 m), weight 171 lb (77.6 kg).  Physical Exam Physical Exam  Constitutional: She is oriented to person, place, and time. She appears well-developed and well-nourished.  Pulmonary/Chest:    Lymphadenopathy:  Right axilla opening is healing .   Neurological: She is alert and oriented to person, place, and time.  Skin: Skin is warm and dry.    Data Reviewed  03/10/2017 surgical excision specimen DIAGNOSIS:  A. SKIN, RIGHT AXILLA; EXCISION:  - CONSISTENT WITH HIDRADENITIS SUPPURATIVA.   Assessment     Wound separation status post hidradenitis excision.    Plan    The patient will continue showering to cleanse the area. Efforts to improve her  shoulder range of motion reviewed. Local heat for comfort.      Patient to return in two weeks.Patient to use shower head to that area.  The patient is aware to call back for any questions or concerns.   HPI, Physical Exam, Assessment and Plan have been scribed under the direction and in the presence of Robert Bellow, MD  Concepcion Living, LPN  I have completed the exam and reviewed the above documentation for accuracy and completeness.  I agree with the above.  Haematologist has been used and any errors in dictation or transcription are unintentional.  Hervey Ard, M.D., F.A.C.S.  Robert Bellow 04/06/2017, 9:33 AM

## 2017-04-06 NOTE — Patient Instructions (Addendum)
Patient to return in two weeks.Patient to use shower head to that area.  The patient is aware to call back for any questions or concerns

## 2017-04-20 ENCOUNTER — Ambulatory Visit (INDEPENDENT_AMBULATORY_CARE_PROVIDER_SITE_OTHER): Payer: BLUE CROSS/BLUE SHIELD | Admitting: General Surgery

## 2017-04-20 ENCOUNTER — Encounter: Payer: Self-pay | Admitting: General Surgery

## 2017-04-20 VITALS — BP 130/74 | HR 74 | Resp 12 | Ht 61.0 in | Wt 180.0 lb

## 2017-04-20 DIAGNOSIS — L732 Hidradenitis suppurativa: Secondary | ICD-10-CM

## 2017-04-20 NOTE — Patient Instructions (Signed)
Patient to return in 2 weeks to see physician

## 2017-04-20 NOTE — Progress Notes (Signed)
Patient ID: Melissa Garrison, female   DOB: [birth year], 56 y.o.   MRN: [id number]  Chief Complaint  Patient presents with  . Follow-up    HPI Melissa Garrison is a 56 y.o. female.here today for her one month follow up right axillary hidradenitis done on 03/20/2017. She states the area is not draining much no more. HPI  Past Medical History:  Diagnosis Date  . Allergic rhinitis 07/11/2011  . Hepatitis C    Dx: 2011 Dr Jerilee Hoh STATES SHE WENT THROUGH TREATMENT AND STATES HEP C IS NOW RESOLVED   . Hepatitis C, chronic ([place]) 07/11/2011  . HTN (hypertension)   . Hypertension 07/11/2011  . IV drug abuse ([place])    HISTORY - greater than 10 years    Past Surgical History:  Procedure Laterality Date  . BREAST BIOPSY Left 12/05/2015   stereotactic biopsy- benign  . CHOLECYSTECTOMY  2011  . HUMERUS SURGERY     due to fracture  . HYDRADENITIS EXCISION Right 03/20/2017   Procedure: EXCISION HIDRADENITIS AXILLA;  Surgeon: Robert Bellow, MD;  Location: [facility] ORS;  Service: General;  Laterality: Right;  . LAPAROSCOPIC HYSTERECTOMY      Family History  Problem Relation Age of Onset  . Hypertension Mother   . Diabetes Father   . Hypertension Brother   . Breast cancer Neg Hx   . Colon cancer Neg Hx     Social History Social History  Substance Use Topics  . Smoking status: Current Every Day Smoker    Packs/day: 0.50    Years: 10.00    Types: Cigarettes  . Smokeless tobacco: Never Used     Comment: 4/day  . Alcohol use Yes     Comment: occ    No Known Allergies  Current Outpatient Prescriptions  Medication Sig Dispense Refill  . ergocalciferol (VITAMIN D2) 50000 units capsule Take 1 capsule (50,000 Units total) by mouth once a week. 12 capsule 0  . ibuprofen (ADVIL,MOTRIN) 800 MG tablet Take 800 mg by mouth every 8 (eight) hours as needed.    Marland Kitchen lisinopril (PRINIVIL,ZESTRIL) 20 MG tablet Take 1 tablet (20 mg total) by mouth daily. (Patient taking differently: Take 20 mg by  mouth every morning. ) 90 tablet 3   No current facility-administered medications for this visit.     Review of Systems Review of Systems  Constitutional: Negative.   Respiratory: Negative.   Cardiovascular: Negative.     Blood pressure 130/74, pulse 74, resp. rate 12, height 5\' 1"  (1.549 m), weight 180 lb (81.6 kg).  Physical Exam Physical Exam  Constitutional: She is oriented to person, place, and time. She appears well-developed and well-nourished.  Pulmonary/Chest:    Neurological: She is alert and oriented to person, place, and time.  Skin: Skin is warm and dry.      Assessment    Steady improvement in right axillary healing.    Plan    Patient reports she is unable to do her job which involves reaching over her head repetitively at this time.    Patient to return in 2 weeks to see physician. May return to work on 05/05/2017  HPI, Physical Exam, Assessment and Plan have been scribed under the direction and in the presence of Hervey Ard, MD.  Gaspar Cola, CMA  I have completed the exam and reviewed the above documentation for accuracy and completeness.  I agree with the above.  Haematologist has been used and any errors in dictation or transcription are unintentional.  Dellis Filbert  Bary Castilla, M.D., F.A.C.S.  Robert Bellow 04/21/2017, 9:28 AM

## 2017-04-24 ENCOUNTER — Telehealth: Payer: Self-pay | Admitting: *Deleted

## 2017-04-24 NOTE — Telephone Encounter (Signed)
Patient called because she stated that her work has not received her disability papers yet. I told her that you wouldn't be back in the office until Tuesday and that you would give her a call then.

## 2017-04-28 NOTE — Telephone Encounter (Signed)
FMLA paperwork was received. Pt states she is doing well.

## 2017-05-04 ENCOUNTER — Encounter: Payer: Self-pay | Admitting: General Surgery

## 2017-05-04 ENCOUNTER — Ambulatory Visit (INDEPENDENT_AMBULATORY_CARE_PROVIDER_SITE_OTHER): Payer: BLUE CROSS/BLUE SHIELD | Admitting: General Surgery

## 2017-05-04 VITALS — BP 144/82 | HR 97 | Resp 14 | Ht 61.0 in | Wt 174.0 lb

## 2017-05-04 DIAGNOSIS — L732 Hidradenitis suppurativa: Secondary | ICD-10-CM

## 2017-05-04 NOTE — Progress Notes (Signed)
Patient ID: Melissa Garrison, female   DOB: 12/19/1960, 56 y.o.   MRN: [id number]  Chief Complaint  Patient presents with  . Follow-up    HPI Melissa Garrison is a 56 y.o. female here today for her follow up right axillary hidradenitis. Patient states the area is doing well, not much drainage.   HPI  Past Medical History:  Diagnosis Date  . Allergic rhinitis 07/11/2011  . Hepatitis C    Dx: 2011 Dr Jerilee Hoh STATES SHE WENT THROUGH TREATMENT AND STATES HEP C IS NOW RESOLVED   . Hepatitis C, chronic ([place]) 07/11/2011  . HTN (hypertension)   . Hypertension 07/11/2011  . IV drug abuse ([place])    HISTORY - greater than 10 years    Past Surgical History:  Procedure Laterality Date  . BREAST BIOPSY Left 12/05/2015   stereotactic biopsy- benign  . CHOLECYSTECTOMY  2011  . HUMERUS SURGERY     due to fracture  . LAPAROSCOPIC HYSTERECTOMY      Family History  Problem Relation Age of Onset  . Hypertension Mother   . Diabetes Father   . Hypertension Brother   . Breast cancer Neg Hx   . Colon cancer Neg Hx     Social History Social History   Tobacco Use  . Smoking status: Current Every Day Smoker    Packs/day: 0.50    Years: 10.00    Pack years: 5.00    Types: Cigarettes  . Smokeless tobacco: Never Used  . Tobacco comment: 4/day  Substance Use Topics  . Alcohol use: Yes    Comment: occ  . Drug use: No    Comment: h/o iv drug use years ago    No Known Allergies  Current Outpatient Medications  Medication Sig Dispense Refill  . ergocalciferol (VITAMIN D2) 50000 units capsule Take 1 capsule (50,000 Units total) by mouth once a week. 12 capsule 0  . ibuprofen (ADVIL,MOTRIN) 800 MG tablet Take 800 mg by mouth every 8 (eight) hours as needed.    Marland Kitchen lisinopril (PRINIVIL,ZESTRIL) 20 MG tablet Take 1 tablet (20 mg total) by mouth daily. (Patient taking differently: Take 20 mg by mouth every morning. ) 90 tablet 3   No current facility-administered medications for this visit.      Review of Systems Review of Systems  Constitutional: Negative.   Respiratory: Negative.   Cardiovascular: Negative.     Blood pressure (!) 144/82, pulse 97, resp. rate 14, height 5\' 1"  (1.549 m), weight 174 lb (78.9 kg).  Physical Exam Physical Exam  Pulmonary/Chest:         Assessment    Doing well status post excision right axillary hidradenitis.    Plan        Patient to return in three weeks. Return to work 05/06/2017..Use some vaseline on the area.  The patient is aware to call back for any questions or concerns.   HPI, Physical Exam, Assessment and Plan have been scribed under the direction and in the presence of Hervey Ard, MD.  Gaspar Cola, CMA   I have completed the exam and reviewed the above documentation for accuracy and completeness.  I agree with the above.  Haematologist has been used and any errors in dictation or transcription are unintentional.  Hervey Ard, M.D., F.A.C.S. Robert Bellow 05/04/2017, 10:53 AM

## 2017-05-04 NOTE — Patient Instructions (Addendum)
Return ion three weeks, use Vaseline on the area

## 2017-05-26 ENCOUNTER — Ambulatory Visit (INDEPENDENT_AMBULATORY_CARE_PROVIDER_SITE_OTHER): Payer: BLUE CROSS/BLUE SHIELD | Admitting: General Surgery

## 2017-05-26 ENCOUNTER — Encounter: Payer: Self-pay | Admitting: General Surgery

## 2017-05-26 VITALS — BP 150/72 | HR 78 | Resp 12 | Ht 62.0 in | Wt 175.0 lb

## 2017-05-26 DIAGNOSIS — L732 Hidradenitis suppurativa: Secondary | ICD-10-CM

## 2017-05-26 NOTE — Patient Instructions (Signed)
Patient to return as needed. The patient is aware to call back for any questions or concerns. 

## 2017-05-26 NOTE — Progress Notes (Signed)
Patient ID: Melissa Garrison, female   DOB: [birth year], 56 y.o.   MRN: [id number]  Chief Complaint  Patient presents with  . Follow-up    HPI Melissa Garrison is a 56 y.o. female here today for her follow up hidradenitis. Patient states the area is doing well. No draining.  HPI  Past Medical History:  Diagnosis Date  . Allergic rhinitis 07/11/2011  . Hepatitis C    Dx: 2011 Dr Jerilee Hoh STATES SHE WENT THROUGH TREATMENT AND STATES HEP C IS NOW RESOLVED   . Hepatitis C, chronic ([place]) 07/11/2011  . HTN (hypertension)   . Hypertension 07/11/2011  . IV drug abuse ([place])    HISTORY - greater than 10 years    Past Surgical History:  Procedure Laterality Date  . BREAST BIOPSY Left 12/05/2015   stereotactic biopsy- benign  . CHOLECYSTECTOMY  2011  . HUMERUS SURGERY     due to fracture  . HYDRADENITIS EXCISION Right 03/20/2017   Procedure: EXCISION HIDRADENITIS AXILLA;  Surgeon: Robert Bellow, MD;  Location: [facility] ORS;  Service: General;  Laterality: Right;  . LAPAROSCOPIC HYSTERECTOMY      Family History  Problem Relation Age of Onset  . Hypertension Mother   . Diabetes Father   . Hypertension Brother   . Breast cancer Neg Hx   . Colon cancer Neg Hx     Social History Social History   Tobacco Use  . Smoking status: Current Every Day Smoker    Packs/day: 0.50    Years: 10.00    Pack years: 5.00    Types: Cigarettes  . Smokeless tobacco: Never Used  . Tobacco comment: 4/day  Substance Use Topics  . Alcohol use: Yes    Comment: occ  . Drug use: No    Comment: h/o iv drug use years ago    No Known Allergies  Current Outpatient Medications  Medication Sig Dispense Refill  . ergocalciferol (VITAMIN D2) 50000 units capsule Take 1 capsule (50,000 Units total) by mouth once a week. 12 capsule 0  . ibuprofen (ADVIL,MOTRIN) 800 MG tablet Take 800 mg by mouth every 8 (eight) hours as needed.    Marland Kitchen lisinopril (PRINIVIL,ZESTRIL) 20 MG tablet Take 1 tablet (20 mg total) by  mouth daily. (Patient taking differently: Take 20 mg by mouth every morning. ) 90 tablet 3   No current facility-administered medications for this visit.     Review of Systems Review of Systems  Constitutional: Negative.   Respiratory: Negative.   Cardiovascular: Negative.     Blood pressure (!) 150/72, pulse 78, resp. rate 12, height 5\' 2"  (1.575 m), weight 175 lb (79.4 kg).  Physical Exam Physical Exam  Constitutional: She is oriented to person, place, and time. She appears well-developed and well-nourished.  Pulmonary/Chest:    Neurological: She is alert and oriented to person, place, and time.  Skin: Skin is warm and dry.      Assessment    Near complete healing of right axillary area status post hidradenitis excision.    Plan         Patient to return as needed.The patient is aware to call back for any questions or concerns.  HPI, Physical Exam, Assessment and Plan have been scribed under the direction and in the presence of Hervey Ard, MD.  Gaspar Cola, CMA  ,asa  Robert Bellow 05/26/2017, 8:57 PM

## 2017-07-03 IMAGING — MG MM PLC BREAST LOC DEV 1ST LESION INC STEREO GUIDE*L*
1 series · 6 of 6 positions shown · non-contrast
Comparison: Previous exams.

ADDENDUM:
Pathology of the left breast biopsy revealed FIBROADENOMATOUS
CHANGES WITH STROMAL HYALINIZATION AND CALCIFICATIONS. NEGATIVE FOR
ATYPIA AND MALIGNANCY. This was found to be concordant by Dr.
Analia.

Recommendations: Recommend a six month follow-up mammogram for an
additional area of probably benign calcifications in the left
breast.
At the patient's request, pathology and recommendations were relayed
to the patient by phone. She stated she has done well following the
biopsy with no bleeding, bruising, or hematoma. Post biopsy
instructions were reviewed with the patient. All of her questions
were answered. She was encouraged to call the [REDACTED] with any further questions or
concerns.
Pathology and recommendations relayed by Hinkle, Nohemy on
12/07/15.
CLINICAL DATA: Patient with suspicious left breast calcifications
presents today for stereotactic guided biopsy.
EXAM:
LEFT BREAST STEREOTACTIC CORE NEEDLE BIOPSY

[Series 1: L · left · 0.10mm/px · 6 of 6 slices shown]
[im 1/6]
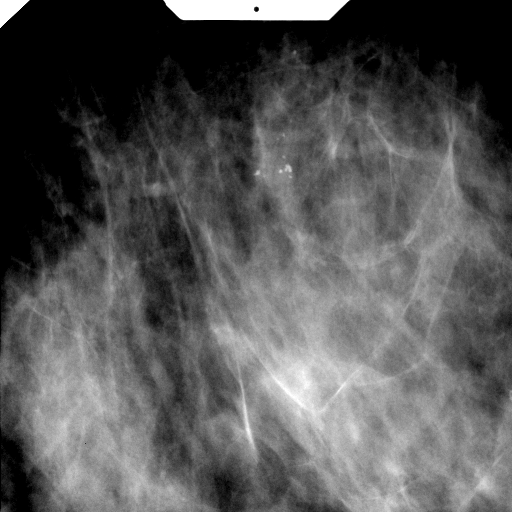
[im 2/6]
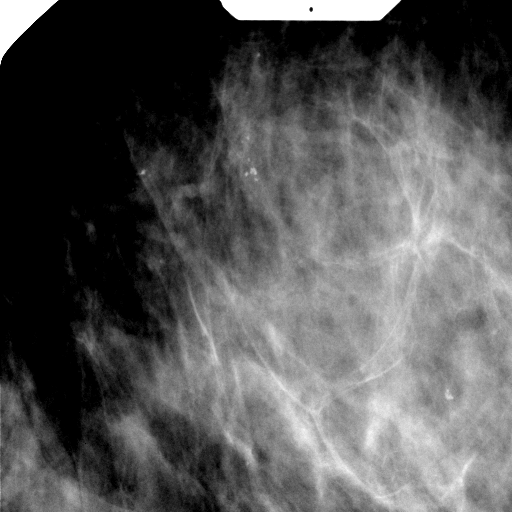
[im 3/6]
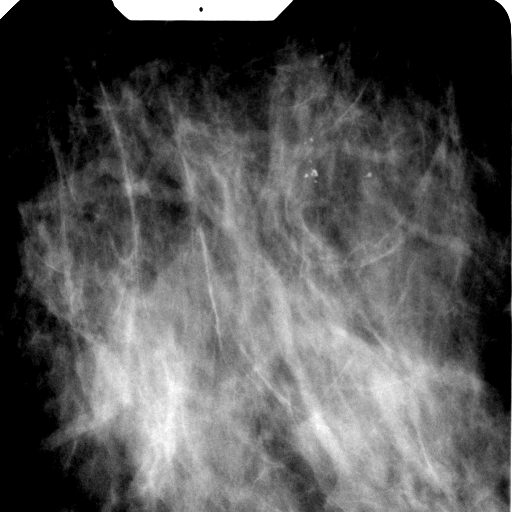
[im 4/6]
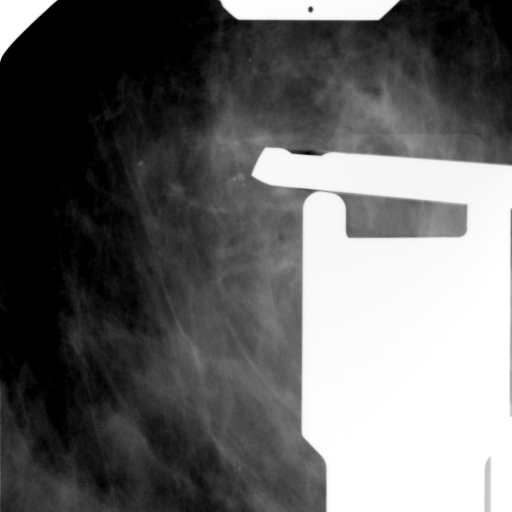
[im 5/6]
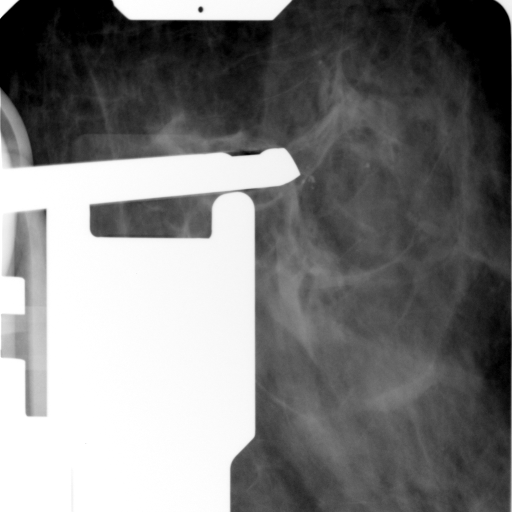
[im 6/6]
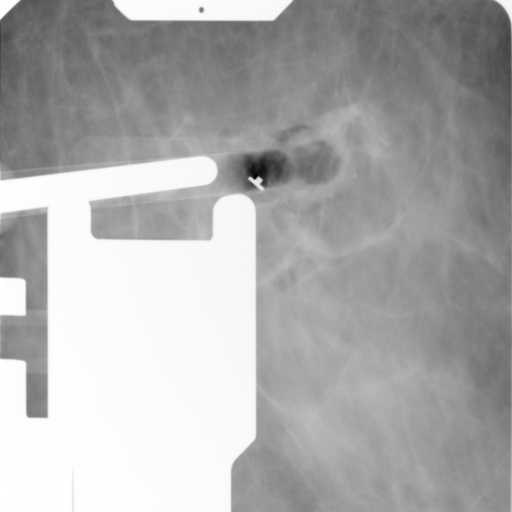

[6 of 6 positions shown; findings below may reference images not displayed]



Using sterile technique and 1% Lidocaine as local anesthetic, under
stereotactic guidance, a 9 gauge vacuum assisted device was used to
perform core needle biopsy of calcifications in the upper outer
quadrant of the left breast using a lateral approach. Specimen
radiograph was performed showing calcifications. Specimens with
calcifications are identified for pathology.

At the conclusion of the procedure, a top hat shaped tissue marker
clip was deployed into the biopsy cavity. Follow-up 2-view mammogram
was performed and dictated separately.
IMPRESSION: Stereotactic-guided biopsy of left breast calcifications. No
apparent complications.

## 2017-08-25 ENCOUNTER — Telehealth: Payer: Self-pay | Admitting: Family

## 2017-09-04 NOTE — Telephone Encounter (Signed)
close

## 2017-10-27 ENCOUNTER — Encounter: Payer: Self-pay | Admitting: Family

## 2017-10-27 DIAGNOSIS — Z1231 Encounter for screening mammogram for malignant neoplasm of breast: Secondary | ICD-10-CM | POA: Diagnosis not present

## 2017-11-04 ENCOUNTER — Telehealth: Payer: Self-pay | Admitting: Family

## 2017-11-04 DIAGNOSIS — Z1231 Encounter for screening mammogram for malignant neoplasm of breast: Secondary | ICD-10-CM

## 2017-11-04 NOTE — Telephone Encounter (Signed)
Orders placed, patient advised she will call to schedule

## 2017-11-04 NOTE — Telephone Encounter (Signed)
Please advise 

## 2017-11-04 NOTE — Telephone Encounter (Signed)
Copied from [place] [id number]. Topic: Quick Communication - See Telephone Encounter >> Nov 04, 2017 10:35 AM Bea Graff, NT wrote: CRM for notification. See Telephone encounter for: 11/04/17. Pt states she needs an order for a mammogram. She goes to the [facility].

## 2017-11-13 ENCOUNTER — Telehealth: Payer: Self-pay | Admitting: Family

## 2017-11-13 NOTE — Telephone Encounter (Signed)
Patient advised of below , has appointment 12/04/17

## 2017-11-13 NOTE — Telephone Encounter (Signed)
Call pt  I rec'd labs. I am concerned with her cholesterol and vitamin d and also havent seen her in over a year. She also appears prediabetic.   We can review labs in person.    Please make an appt.

## 2017-11-18 ENCOUNTER — Other Ambulatory Visit: Payer: Self-pay | Admitting: Family

## 2017-11-18 ENCOUNTER — Inpatient Hospital Stay
Admission: RE | Admit: 2017-11-18 | Discharge: 2017-11-18 | Disposition: A | Discharge status: Home / Self Care | Payer: Self-pay | Source: Ambulatory Visit | Attending: *Deleted | Admitting: *Deleted

## 2017-11-18 ENCOUNTER — Other Ambulatory Visit: Payer: Self-pay | Admitting: *Deleted

## 2017-11-18 DIAGNOSIS — N6489 Other specified disorders of breast: Secondary | ICD-10-CM

## 2017-11-18 DIAGNOSIS — Z9289 Personal history of other medical treatment: Secondary | ICD-10-CM

## 2017-11-25 NOTE — Progress Notes (Signed)
Subjective:    Patient ID: Melissa Garrison, female    DOB: 14-Jan-1961, 57 y.o.   MRN: [id number]  CC: Melissa Garrison is a 57 y.o. female who presents today for physical exam.    HPI:   H/o elevated blood sugar- No prior history of DM.  has been working on weight; has lost weight with walking.   HTN- compliant with lisinopril. At work, 130/70. Caffeine during night shift last night.  Denies exertional chest pain or pressure, numbness or tingling radiating to left arm or jaw, palpitations, dizziness, frequent headaches, changes in vision, or shortness of breath.   Currently smokes 3 cigarettes a day.  Tried Chantix in the past and did not like the medication.    Colorectal Cancer Screening: UTD  Breast Cancer Screening: Mammogram UTD Cervical Cancer Screening: due Bone Health screening/DEXA for 65+: No increased fracture risk. Defer screening at this time. Lung Cancer Screening:consents  Immunizations       Tetanus - utd        Pneumococcal - Candidate for ;declines HIV Screening- Candidate for , declines Labs: Screening labs today. Exercise: Gets regular exercise, starting walking Alcohol use: 4  Beers per night. Never been told drinking was annoying. No eye openers. Functioning with work.  Smoking/tobacco use: smoker.    HISTORY:  Past Medical History:  Diagnosis Date  . Allergic rhinitis 07/11/2011  . Hepatitis C    Dx: 2011 Dr Jerilee Hoh STATES SHE WENT THROUGH TREATMENT AND STATES HEP C IS NOW RESOLVED   . Hepatitis C, chronic ([place]) 07/11/2011  . HTN (hypertension)   . Hypertension 07/11/2011  . IV drug abuse ([place])    HISTORY - greater than 10 years    Past Surgical History:  Procedure Laterality Date  . BREAST BIOPSY Left 12/05/2015   stereotactic biopsy- benign  . CHOLECYSTECTOMY  2011  . HUMERUS SURGERY     due to fracture  . HYDRADENITIS EXCISION Right 03/20/2017   Procedure: EXCISION HIDRADENITIS AXILLA;  Surgeon: Robert Bellow, MD;  Location: [facility]  ORS;  Service: General;  Laterality: Right;  . LAPAROSCOPIC HYSTERECTOMY     Family History  Problem Relation Age of Onset  . Hypertension Mother   . Diabetes Father   . Hypertension Brother   . Breast cancer Neg Hx   . Colon cancer Neg Hx       ALLERGIES: Patient has no known allergies.  No current outpatient medications on file prior to visit.   No current facility-administered medications on file prior to visit.     Social History   Tobacco Use  . Smoking status: Current Every Day Smoker    Packs/day: 0.50    Years: 10.00    Pack years: 5.00    Types: Cigarettes  . Smokeless tobacco: Never Used  . Tobacco comment: 4/day  Substance Use Topics  . Alcohol use: Yes    Comment: 3-4 beers per day.   . Drug use: No    Comment: h/o iv drug use years ago    Review of Systems  Constitutional: Negative for chills, fever and unexpected weight change.  HENT: Negative for congestion.   Respiratory: Negative for cough.   Cardiovascular: Negative for chest pain, palpitations and leg swelling.  Gastrointestinal: Negative for nausea and vomiting.  Genitourinary: Negative for difficulty urinating, dysuria and vaginal bleeding.  Musculoskeletal: Negative for arthralgias and myalgias.  Skin: Positive for wound (papules in groin from shaving). Negative for rash.  Neurological: Negative for headaches.  Hematological: Negative  for adenopathy.  Psychiatric/Behavioral: Negative for confusion.      Objective:    BP 138/82 (BP Location: Left Arm, Patient Position: Sitting, Cuff Size: Normal)   Pulse 81   Temp (!) 97.5 F (36.4 C) (Oral)   Resp 16   Wt 172 lb 8 oz (78.2 kg)   SpO2 99%   BMI 31.55 kg/m   BP Readings from Last 3 Encounters:  12/04/17 138/82  05/26/17 (!) 150/72  05/04/17 (!) 144/82   Wt Readings from Last 3 Encounters:  12/04/17 172 lb 8 oz (78.2 kg)  05/26/17 175 lb (79.4 kg)  05/04/17 174 lb (78.9 kg)    Physical Exam  Constitutional: She appears  well-developed and well-nourished.  Eyes: Conjunctivae are normal.  Neck: No thyroid mass and no thyromegaly present.  Cardiovascular: Normal rate, regular rhythm, normal heart sounds and normal pulses.  Pulmonary/Chest: Effort normal and breath sounds normal. She has no wheezes. She has no rhonchi. She has no rales. Right breast exhibits no inverted nipple, no mass, no nipple discharge, no skin change and no tenderness. Left breast exhibits no inverted nipple, no mass, no nipple discharge, no skin change and no tenderness. Breasts are symmetrical.  No masses or asymmetry appreciated during CBE.  Abdominal:    Papule noted on left side of groin. No purulent discharge, streaking, increased erythema.   Genitourinary: Uterus is not enlarged, not fixed and not tender. Right adnexum displays no mass, no tenderness and no fullness. Left adnexum displays no mass, no tenderness and no fullness.  Genitourinary Comments: Pap performed.  No cervix visualized. No CMT. Unable to appreciated ovaries.  Lymphadenopathy:       Head (right side): No submental, no submandibular, no tonsillar, no preauricular, no posterior auricular and no occipital adenopathy present.       Head (left side): No submental, no submandibular, no tonsillar, no preauricular, no posterior auricular and no occipital adenopathy present.       Right cervical: No superficial cervical, no deep cervical and no posterior cervical adenopathy present.      Left cervical: No superficial cervical, no deep cervical and no posterior cervical adenopathy present.    She has no axillary adenopathy.       Right axillary: No pectoral and no lateral adenopathy present.       Left axillary: No pectoral and no lateral adenopathy present. Neurological: She is alert.  Skin: Skin is warm and dry.  Psychiatric: She has a normal mood and affect. Her speech is normal and behavior is normal. Thought content normal.  Vitals reviewed.      Assessment & Plan:     Problem List Items Addressed This Visit      Cardiovascular and Mediastinum   Hypertension    At goal. Continue current regimen.      Relevant Medications   lisinopril (PRINIVIL,ZESTRIL) 20 MG tablet     Digestive   Hepatitis C, chronic (HCC)    Pending re -establishment with GI.       Relevant Medications   mupirocin cream (BACTROBAN) 2 %   Other Relevant Orders   Ambulatory referral to Gastroenterology     Endocrine   Diabetes mellitus without complication ([place])    New. Declines metformin today. Pending nutrition consult. F/u 3 months      Relevant Medications   lisinopril (PRINIVIL,ZESTRIL) 20 MG tablet     Musculoskeletal and Integument   Right axillary hidradenitis    Small papules seen on left side of groin  today. Rx for bactroban. Advised warm compresses. If no improvement, will let me know.       Relevant Medications   mupirocin cream (BACTROBAN) 2 %     Other   Routine general medical examination at a health care facility - Primary    Clinical breast exam performed.  Pelvic exam performed to evaluate with her cervix is present.  Unable to appreciate cervix during exam today.  Pending Pap and asked to look for any cervical cells.  No cervical cells seen on exam today, patient I discussed would be appropriate to defer Pap smears in the future.        Relevant Orders   CT CHEST LUNG CANCER SCREENING LOW DOSE WO CONTRAST   Cytology - PAP   Vitamin D deficiency    Low per recent labs; sent in new rx for vitamin d       Relevant Medications   ergocalciferol (VITAMIN D2) 50000 units capsule   HLD (hyperlipidemia)    Discussed with patient her very elevated risk for MI, stroke today.  Greater than 14%.  Patient politely declines starting a statin today.  She would like to try lifestyle modification first.  We will recheck cholesterol and discuss at follow-up in 3 months.      Relevant Medications   lisinopril (PRINIVIL,ZESTRIL) 20 MG tablet   Smoking     CT scan due to smoking ordered.  Patient declines starting nicotine patches today.  Discussed potential of Wellbutrin however patient would need to limit alcohol.  Currently drinking 4-5 beers a day ; for lower threshold for seizure.  Patient understood this and we will continue to discuss at future visits.        Other Visit Diagnoses    Encounter for screening for malignant neoplasm of respiratory organs       Relevant Orders   CT CHEST LUNG CANCER SCREENING LOW DOSE WO CONTRAST   Folliculitis       Relevant Medications   mupirocin cream (BACTROBAN) 2 %       I have discontinued Jolane Schlender's ibuprofen. I am also having her start on mupirocin cream. Additionally, I am having her maintain her ergocalciferol and lisinopril.   Meds ordered this encounter  Medications  . ergocalciferol (VITAMIN D2) 50000 units capsule    Sig: Take 1 capsule (50,000 Units total) by mouth once a week.    Dispense:  12 capsule    Refill:  0    Order Specific Question:   Supervising Provider    Answer:   Deborra Medina L [[id number]]  . lisinopril (PRINIVIL,ZESTRIL) 20 MG tablet    Sig: Take 1 tablet (20 mg total) by mouth daily.    Dispense:  90 tablet    Refill:  3    Order Specific Question:   Supervising Provider    Answer:   Deborra Medina L [[id number]]  . mupirocin cream (BACTROBAN) 2 %    Sig: Apply 1 application topically 3 (three) times daily.    Dispense:  15 g    Refill:  2    Order Specific Question:   Supervising Provider    Answer:   Crecencio Mc [[id number]]    Return precautions given.   Risks, benefits, and alternatives of the medications and treatment plan prescribed today were discussed, and patient expressed understanding.   Education regarding symptom management and diagnosis given to patient on AVS.   Continue to follow with Burnard Hawthorne, FNP for routine health maintenance.  [name] and I agreed with plan.   Mable Paris, FNP

## 2017-11-30 ENCOUNTER — Ambulatory Visit
Admission: RE | Admit: 2017-11-30 | Discharge: 2017-11-30 | Disposition: A | Discharge status: Home / Self Care | Payer: BLUE CROSS/BLUE SHIELD | Source: Ambulatory Visit | Attending: Family | Admitting: Family

## 2017-11-30 DIAGNOSIS — N6489 Other specified disorders of breast: Secondary | ICD-10-CM | POA: Diagnosis not present

## 2017-12-02 ENCOUNTER — Telehealth: Payer: Self-pay | Admitting: Family

## 2017-12-02 NOTE — Telephone Encounter (Signed)
Copied from [place] [id number]. Topic: General - Other >> Dec 02, 2017  4:06 PM Yvette Rack wrote: Reason for CRM: Pt returned call for lab results. Cb# [phone]

## 2017-12-03 NOTE — Telephone Encounter (Signed)
Left message to call back- results in result notes. 

## 2017-12-04 ENCOUNTER — Other Ambulatory Visit (HOSPITAL_COMMUNITY)
Admission: RE | Admit: 2017-12-04 | Discharge: 2017-12-04 | Disposition: A | Discharge status: Home / Self Care | Payer: BLUE CROSS/BLUE SHIELD | Source: Ambulatory Visit | Attending: Family | Admitting: Family

## 2017-12-04 ENCOUNTER — Ambulatory Visit (INDEPENDENT_AMBULATORY_CARE_PROVIDER_SITE_OTHER): Payer: BLUE CROSS/BLUE SHIELD | Admitting: Family

## 2017-12-04 ENCOUNTER — Encounter: Payer: Self-pay | Admitting: Family

## 2017-12-04 VITALS — BP 138/82 | HR 81 | Temp 97.5°F | Resp 16 | Wt 172.5 lb

## 2017-12-04 DIAGNOSIS — F17200 Nicotine dependence, unspecified, uncomplicated: Secondary | ICD-10-CM

## 2017-12-04 DIAGNOSIS — L732 Hidradenitis suppurativa: Secondary | ICD-10-CM

## 2017-12-04 DIAGNOSIS — E559 Vitamin D deficiency, unspecified: Secondary | ICD-10-CM

## 2017-12-04 DIAGNOSIS — R739 Hyperglycemia, unspecified: Secondary | ICD-10-CM | POA: Diagnosis not present

## 2017-12-04 DIAGNOSIS — I10 Essential (primary) hypertension: Secondary | ICD-10-CM

## 2017-12-04 DIAGNOSIS — Z122 Encounter for screening for malignant neoplasm of respiratory organs: Secondary | ICD-10-CM | POA: Diagnosis not present

## 2017-12-04 DIAGNOSIS — E785 Hyperlipidemia, unspecified: Secondary | ICD-10-CM

## 2017-12-04 DIAGNOSIS — IMO0001 Reserved for inherently not codable concepts without codable children: Secondary | ICD-10-CM | POA: Insufficient documentation

## 2017-12-04 DIAGNOSIS — L739 Follicular disorder, unspecified: Secondary | ICD-10-CM

## 2017-12-04 DIAGNOSIS — B182 Chronic viral hepatitis C: Secondary | ICD-10-CM

## 2017-12-04 DIAGNOSIS — Z0000 Encounter for general adult medical examination without abnormal findings: Secondary | ICD-10-CM

## 2017-12-04 DIAGNOSIS — E119 Type 2 diabetes mellitus without complications: Secondary | ICD-10-CM

## 2017-12-04 MED ORDER — MUPIROCIN CALCIUM 2 % EX CREA: 1.0000 "application " | TOPICAL_CREAM | Freq: Three times a day (TID) | CUTANEOUS | 2 refills | Status: DC

## 2017-12-04 MED ORDER — LISINOPRIL 20 MG PO TABS: 20.0000 mg | ORAL_TABLET | Freq: Every day | ORAL | 3 refills | Status: DC

## 2017-12-04 MED ORDER — ERGOCALCIFEROL 1.25 MG (50000 UT) PO CAPS: 50000.0000 [IU] | ORAL_CAPSULE | ORAL | 0 refills | Status: DC

## 2017-12-04 NOTE — Patient Instructions (Addendum)
Restart vitamin D  Limit alcohol   Monitor blood pressure,  Goal is less than 130/80; if persistently higher, please make sooner follow up appointment so we can recheck you blood pressure and manage medications  We will focus on lifestyle for blood sugar and cholesterol.   Use bactroban for folliculitis. Dial soap in NON GYN areas as discussed.    Health Maintenance for Postmenopausal Women Menopause is a normal process in which your reproductive ability comes to an end. This process happens gradually over a span of months to years, usually between the ages of 67 and 73. Menopause is complete when you have missed 12 consecutive menstrual periods. It is important to talk with your health care provider about some of the most common conditions that affect postmenopausal women, such as heart disease, cancer, and bone loss (osteoporosis). Adopting a healthy lifestyle and getting preventive care can help to promote your health and wellness. Those actions can also lower your chances of developing some of these common conditions. What should I know about menopause? During menopause, you may experience a number of symptoms, such as:  Moderate-to-severe hot flashes.  Night sweats.  Decrease in sex drive.  Mood swings.  Headaches.  Tiredness.  Irritability.  Memory problems.  Insomnia.  Choosing to treat or not to treat menopausal changes is an individual decision that you make with your health care provider. What should I know about hormone replacement therapy and supplements? Hormone therapy products are effective for treating symptoms that are associated with menopause, such as hot flashes and night sweats. Hormone replacement carries certain risks, especially as you become older. If you are thinking about using estrogen or estrogen with progestin treatments, discuss the benefits and risks with your health care provider. What should I know about heart disease and stroke? Heart disease,  heart attack, and stroke become more likely as you age. This may be due, in part, to the hormonal changes that your body experiences during menopause. These can affect how your body processes dietary fats, triglycerides, and cholesterol. Heart attack and stroke are both medical emergencies. There are many things that you can do to help prevent heart disease and stroke:  Have your blood pressure checked at least every 1-2 years. High blood pressure causes heart disease and increases the risk of stroke.  If you are 55-31 years old, ask your health care provider if you should take aspirin to prevent a heart attack or a stroke.  Do not use any tobacco products, including cigarettes, chewing tobacco, or electronic cigarettes. If you need help quitting, ask your health care provider.  It is important to eat a healthy diet and maintain a healthy weight. ? Be sure to include plenty of vegetables, fruits, low-fat dairy products, and lean protein. ? Avoid eating foods that are high in solid fats, added sugars, or salt (sodium).  Get regular exercise. This is one of the most important things that you can do for your health. ? Try to exercise for at least 150 minutes each week. The type of exercise that you do should increase your heart rate and make you sweat. This is known as moderate-intensity exercise. ? Try to do strengthening exercises at least twice each week. Do these in addition to the moderate-intensity exercise.  Know your numbers.Ask your health care provider to check your cholesterol and your blood glucose. Continue to have your blood tested as directed by your health care provider.  What should I know about cancer screening? There are several  types of cancer. Take the following steps to reduce your risk and to catch any cancer development as early as possible. Breast Cancer  Practice breast self-awareness. ? This means understanding how your breasts normally appear and feel. ? It also  means doing regular breast self-exams. Let your health care provider know about any changes, no matter how small.  If you are 43 or older, have a clinician do a breast exam (clinical breast exam or CBE) every year. Depending on your age, family history, and medical history, it may be recommended that you also have a yearly breast X-ray (mammogram).  If you have a family history of breast cancer, talk with your health care provider about genetic screening.  If you are at high risk for breast cancer, talk with your health care provider about having an MRI and a mammogram every year.  Breast cancer (BRCA) gene test is recommended for women who have family members with BRCA-related cancers. Results of the assessment will determine the need for genetic counseling and BRCA1 and for BRCA2 testing. BRCA-related cancers include these types: ? Breast. This occurs in males or females. ? Ovarian. ? Tubal. This may also be called fallopian tube cancer. ? Cancer of the abdominal or pelvic lining (peritoneal cancer). ? Prostate. ? Pancreatic.  Cervical, Uterine, and Ovarian Cancer Your health care provider may recommend that you be screened regularly for cancer of the pelvic organs. These include your ovaries, uterus, and vagina. This screening involves a pelvic exam, which includes checking for microscopic changes to the surface of your cervix (Pap test).  For women ages 21-65, health care providers may recommend a pelvic exam and a Pap test every three years. For women ages 52-65, they may recommend the Pap test and pelvic exam, combined with testing for human papilloma virus (HPV), every five years. Some types of HPV increase your risk of cervical cancer. Testing for HPV may also be done on women of any age who have unclear Pap test results.  Other health care providers may not recommend any screening for nonpregnant women who are considered low risk for pelvic cancer and have no symptoms. Ask your health  care provider if a screening pelvic exam is right for you.  If you have had past treatment for cervical cancer or a condition that could lead to cancer, you need Pap tests and screening for cancer for at least 20 years after your treatment. If Pap tests have been discontinued for you, your risk factors (such as having a new sexual partner) need to be reassessed to determine if you should start having screenings again. Some women have medical problems that increase the chance of getting cervical cancer. In these cases, your health care provider may recommend that you have screening and Pap tests more often.  If you have a family history of uterine cancer or ovarian cancer, talk with your health care provider about genetic screening.  If you have vaginal bleeding after reaching menopause, tell your health care provider.  There are currently no reliable tests available to screen for ovarian cancer.  Lung Cancer Lung cancer screening is recommended for adults 64-90 years old who are at high risk for lung cancer because of a history of smoking. A yearly low-dose CT scan of the lungs is recommended if you:  Currently smoke.  Have a history of at least 30 pack-years of smoking and you currently smoke or have quit within the past 15 years. A pack-year is smoking an average of one  pack of cigarettes per day for one year.  Yearly screening should:  Continue until it has been 15 years since you quit.  Stop if you develop a health problem that would prevent you from having lung cancer treatment.  Colorectal Cancer  This type of cancer can be detected and can often be prevented.  Routine colorectal cancer screening usually begins at age 46 and continues through age 42.  If you have risk factors for colon cancer, your health care provider may recommend that you be screened at an earlier age.  If you have a family history of colorectal cancer, talk with your health care provider about genetic  screening.  Your health care provider may also recommend using home test kits to check for hidden blood in your stool.  A small camera at the end of a tube can be used to examine your colon directly (sigmoidoscopy or colonoscopy). This is done to check for the earliest forms of colorectal cancer.  Direct examination of the colon should be repeated every 5-10 years until age 59. However, if early forms of precancerous polyps or small growths are found or if you have a family history or genetic risk for colorectal cancer, you may need to be screened more often.  Skin Cancer  Check your skin from head to toe regularly.  Monitor any moles. Be sure to tell your health care provider: ? About any new moles or changes in moles, especially if there is a change in a mole's shape or color. ? If you have a mole that is larger than the size of a pencil eraser.  If any of your family members has a history of skin cancer, especially at a young age, talk with your health care provider about genetic screening.  Always use sunscreen. Apply sunscreen liberally and repeatedly throughout the day.  Whenever you are outside, protect yourself by wearing long sleeves, pants, a wide-brimmed hat, and sunglasses.  What should I know about osteoporosis? Osteoporosis is a condition in which bone destruction happens more quickly than new bone creation. After menopause, you may be at an increased risk for osteoporosis. To help prevent osteoporosis or the bone fractures that can happen because of osteoporosis, the following is recommended:  If you are 46-84 years old, get at least 1,000 mg of calcium and at least 600 mg of vitamin D per day.  If you are older than age 77 but younger than age 65, get at least 1,200 mg of calcium and at least 600 mg of vitamin D per day.  If you are older than age 33, get at least 1,200 mg of calcium and at least 800 mg of vitamin D per day.  Smoking and excessive alcohol intake  increase the risk of osteoporosis. Eat foods that are rich in calcium and vitamin D, and do weight-bearing exercises several times each week as directed by your health care provider. What should I know about how menopause affects my mental health? Depression may occur at any age, but it is more common as you become older. Common symptoms of depression include:  Low or sad mood.  Changes in sleep patterns.  Changes in appetite or eating patterns.  Feeling an overall lack of motivation or enjoyment of activities that you previously enjoyed.  Frequent crying spells.  Talk with your health care provider if you think that you are experiencing depression. What should I know about immunizations? It is important that you get and maintain your immunizations. These include:  Tetanus, diphtheria, and pertussis (Tdap) booster vaccine.  Influenza every year before the flu season begins.  Pneumonia vaccine.  Shingles vaccine.  Your health care provider may also recommend other immunizations. This information is not intended to replace advice given to you by your health care provider. Make sure you discuss any questions you have with your health care provider. Document Released: 08/01/2005 Document Revised: 12/28/2015 Document Reviewed: 03/13/2015 Elsevier Interactive Patient Education  2018 [employer]  

## 2017-12-04 NOTE — Assessment & Plan Note (Signed)
At goal. Continue current regimen. 

## 2017-12-04 NOTE — Assessment & Plan Note (Signed)
Clinical breast exam performed.  Pelvic exam performed to evaluate with her cervix is present.  Unable to appreciate cervix during exam today.  Pending Pap and asked to look for any cervical cells.  No cervical cells seen on exam today, patient I discussed would be appropriate to defer Pap smears in the future.

## 2017-12-04 NOTE — Assessment & Plan Note (Addendum)
CT scan due to smoking ordered.  Patient declines starting nicotine patches today.  Discussed potential of Wellbutrin however patient would need to limit alcohol.  Currently drinking 4-5 beers a day ; for lower threshold for seizure.  Patient understood this and we will continue to discuss at future visits.

## 2017-12-04 NOTE — Assessment & Plan Note (Addendum)
New. Declines metformin today. Pending nutrition consult. F/u 3 months

## 2017-12-04 NOTE — Assessment & Plan Note (Signed)
Low per recent labs; sent in new rx for vitamin d

## 2017-12-04 NOTE — Assessment & Plan Note (Signed)
Discussed with patient her very elevated risk for MI, stroke today.  Greater than 14%.  Patient politely declines starting a statin today.  She would like to try lifestyle modification first.  We will recheck cholesterol and discuss at follow-up in 3 months.

## 2017-12-04 NOTE — Assessment & Plan Note (Signed)
Pending re -establishment with GI.

## 2017-12-04 NOTE — Assessment & Plan Note (Signed)
Small papules seen on left side of groin today. Rx for bactroban. Advised warm compresses. If no improvement, will let me know.

## 2017-12-07 LAB — CYTOLOGY - PAP
DIAGNOSIS: NEGATIVE
HPV (WINDOPATH): NOT DETECTED

## 2017-12-07 NOTE — Progress Notes (Signed)
Dr. Clayborn Bigness has moved to [place] so he is no longer available.

## 2017-12-08 ENCOUNTER — Telehealth: Payer: Self-pay | Admitting: *Deleted

## 2017-12-08 ENCOUNTER — Encounter: Payer: Self-pay | Admitting: Gastroenterology

## 2017-12-08 NOTE — Telephone Encounter (Signed)
Received referral for low dose lung cancer screening CT scan. Message left at phone number listed in EMR for patient to call me back to facilitate scheduling scan.  

## 2017-12-25 NOTE — Progress Notes (Signed)
Appointment 12/04/17 with GI

## 2017-12-31 ENCOUNTER — Telehealth: Payer: Self-pay | Admitting: *Deleted

## 2017-12-31 NOTE — Telephone Encounter (Signed)
Received referral for low dose lung cancer screening CT scan. Message left at phone number listed in EMR for patient to call me back to facilitate scheduling scan.  

## 2018-01-25 ENCOUNTER — Encounter: Payer: Self-pay | Admitting: *Deleted

## 2018-03-03 ENCOUNTER — Ambulatory Visit: Payer: BLUE CROSS/BLUE SHIELD | Admitting: Family

## 2018-03-26 ENCOUNTER — Ambulatory Visit (INDEPENDENT_AMBULATORY_CARE_PROVIDER_SITE_OTHER): Payer: BLUE CROSS/BLUE SHIELD | Admitting: Family

## 2018-03-26 ENCOUNTER — Encounter: Payer: Self-pay | Admitting: Family

## 2018-03-26 DIAGNOSIS — E119 Type 2 diabetes mellitus without complications: Secondary | ICD-10-CM | POA: Diagnosis not present

## 2018-03-26 DIAGNOSIS — B182 Chronic viral hepatitis C: Secondary | ICD-10-CM

## 2018-03-26 DIAGNOSIS — I10 Essential (primary) hypertension: Secondary | ICD-10-CM | POA: Diagnosis not present

## 2018-03-26 DIAGNOSIS — E785 Hyperlipidemia, unspecified: Secondary | ICD-10-CM

## 2018-03-26 MED ORDER — LISINOPRIL 40 MG PO TABS: 40.0000 mg | ORAL_TABLET | Freq: Every day | ORAL | 1 refills | Status: DC

## 2018-03-26 NOTE — Assessment & Plan Note (Signed)
Pending labs. Agrees to start medication if hasnt improved with weight loss.

## 2018-03-26 NOTE — Assessment & Plan Note (Signed)
Increase lisinopril. Patient will monitor blood pressure and let me know how she is doing. She will do labs in 5-10 after increase with work. She verbalized understanding of this.

## 2018-03-26 NOTE — Assessment & Plan Note (Signed)
Advised that surveillance is important with GI. She will continue to consider this and let me know

## 2018-03-26 NOTE — Progress Notes (Signed)
Subjective:    Patient ID: Melissa Garrison, female    DOB: 1961/02/12, 57 y.o.   MRN: [id number]  CC: Melissa Garrison is a 57 y.o. female who presents today for follow up.   HPI: Has lost 10 pounds through diet and exercise. Energy has improved. Feels good. No complaints today  HTN- compliant. Checks at work in SBP 150's. Denies exertional chest pain or pressure, numbness or tingling radiating to left arm or jaw, palpitations, dizziness, frequent headaches, changes in vision, or shortness of breath.    Declines GI consult at this time.        HISTORY:  Past Medical History:  Diagnosis Date  . Allergic rhinitis 07/11/2011  . Hepatitis C    Dx: 2011 Dr Jerilee Hoh STATES SHE WENT THROUGH TREATMENT AND STATES HEP C IS NOW RESOLVED   . Hepatitis C, chronic ([place]) 07/11/2011  . HTN (hypertension)   . Hypertension 07/11/2011  . IV drug abuse ([place])    HISTORY - greater than 10 years   Past Surgical History:  Procedure Laterality Date  . BREAST BIOPSY Left 12/05/2015   stereotactic biopsy- benign  . CHOLECYSTECTOMY  2011  . HUMERUS SURGERY     due to fracture  . HYDRADENITIS EXCISION Right 03/20/2017   Procedure: EXCISION HIDRADENITIS AXILLA;  Surgeon: Robert Bellow, MD;  Location: [facility] ORS;  Service: General;  Laterality: Right;  . LAPAROSCOPIC HYSTERECTOMY     Family History  Problem Relation Age of Onset  . Hypertension Mother   . Diabetes Father   . Hypertension Brother   . Breast cancer Neg Hx   . Colon cancer Neg Hx     Allergies: Patient has no known allergies. Current Outpatient Medications on File Prior to Visit  Medication Sig Dispense Refill  . ergocalciferol (VITAMIN D2) 50000 units capsule Take 1 capsule (50,000 Units total) by mouth once a week. 12 capsule 0  . mupirocin cream (BACTROBAN) 2 % Apply 1 application topically 3 (three) times daily. 15 g 2   No current facility-administered medications on file prior to visit.     Social History    Tobacco Use  . Smoking status: Current Every Day Smoker    Packs/day: 0.50    Years: 10.00    Pack years: 5.00    Types: Cigarettes  . Smokeless tobacco: Never Used  . Tobacco comment: 4/day  Substance Use Topics  . Alcohol use: Yes    Comment: 3-4 beers per day.   . Drug use: No    Comment: h/o iv drug use years ago    Review of Systems  Constitutional: Negative for chills and fever.  Respiratory: Negative for cough.   Cardiovascular: Negative for chest pain and palpitations.  Gastrointestinal: Negative for nausea and vomiting.      Objective:    BP (!) 150/70   Pulse 74   Temp 98.8 F (37.1 C) (Oral)   Ht 5\' 2"  (1.575 m)   Wt 163 lb 4 oz (74 kg)   SpO2 98%   BMI 29.86 kg/m  BP Readings from Last 3 Encounters:  03/26/18 (!) 150/70  12/04/17 138/82  05/26/17 (!) 150/72   Wt Readings from Last 3 Encounters:  03/26/18 163 lb 4 oz (74 kg)  12/04/17 172 lb 8 oz (78.2 kg)  05/26/17 175 lb (79.4 kg)    Physical Exam  Constitutional: She appears well-developed and well-nourished.  Eyes: Conjunctivae are normal.  Cardiovascular: Normal rate, regular rhythm, normal heart sounds and normal pulses.  Pulmonary/Chest: Effort normal and breath sounds normal. She has no wheezes. She has no rhonchi. She has no rales.  Neurological: She is alert.  Skin: Skin is warm and dry.  Psychiatric: She has a normal mood and affect. Her speech is normal and behavior is normal. Thought content normal.  Vitals reviewed.      Assessment & Plan:   Problem List Items Addressed This Visit      Cardiovascular and Mediastinum   Hypertension    Increase lisinopril. Patient will monitor blood pressure and let me know how she is doing. She will do labs in 5-10 after increase with work. She verbalized understanding of this.       Relevant Medications   lisinopril (PRINIVIL,ZESTRIL) 40 MG tablet     Digestive   Hepatitis C, chronic (HCC)    Advised that surveillance is important with  GI. She will continue to consider this and let me know        Endocrine   Diabetes mellitus without complication ([place])    Pending labs. Agrees to start medication if hasnt improved with weight loss.       Relevant Medications   lisinopril (PRINIVIL,ZESTRIL) 40 MG tablet     Other   HLD (hyperlipidemia)    Pending labs. Agrees to start medication if hasnt improved with weight loss.       Relevant Medications   lisinopril (PRINIVIL,ZESTRIL) 40 MG tablet       I have changed Melissa Garrison's lisinopril. I am also having her maintain her ergocalciferol and mupirocin cream.   Meds ordered this encounter  Medications  . lisinopril (PRINIVIL,ZESTRIL) 40 MG tablet    Sig: Take 1 tablet (40 mg total) by mouth daily.    Dispense:  90 tablet    Refill:  1    Order Specific Question:   Supervising Provider    Answer:   Crecencio Mc [[id number]]    Return precautions given.   Risks, benefits, and alternatives of the medications and treatment plan prescribed today were discussed, and patient expressed understanding.   Education regarding symptom management and diagnosis given to patient on AVS.  Continue to follow with Burnard Hawthorne, FNP for routine health maintenance.   [name] and I agreed with plan.   Mable Paris, FNP

## 2018-03-26 NOTE — Patient Instructions (Addendum)
Start lisinopril 40mg  once per day. You were on 20mg  once per day  We need your labs within 5-10 days to watch you electrolytes. Please speak with Tammy regarding this.   Please continue to consider seeing gastroenterology ( GI) for follow up  Congrats on weight loss!

## 2018-03-31 ENCOUNTER — Telehealth: Payer: Self-pay | Admitting: Family

## 2018-03-31 DIAGNOSIS — I10 Essential (primary) hypertension: Secondary | ICD-10-CM

## 2018-03-31 MED ORDER — AMLODIPINE BESYLATE 5 MG PO TABS: 5.0000 mg | ORAL_TABLET | Freq: Every day | ORAL | 3 refills | Status: DC

## 2018-03-31 MED ORDER — LISINOPRIL 20 MG PO TABS: 20.0000 mg | ORAL_TABLET | Freq: Every day | ORAL | 3 refills | Status: DC

## 2018-03-31 NOTE — Telephone Encounter (Signed)
Reason for call: medication reaction of increased dose of Lisinopril  Symptoms: felt dizzy unbalanced, head  F dizzy when she got up from sitting to standing when taking increased BP medication, denies  headache, numbness in arm, no chest pain,  stopped increased dose of BP medication was stopped on Sunday , feels fine now  ,  she has went back to lower dose of Lisinopril Advise patient to check blood pressure and let us know readings  Duration Medications: Last seen for this problem: Seen by: hasn't checked BP since stopped increased dose of BP medication

## 2018-03-31 NOTE — Telephone Encounter (Signed)
See additional telephone encounter dated 03/31/18

## 2018-03-31 NOTE — Telephone Encounter (Signed)
Noted other encounter

## 2018-03-31 NOTE — Telephone Encounter (Signed)
Call pt  Please go back to lisinopril 20mg  qd I have sent in low dose amlodipine 5mg  for her to start in addition to  If may take a full week, however goal is to be more consistently < 120/80.

## 2018-03-31 NOTE — Telephone Encounter (Signed)
Copied from [place] [phone]. Topic: Quick Communication - See Telephone Encounter >> Mar 31, 2018  8:40 AM Gardiner Ramus wrote: CRM for notification. See Telephone encounter for: 03/31/18. Patient called and stated that she had her lisinopril dosage increased. Pt states that this has made her feel funny. She states that she has been feeling tingling in head and feet. Please advise [phone]

## 2018-03-31 NOTE — Telephone Encounter (Signed)
Copied from [place]. Topic: Quick Communication - See Telephone Encounter >> Mar 31, 2018 12:38 PM Melissa Garrison wrote: CRM for notification. See Telephone encounter for: 03/31/18.  Pt called in to provide PCP with her BP reading.   153/80 today -

## 2018-03-31 NOTE — Telephone Encounter (Signed)
Call pt  Ensure tingling resolved ( appears so from  Your note) Need bp readings as you stated  We could always do 30mg  QD v the 40mg .   Would she consider 30mg ?  Most important is how she feels and what readings look like

## 2018-03-31 NOTE — Telephone Encounter (Signed)
Patient advised of below and verbalized understanding. She will monitor blood pressure after 1 week of taking medication and let us know .

## 2018-04-01 NOTE — Telephone Encounter (Signed)
Patient states she went to her nurse at work today and her blood pressure reading was 136/82

## 2018-04-01 NOTE — Telephone Encounter (Signed)
FYI

## 2018-04-02 NOTE — Telephone Encounter (Signed)
noted 

## 2018-04-05 LAB — CBC AND DIFFERENTIAL
HEMATOCRIT: 36 (ref 36–46)
Hemoglobin: 11.8 — AB (ref 12.0–16.0)
Neutrophils Absolute: 262
Platelets: 262 (ref 150–399)
WBC: 6.9

## 2018-04-05 LAB — BASIC METABOLIC PANEL
BUN: 10 (ref 4–21)
Creatinine: 0.7 (ref 0.5–1.1)
POTASSIUM: 4.2 (ref 3.4–5.3)
Sodium: 142 (ref 137–147)

## 2018-04-05 LAB — HEPATIC FUNCTION PANEL
ALT: 11 (ref 7–35)
AST: 17 (ref 13–35)
BILIRUBIN, TOTAL: 0.4

## 2018-04-05 LAB — HEMOGLOBIN A1C: HEMOGLOBIN A1C: 6.5

## 2018-04-05 LAB — VITAMIN D 25 HYDROXY (VIT D DEFICIENCY, FRACTURES): VIT D 25 HYDROXY: 23.7

## 2018-04-12 ENCOUNTER — Telehealth: Payer: Self-pay | Admitting: Family

## 2018-04-12 DIAGNOSIS — E119 Type 2 diabetes mellitus without complications: Secondary | ICD-10-CM

## 2018-04-12 MED ORDER — METFORMIN HCL ER (MOD) 500 MG PO TB24
500.0000 mg | ORAL_TABLET | Freq: Every evening | ORAL | 1 refills | Status: DC
Start: 2018-04-12 — End: 2018-07-16

## 2018-04-12 NOTE — Telephone Encounter (Signed)
Call pt I rec'd labs  a1c is 6.5. She is diabetic. I have sent in metformin. Schedule f/u appt in 3 months so we can monitor a1c. Does she also want to see a nutrionist?   Vitamin D is slightly low. Please start cholecalciferol 800 units daily for next 3 to 4 months. You may find this over the counter in the drug store. Please call to make an appointment for 3 to 4 months out so we can recheck. Also, please ensure you are following a diet high in calcium -- research shows better outcomes with dietary sources including kale, yogurt, broccolii, cheese, okra, almonds- to name a few.

## 2018-04-14 ENCOUNTER — Telehealth: Payer: Self-pay | Admitting: Family

## 2018-04-14 NOTE — Telephone Encounter (Signed)
Left voice mail for patient to call back ok for PEC to speak to patient , see below message    

## 2018-04-14 NOTE — Telephone Encounter (Signed)
Result note read to patient; verbalizes understanding.  Pt states she is currently seeing a nutritionist at her workplace. Will CB to reschedule F/U appts.  Pt is questioning if she is to continue the Ergocalciferol 50000u along with the cholecalciferol  supplement. CB# H: [phone]  C: [phone] States may leave detailed message.  Telephone encounter as result note was not routed to [facility].

## 2018-04-14 NOTE — Telephone Encounter (Signed)
Call pt  She may stop the ergolecalciferol supplement, 50,000 units. That is high dose when vitamin d < 10. Her's is 13.5 so she may take  vitamin D 1000 units by mouth daily for 12 weeks. Then you may call our office for a follow up visit and we can recheck level.

## 2018-04-14 NOTE — Addendum Note (Signed)
Addended by: Burnard Hawthorne on: 04/14/2018 04:07 PM   Modules accepted: Orders

## 2018-04-15 NOTE — Telephone Encounter (Signed)
Patient advised of below and verbalized understanding.  

## 2018-04-22 NOTE — Telephone Encounter (Signed)
Left voice mail for patient to call back ok for PEC to speak to patient  See below message

## 2018-04-23 NOTE — Telephone Encounter (Signed)
Pt returned call, the pt is saying she is experiencing side effects from the metformin. She says it's making her itch, and also causing insomnia. Pt was advised of the message below.

## 2018-04-27 NOTE — Telephone Encounter (Signed)
Left voicemail for patient to call follow up with patient to follow up in regards to medication side effects.

## 2018-04-28 NOTE — Telephone Encounter (Signed)
Spoke with pt  Symptoms resolved once stop medication. Discussed with her that I looked into different drug guides, did not see insomnia, itching as typical adverse reactions.  Patient was amenable to doing another trial ; will  take medication in the morning with breakfast.  She will do this for a week or 2 and let me know if symptoms persist.

## 2018-06-28 ENCOUNTER — Telehealth: Payer: Self-pay | Admitting: Family

## 2018-06-28 NOTE — Telephone Encounter (Signed)
Call pt Your CT lung cancer screen is overdue  Please let me know if you would like me to reorder.   Best Joycelyn Schmid

## 2018-06-28 NOTE — Telephone Encounter (Signed)
Just a FYI I called patient & she declined to have this done at this time.

## 2018-07-16 ENCOUNTER — Encounter: Payer: Self-pay | Admitting: Family

## 2018-07-16 ENCOUNTER — Ambulatory Visit: Payer: BLUE CROSS/BLUE SHIELD | Admitting: Family

## 2018-07-16 DIAGNOSIS — E669 Obesity, unspecified: Secondary | ICD-10-CM | POA: Diagnosis not present

## 2018-07-16 DIAGNOSIS — I10 Essential (primary) hypertension: Secondary | ICD-10-CM | POA: Diagnosis not present

## 2018-07-16 DIAGNOSIS — E119 Type 2 diabetes mellitus without complications: Secondary | ICD-10-CM | POA: Diagnosis not present

## 2018-07-16 NOTE — Assessment & Plan Note (Signed)
New; we agreed to aim to treat with lifestyle. Close follow up

## 2018-07-16 NOTE — Assessment & Plan Note (Signed)
Counseled on diet and exercise.  She will increase her walking program.  Low-carb diet given to patient today.  Close follow-up.

## 2018-07-16 NOTE — Progress Notes (Signed)
Subjective:    Patient ID: Melissa Garrison, female    DOB: 04-13-1961, 58 y.o.   MRN: [id number]  CC: Melissa Garrison is a 58 y.o. female who presents today for follow up.   HPI: Feels well today. No concerns  No depression, anxiety. Sleeping well  Dm - did not like the metformin, trouble sleeping on medication; has seen nutritionist at work.  Walking one mile with her mother 3 days per week.  Drinks one diet soda per day.   HTN- at home, 135/75.  No CP, SOB.    HISTORY:  Past Medical History:  Diagnosis Date  . Allergic rhinitis 07/11/2011  . Hepatitis C    Dx: 2011 Dr Jerilee Hoh STATES SHE WENT THROUGH TREATMENT AND STATES HEP C IS NOW RESOLVED   . Hepatitis C, chronic ([place]) 07/11/2011  . HTN (hypertension)   . Hypertension 07/11/2011  . IV drug abuse ([place])    HISTORY - greater than 10 years   Past Surgical History:  Procedure Laterality Date  . BREAST BIOPSY Left 12/05/2015   stereotactic biopsy- benign  . CHOLECYSTECTOMY  2011  . HUMERUS SURGERY     due to fracture  . HYDRADENITIS EXCISION Right 03/20/2017   Procedure: EXCISION HIDRADENITIS AXILLA;  Surgeon: Robert Bellow, MD;  Location: [facility] ORS;  Service: General;  Laterality: Right;  . LAPAROSCOPIC HYSTERECTOMY     Family History  Problem Relation Age of Onset  . Hypertension Mother   . Diabetes Father   . Hypertension Brother   . Breast cancer Neg Hx   . Colon cancer Neg Hx     Allergies: Metformin and related Current Outpatient Medications on File Prior to Visit  Medication Sig Dispense Refill  . amLODipine (NORVASC) 5 MG tablet Take 1 tablet (5 mg total) by mouth daily. 90 tablet 3  . lisinopril (PRINIVIL,ZESTRIL) 20 MG tablet Take 1 tablet (20 mg total) by mouth daily. 90 tablet 3   No current facility-administered medications on file prior to visit.     Social History   Tobacco Use  . Smoking status: Current Every Day Smoker    Packs/day: 0.50    Years: 10.00    Pack years: 5.00   Types: Cigarettes  . Smokeless tobacco: Never Used  . Tobacco comment: 4/day  Substance Use Topics  . Alcohol use: Yes    Comment: 3-4 beers per day.   . Drug use: No    Comment: h/o iv drug use years ago    Review of Systems  Constitutional: Negative for chills and fever.  Respiratory: Negative for cough.   Cardiovascular: Negative for chest pain and palpitations.  Gastrointestinal: Negative for nausea and vomiting.      Objective:    BP 132/60   Pulse 92   Temp 98.5 F (36.9 C)   Wt 165 lb 3.2 oz (74.9 kg)   SpO2 99%   BMI 30.22 kg/m  BP Readings from Last 3 Encounters:  07/16/18 132/60  03/26/18 (!) 150/70  12/04/17 138/82   Wt Readings from Last 3 Encounters:  07/16/18 165 lb 3.2 oz (74.9 kg)  03/26/18 163 lb 4 oz (74 kg)  12/04/17 172 lb 8 oz (78.2 kg)    Physical Exam Vitals signs reviewed.  Constitutional:      Appearance: She is well-developed.  Eyes:     Conjunctiva/sclera: Conjunctivae normal.  Cardiovascular:     Rate and Rhythm: Normal rate and regular rhythm.     Pulses: Normal pulses.  Heart sounds: Normal heart sounds.  Pulmonary:     Effort: Pulmonary effort is normal.     Breath sounds: Normal breath sounds. No wheezing, rhonchi or rales.  Skin:    General: Skin is warm and dry.  Neurological:     Mental Status: She is alert.  Psychiatric:        Speech: Speech normal.        Behavior: Behavior normal.        Thought Content: Thought content normal.        Assessment & Plan:   Problem List Items Addressed This Visit      Cardiovascular and Mediastinum   Hypertension    Slightly elevated, patient will BP readings from home so we can make informed decision in regards to medications.        Endocrine   Diabetes mellitus without complication ([place])    New; we agreed to aim to treat with lifestyle. Close follow up        Other   Obesity (BMI 30-39.9)    Counseled on diet and exercise.  She will increase her walking  program.  Low-carb diet given to patient today.  Close follow-up.          I have discontinued [place] Rubi's mupirocin cream and metFORMIN. I am also having her maintain her amLODipine and lisinopril.   No orders of the defined types were placed in this encounter.   Return precautions given.   Risks, benefits, and alternatives of the medications and treatment plan prescribed today were discussed, and patient expressed understanding.   Education regarding symptom management and diagnosis given to patient on AVS.  Continue to follow with Burnard Hawthorne, FNP for routine health maintenance.   [name] and I agreed with plan.   Mable Paris, FNP

## 2018-07-16 NOTE — Patient Instructions (Addendum)
Have A1c and BMP lab done with Tammy in 6-8 weeks.   Monitor blood pressure,  Goal is less than 120/80, based on newest guidelines; if persistently higher, please make sooner follow up appointment so we can recheck you blood pressure and manage medications  This is  Dr. Lupita Dawn  example of a  "Low GI"  Diet:  It will allow you to lose 4 to 8  lbs  per month if you follow it carefully.  Your goal with exercise is a minimum of 30 minutes of aerobic exercise 5 days per week (Walking does not count once it becomes easy!)    All of the foods can be found at grocery stores and in bulk at [employer].  The Atkins protein bars and shakes are available in more varieties at [employer], [employer] and [place].     7 AM Breakfast:  Choose from the following:  Low carbohydrate Protein  Shakes (I recommend the  Premier Protein chocolate shakes,  EAS AdvantEdge "Carb Control" shakes  Or the Atkins shakes all are under 3 net carbs)     a scrambled egg/bacon/cheese burrito made with Mission's "carb balance" whole wheat tortilla  (about 10 net carbs )  Regulatory affairs officer (basically a quiche without the pastry crust) that is eaten cold and very convenient way to get your eggs.  8 carbs)  If you make your own protein shakes, avoid bananas and pineapple,  And use low carb greek yogurt or original /unsweetened almond or soy milk    Avoid cereal and bananas, oatmeal and cream of wheat and grits. They are loaded with carbohydrates!   10 AM: high protein snack:  Protein bar by Atkins (the snack size, under 200 cal, usually < 6 net carbs).    A stick of cheese:  Around 1 carb,  100 cal     Dannon Light n Fit [place] Yogurt  (80 cal, 8 carbs)  Other so called "protein bars" and Greek yogurts tend to be loaded with carbohydrates.  Remember, in food advertising, the word "energy" is synonymous for " carbohydrate."  Lunch:   A Sandwich using the bread choices listed, Can use any  Eggs,  lunchmeat, grilled  meat or canned tuna), avocado, regular mayo/mustard  and cheese.  A Salad using blue cheese, ranch,  Goddess or vinagrette,  Avoid taco shells, croutons or "confetti" and no "candied nuts" but regular nuts OK.   No pretzels, nabs  or chips.  Pickles and miniature sweet peppers are a good low carb alternative that provide a "crunch"  The bread is the only source of carbohydrate in a sandwich and  can be decreased by trying some of the attached alternatives to traditional loaf bread   Avoid "Low fat dressings, as well as [place] dressings They are loaded with sugar!   3 PM/ Mid day  Snack:  Consider  1 ounce of  almonds, walnuts, pistachios, pecans, peanuts,  Macadamia nuts or a nut medley.  Avoid "granola and granola bars "  Mixed nuts are ok in moderation as long as there are no raisins,  cranberries or dried fruit.   KIND bars are OK if you get the low glycemic index variety   Try the prosciutto/mozzarella cheese sticks by Fiorruci  In deli /backery section   High protein      6 PM  Dinner:     Meat/fowl/fish with a green salad, and either broccoli, cauliflower, green beans, spinach, brussel  sprouts or  Lima beans. DO NOT BREAD THE PROTEIN!!      There is a low carb pasta by Dreamfield's that is acceptable and tastes great: only 5 digestible carbs/serving.( All grocery stores but BJs carry it ) Several ready made meals are available low carb:   Try Michel Angelo's chicken piccata or chicken or eggplant parm over low carb pasta.(Lowes and BJs)   Marjory Lies Sanchez's "Carnitas" (pulled pork, no sauce,  0 carbs) or his beef pot roast to make a dinner burrito (at [employer]J's)  Pesto over low carb pasta (bj's sells a good quality pesto in the center refrigerated section of the deli   Try satueeing  Cheral Marker with mushroooms as a good side   [employer] Giant makes a mashed cauliflower that tastes like mashed potatoes  Whole wheat pasta is still full of digestible carbs and  Not as low in  glycemic index as Dreamfield's.   Brown rice is still rice,  So skip the rice and noodles if you eat [place] or [place] (or at least limit to 1/2 cup)  9 PM snack :   Breyer's "low carb" fudgsicle or  ice cream bar (Carb Smart line), or  Weight Watcher's ice cream bar , or another "no sugar added" ice cream;  a serving of fresh berries/cherries with whipped cream   Cheese or DANNON'S LlGHT N FIT GREEK YOGURT  8 ounces of [place] unsweetened almond/cococunut milk    Treat yourself to a parfait made with whipped cream blueberiies, walnuts and vanilla greek yogurt  Avoid bananas, pineapple, grapes  and watermelon on a regular basis because they are high in sugar.  THINK OF THEM AS DESSERT  Remember that snack Substitutions should be less than 10 NET carbs per serving and meals < 20 carbs. Remember to subtract fiber grams to get the "net carbs."  @TULLOBREADPACKAGE @

## 2018-07-16 NOTE — Assessment & Plan Note (Addendum)
Slightly elevated, patient will BP readings from home so we can make informed decision in regards to medications.

## 2018-12-09 ENCOUNTER — Other Ambulatory Visit: Payer: Self-pay

## 2018-12-09 ENCOUNTER — Telehealth: Payer: Self-pay | Admitting: Family

## 2018-12-09 DIAGNOSIS — I10 Essential (primary) hypertension: Secondary | ICD-10-CM

## 2018-12-09 MED ORDER — LISINOPRIL 20 MG PO TABS: 20.0000 mg | ORAL_TABLET | Freq: Every day | ORAL | 3 refills | Status: DC

## 2018-12-09 NOTE — Telephone Encounter (Signed)
I have sent in refill in to pharmacy.

## 2018-12-09 NOTE — Telephone Encounter (Signed)
Medication Refill - Medication:lisinopril (PRINIVIL,ZESTRIL) 20 MG tablet   Has the patient contacted their pharmacy? Yes (Agent: If no, request that the patient contact the pharmacy for the refill.) (Agent: If yes, when and what did the pharmacy advise?)Contact PCP  Preferred Pharmacy (with phone number or street name):  [facility] [address] [place] - [place] [phone] (Phone) [phone] (Fax)     Agent: Please be advised that RX refills may take up to 3 business days. We ask that you follow-up with your pharmacy.

## 2018-12-20 ENCOUNTER — Encounter: Payer: Self-pay | Admitting: Family

## 2018-12-20 ENCOUNTER — Other Ambulatory Visit: Payer: Self-pay

## 2018-12-20 ENCOUNTER — Ambulatory Visit (INDEPENDENT_AMBULATORY_CARE_PROVIDER_SITE_OTHER): Payer: BC Managed Care – PPO | Admitting: Family

## 2018-12-20 DIAGNOSIS — I10 Essential (primary) hypertension: Secondary | ICD-10-CM

## 2018-12-20 NOTE — Patient Instructions (Addendum)
Labs when you can with Tammy at work- particularly kidney function, liver function, a1c, cholesterol, vitamin d .   Blood pressure slightly above goal  Ensure you rest 10 minutes prior to taking  Take BOTH of your blood pressure medications ( lisinopril and amlodipine) in the evening or at bedtime as may bring down blood pressure.   Monitor blood pressure,  Goal is less than 120/80, based on newest guidelines; if persistently higher, please make sooner follow up appointment so we can recheck you blood pressure and manage medications  Call the office with at least 5 readings.   Stay safe!   Managing Your Hypertension Hypertension is commonly called high blood pressure. This is when the force of your blood pressing against the walls of your arteries is too strong. Arteries are blood vessels that carry blood from your heart throughout your body. Hypertension forces the heart to work harder to pump blood, and may cause the arteries to become narrow or stiff. Having untreated or uncontrolled hypertension can cause heart attack, stroke, kidney disease, and other problems. What are blood pressure readings? A blood pressure reading consists of a higher number over a lower number. Ideally, your blood pressure should be below 120/80. The first ("top") number is called the systolic pressure. It is a measure of the pressure in your arteries as your heart beats. The second ("bottom") number is called the diastolic pressure. It is a measure of the pressure in your arteries as the heart relaxes. What does my blood pressure reading mean? Blood pressure is classified into four stages. Based on your blood pressure reading, your health care provider may use the following stages to determine what type of treatment you need, if any. Systolic pressure and diastolic pressure are measured in a unit called mm Hg. Normal  Systolic pressure: below 542.  Diastolic pressure: below 80. Elevated  Systolic pressure:  706-237.  Diastolic pressure: below 80. Hypertension stage 1  Systolic pressure: 628-315.  Diastolic pressure: 17-61. Hypertension stage 2  Systolic pressure: 607 or above.  Diastolic pressure: 90 or above. What health risks are associated with hypertension? Managing your hypertension is an important responsibility. Uncontrolled hypertension can lead to:  A heart attack.  A stroke.  A weakened blood vessel (aneurysm).  Heart failure.  Kidney damage.  Eye damage.  Metabolic syndrome.  Memory and concentration problems. What changes can I make to manage my hypertension? Hypertension can be managed by making lifestyle changes and possibly by taking medicines. Your health care provider will help you make a plan to bring your blood pressure within a normal range. Eating and drinking   Eat a diet that is high in fiber and potassium, and low in salt (sodium), added sugar, and fat. An example eating plan is called the DASH (Dietary Approaches to Stop Hypertension) diet. To eat this way: ? Eat plenty of fresh fruits and vegetables. Try to fill half of your plate at each meal with fruits and vegetables. ? Eat whole grains, such as whole wheat pasta, brown rice, or whole grain bread. Fill about one quarter of your plate with whole grains. ? Eat low-fat diary products. ? Avoid fatty cuts of meat, processed or cured meats, and poultry with skin. Fill about one quarter of your plate with lean proteins such as fish, chicken without skin, beans, eggs, and tofu. ? Avoid premade and processed foods. These tend to be higher in sodium, added sugar, and fat.  Reduce your daily sodium intake. Most people with hypertension  should eat less than 1,500 mg of sodium a day.  Limit alcohol intake to no more than 1 drink a day for nonpregnant women and 2 drinks a day for men. One drink equals 12 oz of beer, 5 oz of wine, or 1 oz of hard liquor. Lifestyle  Work with your health care provider to  maintain a healthy body weight, or to lose weight. Ask what an ideal weight is for you.  Get at least 30 minutes of exercise that causes your heart to beat faster (aerobic exercise) most days of the week. Activities may include walking, swimming, or biking.  Include exercise to strengthen your muscles (resistance exercise), such as weight lifting, as part of your weekly exercise routine. Try to do these types of exercises for 30 minutes at least 3 days a week.  Do not use any products that contain nicotine or tobacco, such as cigarettes and e-cigarettes. If you need help quitting, ask your health care provider.  Control any long-term (chronic) conditions you have, such as high cholesterol or diabetes. Monitoring  Monitor your blood pressure at home as told by your health care provider. Your personal target blood pressure may vary depending on your medical conditions, your age, and other factors.  Have your blood pressure checked regularly, as often as told by your health care provider. Working with your health care provider  Review all the medicines you take with your health care provider because there may be side effects or interactions.  Talk with your health care provider about your diet, exercise habits, and other lifestyle factors that may be contributing to hypertension.  Visit your health care provider regularly. Your health care provider can help you create and adjust your plan for managing hypertension. Will I need medicine to control my blood pressure? Your health care provider may prescribe medicine if lifestyle changes are not enough to get your blood pressure under control, and if:  Your systolic blood pressure is 130 or higher.  Your diastolic blood pressure is 80 or higher. Take medicines only as told by your health care provider. Follow the directions carefully. Blood pressure medicines must be taken as prescribed. The medicine does not work as well when you skip doses.  Skipping doses also puts you at risk for problems. Contact a health care provider if:  You think you are having a reaction to medicines you have taken.  You have repeated (recurrent) headaches.  You feel dizzy.  You have swelling in your ankles.  You have trouble with your vision. Get help right away if:  You develop a severe headache or confusion.  You have unusual weakness or numbness, or you feel faint.  You have severe pain in your chest or abdomen.  You vomit repeatedly.  You have trouble breathing. Summary  Hypertension is when the force of blood pumping through your arteries is too strong. If this condition is not controlled, it may put you at risk for serious complications.  Your personal target blood pressure may vary depending on your medical conditions, your age, and other factors. For most people, a normal blood pressure is less than 120/80.  Hypertension is managed by lifestyle changes, medicines, or both. Lifestyle changes include weight loss, eating a healthy, low-sodium diet, exercising more, and limiting alcohol. This information is not intended to replace advice given to you by your health care provider. Make sure you discuss any questions you have with your health care provider. Document Released: 03/03/2012 Document Revised: 10/01/2018 Document Reviewed: 05/07/2016  Elsevier Patient Education  [employer].

## 2018-12-20 NOTE — Progress Notes (Signed)
Verbal consent for services obtained from patient prior to services given.  Location of call:  provider at work patient at work  Names of all persons present for services: Mable Paris, NP Chief complaint:   Feels well . No complaints. At work.   HTN- at home 140-130/70. Takes amlodipine , lisinopril in morning.   Denies exertional chest pain or pressure, numbness or tingling radiating to left arm or jaw, palpitations, dizziness, frequent headaches, changes in vision, or shortness of breath.    Working on weight loss, has lost about 10 lbs. Eating healthier.   H/o of DM.  Smoker.  Mammogram due. Patient declines mammogram order as states Occupation Health at work ( tammy) will order  A/P/next steps: Problem List Items Addressed This Visit      Cardiovascular and Mediastinum   Hypertension    Slightly elevated. Discussed resting 10 mins prior to taking, trial of taking blood pressure medications at night, low salt, continue weight loss.  She will call with BP readings from home so we can adjust medications if needed.           I spent 15 min  discussing plan of care over the phone.

## 2018-12-20 NOTE — Assessment & Plan Note (Signed)
Slightly elevated. Discussed resting 10 mins prior to taking, trial of taking blood pressure medications at night, low salt, continue weight loss.  She will call with BP readings from home so we can adjust medications if needed.

## 2018-12-22 NOTE — Progress Notes (Signed)
Printed and mailed

## 2019-01-14 ENCOUNTER — Encounter: Payer: Self-pay | Admitting: Family

## 2019-01-24 ENCOUNTER — Telehealth: Payer: Self-pay | Admitting: Family

## 2019-01-24 ENCOUNTER — Other Ambulatory Visit: Payer: Self-pay

## 2019-01-24 ENCOUNTER — Other Ambulatory Visit: Payer: Self-pay | Admitting: Family

## 2019-01-24 DIAGNOSIS — Z1231 Encounter for screening mammogram for malignant neoplasm of breast: Secondary | ICD-10-CM

## 2019-01-24 NOTE — Telephone Encounter (Signed)
Call pt rev'ed labs  LDL not at goal < 70 ( hers 87). I do not see statin therapy on her chart. If agreeable, please send in crestor 10mg  and order CMP for 6 weeks after to check liver enzymes.   a1c 7, we would like 6.5%. Would she like to have nutrition referral or trial of metformin again?   If she is agreeable, you may send in metformin 500mg  PO qhs and see if she feels better on medication this time.    Your vitamin D level is low.  You may add over the counter vitamin D 1000 units by mouth daily for 12 weeks. Then you may call our office for a follow up visit and we can recheck level.

## 2019-01-24 NOTE — Telephone Encounter (Signed)
LMTCB

## 2019-01-24 NOTE — Progress Notes (Signed)
Subjective:    Patient ID: Melissa Garrison, female    DOB: 1960/11/05, 58 y.o.   MRN: [id number]  CC: Melissa Garrison is a 58 y.o. female who presents today for follow up.   HPI: Here to discuss DM. Feels well. No complaints.   DM- improved diet and lost weight on her own.   HLD- Prefers dietary changes  HTN- compliant with medications. Denies exertional chest pain or pressure, numbness or tingling radiating to left arm or jaw, palpitations, dizziness, frequent headaches, changes in vision, or shortness of breath.       HISTORY:  Past Medical History:  Diagnosis Date  . Allergic rhinitis 07/11/2011  . Hepatitis C    Dx: 2011 Dr Jerilee Hoh STATES SHE WENT THROUGH TREATMENT AND STATES HEP C IS NOW RESOLVED   . Hepatitis C, chronic ([place]) 07/11/2011  . HTN (hypertension)   . Hypertension 07/11/2011  . IV drug abuse ([place])    HISTORY - greater than 10 years   Past Surgical History:  Procedure Laterality Date  . BREAST BIOPSY Left 12/05/2015   stereotactic biopsy- benign  . CHOLECYSTECTOMY  2011  . HUMERUS SURGERY     due to fracture  . HYDRADENITIS EXCISION Right 03/20/2017   Procedure: EXCISION HIDRADENITIS AXILLA;  Surgeon: Robert Bellow, MD;  Location: [facility] ORS;  Service: General;  Laterality: Right;  . LAPAROSCOPIC HYSTERECTOMY     Family History  Problem Relation Age of Onset  . Hypertension Mother   . Diabetes Father   . Hypertension Brother   . Breast cancer Neg Hx   . Colon cancer Neg Hx     Allergies: Metformin and related Current Outpatient Medications on File Prior to Visit  Medication Sig Dispense Refill  . amLODipine (NORVASC) 5 MG tablet Take 1 tablet (5 mg total) by mouth daily. 90 tablet 3  . lisinopril (ZESTRIL) 20 MG tablet Take 1 tablet (20 mg total) by mouth daily. 90 tablet 3   No current facility-administered medications on file prior to visit.     Social History   Tobacco Use  . Smoking status: Current Every Day Smoker    Packs/day:  0.50    Years: 10.00    Pack years: 5.00    Types: Cigarettes  . Smokeless tobacco: Never Used  . Tobacco comment: 4/day  Substance Use Topics  . Alcohol use: Yes    Comment: 3-4 beers per day.   . Drug use: No    Comment: h/o iv drug use years ago    Review of Systems  Constitutional: Negative for chills and fever.  Respiratory: Negative for cough.   Cardiovascular: Negative for chest pain and palpitations.  Gastrointestinal: Negative for nausea and vomiting.  Endocrine: Negative for polydipsia, polyphagia and polyuria.      Objective:    BP 118/70   Pulse 79   Temp (!) 97.5 F (36.4 C) (Temporal)   Wt 166 lb 6.4 oz (75.5 kg)   SpO2 99%   BMI 30.43 kg/m  BP Readings from Last 3 Encounters:  02/02/19 118/70  07/16/18 132/60  03/26/18 (!) 150/70   Wt Readings from Last 3 Encounters:  02/02/19 166 lb 6.4 oz (75.5 kg)  07/16/18 165 lb 3.2 oz (74.9 kg)  03/26/18 163 lb 4 oz (74 kg)    Physical Exam Vitals signs reviewed.  Constitutional:      Appearance: She is well-developed.  Eyes:     Conjunctiva/sclera: Conjunctivae normal.  Cardiovascular:     Rate and  Rhythm: Normal rate and regular rhythm.     Pulses: Normal pulses.     Heart sounds: Normal heart sounds.  Pulmonary:     Effort: Pulmonary effort is normal.     Breath sounds: Normal breath sounds. No wheezing, rhonchi or rales.  Skin:    General: Skin is warm and dry.  Neurological:     Mental Status: She is alert.  Psychiatric:        Speech: Speech normal.        Behavior: Behavior normal.        Thought Content: Thought content normal.        Assessment & Plan:   Problem List Items Addressed This Visit      Cardiovascular and Mediastinum   Hypertension    At goal, continue current regimen        Endocrine   Diabetes mellitus without complication ([place]) - Primary    We agreed that with lifestyle changes, currentweight loss, she may be to reach goal of less than a1c  6.5 without  medication.  Close follow-up      Relevant Orders   Referral to Nutrition and Diabetes Services     Other   HLD (hyperlipidemia)    She declines medication therapy as of yet, she would like to pursue lifestyle changes.  We will revisit A1c, lipid panel later in the year          Garrison am having Melissa Garrison maintain her amLODipine and lisinopril.   No orders of the defined types were placed in this encounter.   Return precautions given.   Risks, benefits, and alternatives of the medications and treatment plan prescribed today were discussed, and patient expressed understanding.   Education regarding symptom management and diagnosis given to patient on AVS.  Continue to follow with Burnard Hawthorne, FNP for routine health maintenance.   Melissa Garrison agreed with plan.   Mable Paris, FNP

## 2019-01-24 NOTE — Telephone Encounter (Signed)
I spoke with patient & advised on below. She wanted to discuss possibly starting something like trulicity as opposed to metformin. I have made patient an in person visit to discuss. She also wants to discuss just trying to lower cholesterol with diet. Patient has no COVID exposure or symptoms. Patient told to wear mask.

## 2019-01-24 NOTE — Telephone Encounter (Signed)
Pt returning call. Please call back.

## 2019-02-02 ENCOUNTER — Ambulatory Visit: Payer: BC Managed Care – PPO | Admitting: Family

## 2019-02-02 ENCOUNTER — Encounter: Payer: Self-pay | Admitting: Family

## 2019-02-02 ENCOUNTER — Other Ambulatory Visit: Payer: Self-pay

## 2019-02-02 VITALS — BP 118/70 | HR 79 | Temp 97.5°F | Wt 166.4 lb

## 2019-02-02 DIAGNOSIS — E119 Type 2 diabetes mellitus without complications: Secondary | ICD-10-CM

## 2019-02-02 DIAGNOSIS — E785 Hyperlipidemia, unspecified: Secondary | ICD-10-CM | POA: Diagnosis not present

## 2019-02-02 DIAGNOSIS — I10 Essential (primary) hypertension: Secondary | ICD-10-CM

## 2019-02-02 NOTE — Patient Instructions (Addendum)
We will purse diet changes- you can do it!  Have your a1c, cholesterol drawn at work end of this year/ beginning of new year.  Eye exam  Today we discussed referrals, orders. nutrition   I have placed these orders in the system for you.  Please be sure to give [place] a call if you have not heard from our office regarding this. We should hear from [place] within ONE week with information regarding your appointment. If not, please let me know immediately.    Stay safe!

## 2019-02-04 NOTE — Assessment & Plan Note (Signed)
We agreed that with lifestyle changes, currentweight loss, she may be to reach goal of less than a1c  6.5 without medication.  Close follow-up

## 2019-02-04 NOTE — Assessment & Plan Note (Signed)
She declines medication therapy as of yet, she would like to pursue lifestyle changes.  We will revisit A1c, lipid panel later in the year

## 2019-02-04 NOTE — Assessment & Plan Note (Signed)
At goal, continue current regimen 

## 2019-02-24 ENCOUNTER — Telehealth: Payer: Self-pay

## 2019-02-24 NOTE — Telephone Encounter (Signed)
I called patient left detailed message. I asked that she call back so we possibly could get her in for appointment with Joycelyn Schmid or Lauren depending on screening.

## 2019-02-24 NOTE — Telephone Encounter (Signed)
Copied from [place] [id number]. Topic: General - Inquiry >> Feb 24, 2019  3:42 PM Richardo Priest, NT wrote: Reason for CRM: Patient called in stating she would like to have eyedrops called in for her right eye that seems to be running and have redness. Please advise.

## 2019-02-24 NOTE — Telephone Encounter (Signed)
Called patient to triage pt and scheduled appt.  No answer.  LMTCB.

## 2019-02-25 NOTE — Telephone Encounter (Signed)
LM again to call back to scheduled appointment if needed.

## 2019-03-03 ENCOUNTER — Ambulatory Visit
Admission: RE | Admit: 2019-03-03 | Discharge: 2019-03-03 | Disposition: A | Discharge status: Home / Self Care | Payer: BC Managed Care – PPO | Source: Ambulatory Visit | Attending: Family | Admitting: Family

## 2019-03-03 DIAGNOSIS — Z1231 Encounter for screening mammogram for malignant neoplasm of breast: Secondary | ICD-10-CM

## 2019-03-04 ENCOUNTER — Telehealth: Payer: Self-pay | Admitting: Family

## 2019-03-04 NOTE — Telephone Encounter (Signed)
Patient is calling back for the results of her mammogram. Please advise CB- [phone]

## 2019-03-07 NOTE — Telephone Encounter (Signed)
Patient called notified of results.   

## 2019-06-27 ENCOUNTER — Encounter: Payer: Self-pay | Admitting: Family

## 2019-06-27 ENCOUNTER — Ambulatory Visit (INDEPENDENT_AMBULATORY_CARE_PROVIDER_SITE_OTHER): Payer: Self-pay | Admitting: Family

## 2019-06-27 ENCOUNTER — Other Ambulatory Visit: Payer: Self-pay

## 2019-06-27 DIAGNOSIS — E119 Type 2 diabetes mellitus without complications: Secondary | ICD-10-CM

## 2019-06-27 DIAGNOSIS — I10 Essential (primary) hypertension: Secondary | ICD-10-CM

## 2019-06-27 NOTE — Assessment & Plan Note (Addendum)
Well controlled.  Continue regimen.  Due for labs and patient will have these done at work.  Emphasized the importance of this which she verbalized understanding

## 2019-06-27 NOTE — Progress Notes (Signed)
Verbal consent for services obtained from patient prior to services given to TELEPHONE visit:   Location of call:  provider at work patient at home  Names of all persons present for services: Mable Paris, NP Chief complaint:  Follow up.  Doing well.  No complaints.   htn- compliant with medication. At home 120/70.   Dm- managing with diet. Prefers to manage cholesterol with diet as well.   Walks for exercise around block for 10 minutes a couple times per week.  No Cp, sob, palpitations.   Mammogram utd Due for eye exam.   Current smoker  A/P/next steps:  Problem List Items Addressed This Visit      Cardiovascular and Mediastinum   Hypertension    Well controlled.  Continue regimen.  Due for labs and patient will have these done at work.  Emphasized the importance of this which she verbalized understanding        Endocrine   Diabetes mellitus without complication ([place])    Pending labs. Advised eye exam.         I spent 10 min  discussing plan of care over the phone.

## 2019-06-27 NOTE — Assessment & Plan Note (Signed)
Pending labs. Advised eye exam.

## 2019-06-27 NOTE — Patient Instructions (Addendum)
Have Melissa Garrison draw your labs again like you did 12/2018.  I need especially need a1c, lipid, cmp, cbc, tsh, urine protein.  Please have her send the labs to me so I can review.   Please increase walking.   Stay safe!  High Cholesterol  High cholesterol is a condition in which the blood has high levels of a white, waxy, fat-like substance (cholesterol). The human body needs small amounts of cholesterol. The liver makes all the cholesterol that the body needs. Extra (excess) cholesterol comes from the food that we eat. Cholesterol is carried from the liver by the blood through the blood vessels. If you have high cholesterol, deposits (plaques) may build up on the walls of your blood vessels (arteries). Plaques make the arteries narrower and stiffer. Cholesterol plaques increase your risk for heart attack and stroke. Work with your health care provider to keep your cholesterol levels in a healthy range. What increases the risk? This condition is more likely to develop in people who:  Eat foods that are high in animal fat (saturated fat) or cholesterol.  Are overweight.  Are not getting enough exercise.  Have a family history of high cholesterol. What are the signs or symptoms? There are no symptoms of this condition. How is this diagnosed? This condition may be diagnosed from the results of a blood test.  If you are older than age 35, your health care provider may check your cholesterol every 4-6 years.  You may be checked more often if you already have high cholesterol or other risk factors for heart disease. The blood test for cholesterol measures:  "Bad" cholesterol (LDL cholesterol). This is the main type of cholesterol that causes heart disease. The desired level for LDL is less than 100.  "Good" cholesterol (HDL cholesterol). This type helps to protect against heart disease by cleaning the arteries and carrying the LDL away. The desired level for HDL is 60 or  higher.  Triglycerides. These are fats that the body can store or burn for energy. The desired number for triglycerides is lower than 150.  Total cholesterol. This is a measure of the total amount of cholesterol in your blood, including LDL cholesterol, HDL cholesterol, and triglycerides. A healthy number is less than 200. How is this treated? This condition is treated with diet changes, lifestyle changes, and medicines. Diet changes  This may include eating more whole grains, fruits, vegetables, nuts, and fish.  This may also include cutting back on red meat and foods that have a lot of added sugar. Lifestyle changes  Changes may include getting at least 40 minutes of aerobic exercise 3 times a week. Aerobic exercises include walking, biking, and swimming. Aerobic exercise along with a healthy diet can help you maintain a healthy weight.  Changes may also include quitting smoking. Medicines  Medicines are usually given if diet and lifestyle changes have failed to reduce your cholesterol to healthy levels.  Your health care provider may prescribe a statin medicine. Statin medicines have been shown to reduce cholesterol, which can reduce the risk of heart disease. Follow these instructions at home: Eating and drinking If told by your health care provider:  Eat chicken (without skin), fish, veal, shellfish, ground [place] breast, and round or loin cuts of red meat.  Do not eat fried foods or fatty meats, such as hot dogs and salami.  Eat plenty of fruits, such as apples.  Eat plenty of vegetables, such as broccoli, potatoes, and carrots.  Eat beans, peas, and  lentils.  Eat grains such as barley, rice, couscous, and bulgur wheat.  Eat pasta without cream sauces.  Use skim or nonfat milk, and eat low-fat or nonfat yogurt and cheeses.  Do not eat or drink whole milk, cream, ice cream, egg yolks, or hard cheeses.  Do not eat stick margarine or tub margarines that contain trans  fats (also called partially hydrogenated oils).  Do not eat saturated tropical oils, such as coconut oil and palm oil.  Do not eat cakes, cookies, crackers, or other baked goods that contain trans fats.  General instructions  Exercise as directed by your health care provider. Increase your activity level with activities such as gardening, walking, and taking the stairs.  Take over-the-counter and prescription medicines only as told by your health care provider.  Do not use any products that contain nicotine or tobacco, such as cigarettes and e-cigarettes. If you need help quitting, ask your health care provider.  Keep all follow-up visits as told by your health care provider. This is important. Contact a health care provider if:  You are struggling to maintain a healthy diet or weight.  You need help to start on an exercise program.  You need help to stop smoking. Get help right away if:  You have chest pain.  You have trouble breathing. This information is not intended to replace advice given to you by your health care provider. Make sure you discuss any questions you have with your health care provider. Document Revised: 06/12/2017 Document Reviewed: 12/08/2015 Elsevier Patient Education  [place].  Diabetes Mellitus and Nutrition, Adult When you have diabetes (diabetes mellitus), it is very important to have healthy eating habits because your blood sugar (glucose) levels are greatly affected by what you eat and drink. Eating healthy foods in the appropriate amounts, at about the same times every day, can help you:  Control your blood glucose.  Lower your risk of heart disease.  Improve your blood pressure.  Reach or maintain a healthy weight. Every person with diabetes is different, and each person has different needs for a meal plan. Your health care provider may recommend that you work with a diet and nutrition specialist (dietitian) to make a meal plan that is  best for you. Your meal plan may vary depending on factors such as:  The calories you need.  The medicines you take.  Your weight.  Your blood glucose, blood pressure, and cholesterol levels.  Your activity level.  Other health conditions you have, such as heart or kidney disease. How do carbohydrates affect me? Carbohydrates, also called carbs, affect your blood glucose level more than any other type of food. Eating carbs naturally raises the amount of glucose in your blood. Carb counting is a method for keeping track of how many carbs you eat. Counting carbs is important to keep your blood glucose at a healthy level, especially if you use insulin or take certain oral diabetes medicines. It is important to know how many carbs you can safely have in each meal. This is different for every person. Your dietitian can help you calculate how many carbs you should have at each meal and for each snack. Foods that contain carbs include:  Bread, cereal, rice, pasta, and crackers.  Potatoes and corn.  Peas, beans, and lentils.  Milk and yogurt.  Fruit and juice.  Desserts, such as cakes, cookies, ice cream, and candy. How does alcohol affect me? Alcohol can cause a sudden decrease in blood glucose (hypoglycemia),  especially if you use insulin or take certain oral diabetes medicines. Hypoglycemia can be a life-threatening condition. Symptoms of hypoglycemia (sleepiness, dizziness, and confusion) are similar to symptoms of having too much alcohol. If your health care provider says that alcohol is safe for you, follow these guidelines:  Limit alcohol intake to no more than 1 drink per day for nonpregnant women and 2 drinks per day for men. One drink equals 12 oz of beer, 5 oz of wine, or 1 oz of hard liquor.  Do not drink on an empty stomach.  Keep yourself hydrated with water, diet soda, or unsweetened iced tea.  Keep in mind that regular soda, juice, and other mixers may contain a lot of  sugar and must be counted as carbs. What are tips for following this plan?  Reading food labels  Start by checking the serving size on the "Nutrition Facts" label of packaged foods and drinks. The amount of calories, carbs, fats, and other nutrients listed on the label is based on one serving of the item. Many items contain more than one serving per package.  Check the total grams (g) of carbs in one serving. You can calculate the number of servings of carbs in one serving by dividing the total carbs by 15. For example, if a food has 30 g of total carbs, it would be equal to 2 servings of carbs.  Check the number of grams (g) of saturated and trans fats in one serving. Choose foods that have low or no amount of these fats.  Check the number of milligrams (mg) of salt (sodium) in one serving. Most people should limit total sodium intake to less than 2,300 mg per day.  Always check the nutrition information of foods labeled as "low-fat" or "nonfat". These foods may be higher in added sugar or refined carbs and should be avoided.  Talk to your dietitian to identify your daily goals for nutrients listed on the label. Shopping  Avoid buying canned, premade, or processed foods. These foods tend to be high in fat, sodium, and added sugar.  Shop around the outside edge of the grocery store. This includes fresh fruits and vegetables, bulk grains, fresh meats, and fresh dairy. Cooking  Use low-heat cooking methods, such as baking, instead of high-heat cooking methods like deep frying.  Cook using healthy oils, such as olive, canola, or sunflower oil.  Avoid cooking with butter, cream, or high-fat meats. Meal planning  Eat meals and snacks regularly, preferably at the same times every day. Avoid going long periods of time without eating.  Eat foods high in fiber, such as fresh fruits, vegetables, beans, and whole grains. Talk to your dietitian about how many servings of carbs you can eat at each  meal.  Eat 4-6 ounces (oz) of lean protein each day, such as lean meat, chicken, fish, eggs, or tofu. One oz of lean protein is equal to: ? 1 oz of meat, chicken, or fish. ? 1 egg. ?  cup of tofu.  Eat some foods each day that contain healthy fats, such as avocado, nuts, seeds, and fish. Lifestyle  Check your blood glucose regularly.  Exercise regularly as told by your health care provider. This may include: ? 150 minutes of moderate-intensity or vigorous-intensity exercise each week. This could be brisk walking, biking, or water aerobics. ? Stretching and doing strength exercises, such as yoga or weightlifting, at least 2 times a week.  Take medicines as told by your health care provider.  Do not use any products that contain nicotine or tobacco, such as cigarettes and e-cigarettes. If you need help quitting, ask your health care provider.  Work with a Social worker or diabetes educator to identify strategies to manage stress and any emotional and social challenges. Questions to ask a health care provider  Do I need to meet with a diabetes educator?  Do I need to meet with a dietitian?  What number can I call if I have questions?  When are the best times to check my blood glucose? Where to find more information:  American Diabetes Association: diabetes.org  Academy of Nutrition and Dietetics: www.eatright.[employer] of Diabetes and Digestive and Kidney Diseases (NIH): [employer] Summary  A healthy meal plan will help you control your blood glucose and maintain a healthy lifestyle.  Working with a diet and nutrition specialist (dietitian) can help you make a meal plan that is best for you.  Keep in mind that carbohydrates (carbs) and alcohol have immediate effects on your blood glucose levels. It is important to count carbs and to use alcohol carefully. This information is not intended to replace advice given to you by your health care provider. Make sure  you discuss any questions you have with your health care provider. Document Revised: 05/22/2017 Document Reviewed: 07/14/2016 Elsevier Patient Education  2020 [employer].

## 2019-06-28 NOTE — Progress Notes (Signed)
Printed and mailed

## 2019-06-29 ENCOUNTER — Ambulatory Visit: Payer: BC Managed Care – PPO | Admitting: Family

## 2019-07-12 DIAGNOSIS — Z0189 Encounter for other specified special examinations: Secondary | ICD-10-CM | POA: Diagnosis not present

## 2019-09-10 ENCOUNTER — Other Ambulatory Visit: Payer: Self-pay

## 2019-09-10 ENCOUNTER — Ambulatory Visit: Payer: Self-pay | Attending: Internal Medicine

## 2019-09-10 DIAGNOSIS — Z23 Encounter for immunization: Secondary | ICD-10-CM

## 2019-09-10 NOTE — Progress Notes (Signed)
   Covid-19 Vaccination Clinic  Name:  Melissa Garrison    MRN: [id number] DOB: 1961-03-23  09/10/2019  [name] was observed post Covid-19 immunization for 15 minutes without incident. She was provided with Vaccine Information Sheet and instruction to access the V-Safe system.   [name] was instructed to call 911 with any severe reactions post vaccine: Marland Kitchen Difficulty breathing  . Swelling of face and throat  . A fast heartbeat  . A bad rash all over body  . Dizziness and weakness   Immunizations Administered    Name Date Dose VIS Date Route   Pfizer COVID-19 Vaccine 09/10/2019 12:40 PM 0.3 mL 06/03/2019 Intramuscular   Manufacturer: [place]   Lot: [id number]   [place]: [id number]

## 2019-09-13 DIAGNOSIS — Z1152 Encounter for screening for COVID-19: Secondary | ICD-10-CM | POA: Diagnosis not present

## 2019-10-04 ENCOUNTER — Ambulatory Visit: Payer: Self-pay | Attending: Internal Medicine

## 2019-10-04 DIAGNOSIS — Z23 Encounter for immunization: Secondary | ICD-10-CM

## 2019-10-04 NOTE — Progress Notes (Signed)
   Covid-19 Vaccination Clinic  Name:  Melissa Garrison    MRN: [id number] DOB: [birth year]  10/04/2019  Melissa Garrison was observed post Covid-19 immunization for 15 minutes without incident. She was provided with Vaccine Information Sheet and instruction to access the V-Safe system.   Melissa Garrison was instructed to call 911 with any severe reactions post vaccine: Marland Kitchen Difficulty breathing  . Swelling of face and throat  . A fast heartbeat  . A bad rash all over body  . Dizziness and weakness   Immunizations Administered    Name Date Dose VIS Date Route   Pfizer COVID-19 Vaccine 10/04/2019  1:12 PM 0.3 mL 06/03/2019 Intramuscular   Manufacturer: [place]   Lot: [id number]   [place]: [id number]

## 2019-10-05 DIAGNOSIS — M19041 Primary osteoarthritis, right hand: Secondary | ICD-10-CM | POA: Diagnosis not present

## 2019-10-05 DIAGNOSIS — L03011 Cellulitis of right finger: Secondary | ICD-10-CM | POA: Diagnosis not present

## 2019-10-05 DIAGNOSIS — M7989 Other specified soft tissue disorders: Secondary | ICD-10-CM | POA: Diagnosis not present

## 2019-10-05 DIAGNOSIS — S60051A Contusion of right little finger without damage to nail, initial encounter: Secondary | ICD-10-CM | POA: Diagnosis not present

## 2019-10-10 DIAGNOSIS — L089 Local infection of the skin and subcutaneous tissue, unspecified: Secondary | ICD-10-CM | POA: Diagnosis not present

## 2019-10-10 DIAGNOSIS — M7989 Other specified soft tissue disorders: Secondary | ICD-10-CM | POA: Diagnosis not present

## 2019-10-10 DIAGNOSIS — L03011 Cellulitis of right finger: Secondary | ICD-10-CM | POA: Diagnosis not present

## 2019-10-13 DIAGNOSIS — L089 Local infection of the skin and subcutaneous tissue, unspecified: Secondary | ICD-10-CM | POA: Diagnosis not present

## 2019-10-20 DIAGNOSIS — L089 Local infection of the skin and subcutaneous tissue, unspecified: Secondary | ICD-10-CM | POA: Diagnosis not present

## 2019-10-25 DIAGNOSIS — Z0189 Encounter for other specified special examinations: Secondary | ICD-10-CM | POA: Diagnosis not present

## 2019-11-08 ENCOUNTER — Telehealth: Payer: Self-pay | Admitting: Family

## 2019-11-08 NOTE — Telephone Encounter (Signed)
LMTCB

## 2019-11-08 NOTE — Telephone Encounter (Signed)
Call patient Please wish her happy E[birth year]LY birthday!  Normal thyroid, no anemia  I am concerned about her a1c,   It is 8.1.  Goal is 6.5.  Also concerned about her LDL cholesterol.  Her LDL cholesterol is 93.  In  diabetes mellitus, we want this to be less than 70. Your vitamin D level is low.  You may add over the counter vitamin D 1000 units by mouth daily for 12 weeks. Then you may call our office for a follow up visit and we can recheck level.   Would you schedule patient for follow-up appointments we can discuss medications for cholesterol and diabetes?   Make appt 'to discuss labs, a1c cholesterol' so that I know to pull these please

## 2019-11-09 ENCOUNTER — Telehealth: Payer: Self-pay | Admitting: Family

## 2019-11-09 NOTE — Telephone Encounter (Signed)
Spoke with patient & reviewed labs. Pt is scheduled for appointment 5/28 @ 11:30.

## 2019-11-09 NOTE — Telephone Encounter (Signed)
Pt returned call

## 2019-11-09 NOTE — Telephone Encounter (Signed)
LM once again for patient to call back.  

## 2019-11-09 NOTE — Telephone Encounter (Signed)
Returned call. Spoke with patient & reviewed labs. Pt is scheduled for appointment 5/28 @ 11:30.

## 2019-11-14 NOTE — Progress Notes (Signed)
Subjective:    Patient ID: Melissa Garrison, female    DOB: January 01, 1961, 59 y.o.   MRN: [id number]  CC: Melissa Garrison is a 59 y.o. female who presents today for follow up.   HPI: Feels well today.  No new concerns.  Here to discuss labs  DM-Has changed diet since lab results , eating grilled chicken, salads. Has cut back on bread, pasta. No sodas.   HTN-compliant with amlodipine.   Taking vitamin d 1000 units per day   Repeat CBC as abnormal at work   HISTORY:  Past Medical History:  Diagnosis Date  . Allergic rhinitis 07/11/2011  . Hepatitis C    Dx: 2011 Dr Jerilee Hoh STATES SHE WENT THROUGH TREATMENT AND STATES HEP C IS NOW RESOLVED   . Hepatitis C, chronic ([place]) 07/11/2011  . HTN (hypertension)   . Hypertension 07/11/2011  . IV drug abuse ([place])    HISTORY - greater than 10 years   Past Surgical History:  Procedure Laterality Date  . BREAST BIOPSY Left 12/05/2015   stereotactic biopsy- benign  . CHOLECYSTECTOMY  2011  . HUMERUS SURGERY     due to fracture  . HYDRADENITIS EXCISION Right 03/20/2017   Procedure: EXCISION HIDRADENITIS AXILLA;  Surgeon: Robert Bellow, MD;  Location: [facility] ORS;  Service: General;  Laterality: Right;  . LAPAROSCOPIC HYSTERECTOMY     Family History  Problem Relation Age of Onset  . Hypertension Mother   . Diabetes Father   . Hypertension Brother   . Breast cancer Neg Hx   . Colon cancer Neg Hx     Allergies: Metformin and related No current outpatient medications on file prior to visit.   No current facility-administered medications on file prior to visit.    Social History   Tobacco Use  . Smoking status: Current Every Day Smoker    Packs/day: 0.50    Years: 10.00    Pack years: 5.00    Types: Cigarettes  . Smokeless tobacco: Never Used  . Tobacco comment: 4/day  Substance Use Topics  . Alcohol use: Yes    Comment: 3-4 beers per day.   . Drug use: No    Comment: h/o iv drug use years ago    Review of Systems   Constitutional: Negative for chills and fever.  Respiratory: Negative for cough.   Cardiovascular: Negative for chest pain and palpitations.  Gastrointestinal: Negative for nausea and vomiting.      Objective:    BP 120/70   Pulse 75   Temp (!) 96.9 F (36.1 C) (Temporal)   Ht 5\' 4"  (1.626 m)   Wt 164 lb 3.2 oz (74.5 kg)   SpO2 97%   BMI 28.18 kg/m  BP Readings from Last 3 Encounters:  11/18/19 120/70  06/27/19 120/70  02/02/19 118/70   Wt Readings from Last 3 Encounters:  11/18/19 164 lb 3.2 oz (74.5 kg)  06/27/19 165 lb (74.8 kg)  02/02/19 166 lb 6.4 oz (75.5 kg)    Physical Exam Vitals reviewed.  Constitutional:      Appearance: She is well-developed.  Eyes:     Conjunctiva/sclera: Conjunctivae normal.  Cardiovascular:     Rate and Rhythm: Normal rate and regular rhythm.     Pulses: Normal pulses.     Heart sounds: Normal heart sounds.  Pulmonary:     Effort: Pulmonary effort is normal.     Breath sounds: Normal breath sounds. No wheezing, rhonchi or rales.  Skin:    General: Skin  is warm and dry.  Neurological:     Mental Status: She is alert.  Psychiatric:        Speech: Speech normal.        Behavior: Behavior normal.        Thought Content: Thought content normal.        Assessment & Plan:   Problem List Items Addressed This Visit      Cardiovascular and Mediastinum   Hypertension    Stable, continue amlodipine        Endocrine   Diabetes mellitus without complication ([place]) - Primary    Start Metformin, she declined starting cholesterol medication at this time.  She would like to recheck at the next visit      Relevant Medications   metFORMIN (GLUCOPHAGE) 500 MG tablet     Other   HLD (hyperlipidemia)    Patient declines starting cholesterol medication at today's visit in setting of DM.  We will continue to discuss this.  I am pleased that she started changes to her diet will certainly monitor her cholesterol.       Other Visit  Diagnoses    Abnormal CBC       Relevant Orders   CBC with Differential/Platelet (Completed)       I am having Melissa Garrison start on metFORMIN.   Meds ordered this encounter  Medications  . metFORMIN (GLUCOPHAGE) 500 MG tablet    Sig: Take 1 tablet (500 mg total) by mouth 2 (two) times daily with a meal.    Dispense:  180 tablet    Refill:  3    Order Specific Question:   Supervising Provider    Answer:   Crecencio Mc [[id number]]    Return precautions given.   Risks, benefits, and alternatives of the medications and treatment plan prescribed today were discussed, and patient expressed understanding.   Education regarding symptom management and diagnosis given to patient on AVS.  Continue to follow with Burnard Hawthorne, FNP for routine health maintenance.   Melissa Garrison and I agreed with plan.   Mable Paris, FNP

## 2019-11-16 DIAGNOSIS — Z23 Encounter for immunization: Secondary | ICD-10-CM | POA: Diagnosis not present

## 2019-11-18 ENCOUNTER — Other Ambulatory Visit: Payer: Self-pay

## 2019-11-18 ENCOUNTER — Encounter: Payer: Self-pay | Admitting: Family

## 2019-11-18 ENCOUNTER — Other Ambulatory Visit: Payer: Self-pay | Admitting: Family

## 2019-11-18 ENCOUNTER — Ambulatory Visit (INDEPENDENT_AMBULATORY_CARE_PROVIDER_SITE_OTHER): Payer: BC Managed Care – PPO | Admitting: Family

## 2019-11-18 VITALS — BP 120/70 | HR 75 | Temp 96.9°F | Ht 64.0 in | Wt 164.2 lb

## 2019-11-18 DIAGNOSIS — E785 Hyperlipidemia, unspecified: Secondary | ICD-10-CM

## 2019-11-18 DIAGNOSIS — I10 Essential (primary) hypertension: Secondary | ICD-10-CM

## 2019-11-18 DIAGNOSIS — R7989 Other specified abnormal findings of blood chemistry: Secondary | ICD-10-CM | POA: Diagnosis not present

## 2019-11-18 DIAGNOSIS — E119 Type 2 diabetes mellitus without complications: Secondary | ICD-10-CM

## 2019-11-18 LAB — CBC WITH DIFFERENTIAL/PLATELET
Basophils Absolute: 0 10*3/uL (ref 0.0–0.1)
Basophils Relative: 0.5 % (ref 0.0–3.0)
Eosinophils Absolute: 0.1 10*3/uL (ref 0.0–0.7)
Eosinophils Relative: 1 % (ref 0.0–5.0)
HCT: 33.4 % — ABNORMAL LOW (ref 36.0–46.0)
Hemoglobin: 11.5 g/dL — ABNORMAL LOW (ref 12.0–15.0)
Lymphocytes Relative: 42.3 % (ref 12.0–46.0)
Lymphs Abs: 3.2 10*3/uL (ref 0.7–4.0)
MCHC: 34.6 g/dL (ref 30.0–36.0)
MCV: 88.3 fl (ref 78.0–100.0)
Monocytes Absolute: 0.4 10*3/uL (ref 0.1–1.0)
Monocytes Relative: 5.9 % (ref 3.0–12.0)
Neutro Abs: 3.8 10*3/uL (ref 1.4–7.7)
Neutrophils Relative %: 50.3 % (ref 43.0–77.0)
Platelets: 285 10*3/uL (ref 150.0–400.0)
RBC: 3.78 Mil/uL — ABNORMAL LOW (ref 3.87–5.11)
RDW: 14.5 % (ref 11.5–15.5)
WBC: 7.6 10*3/uL (ref 4.0–10.5)

## 2019-11-18 MED ORDER — METFORMIN HCL 500 MG PO TABS: 500.0000 mg | ORAL_TABLET | Freq: Two times a day (BID) | ORAL | 3 refills | Status: DC

## 2019-11-18 MED ORDER — AMLODIPINE BESYLATE 5 MG PO TABS: 5.0000 mg | ORAL_TABLET | Freq: Every day | ORAL | 3 refills | Status: DC

## 2019-11-18 NOTE — Patient Instructions (Addendum)
Start metformin once per day for the first week and then start taking  Twice per week as prescription states.   This is  Dr. Lupita Dawn  example of a  "Low GI"  Diet:  It will allow you to lose 4 to 8  lbs  per month if you follow it carefully.  Your goal with exercise is a minimum of 30 minutes of aerobic exercise 5 days per week (Walking does not count once it becomes easy!)    All of the foods can be found at grocery stores and in bulk at [employer].  The Atkins protein bars and shakes are available in more varieties at [employer], [employer] and [employer].     7 AM Breakfast:  Choose from the following:  Low carbohydrate Protein  Shakes (I recommend the  Premier Protein chocolate shakes,  EAS AdvantEdge "Carb Control" shakes  Or the Atkins shakes all are under 3 net carbs)     a scrambled egg/bacon/cheese burrito made with Mission's "carb balance" whole wheat tortilla  (about 10 net carbs )  Regulatory affairs officer (basically a quiche without the pastry crust) that is eaten cold and very convenient way to get your eggs.  8 carbs)  If you make your own protein shakes, avoid bananas and pineapple,  And use low carb greek yogurt or original /unsweetened almond or soy milk    Avoid cereal and bananas, oatmeal and cream of wheat and grits. They are loaded with carbohydrates!   10 AM: high protein snack:  Protein bar by Atkins (the snack size, under 200 cal, usually < 6 net carbs).    A stick of cheese:  Around 1 carb,  100 cal     Dannon Light n Fit [place] Yogurt  (80 cal, 8 carbs)  Other so called "protein bars" and Greek yogurts tend to be loaded with carbohydrates.  Remember, in food advertising, the word "energy" is synonymous for " carbohydrate."  Lunch:   A Sandwich using the bread choices listed, Can use any  Eggs,  lunchmeat, grilled meat or canned tuna), avocado, regular mayo/mustard  and cheese.  A Salad using blue cheese, ranch,  Goddess or vinagrette,  Avoid taco shells,  croutons or "confetti" and no "candied nuts" but regular nuts OK.   No pretzels, nabs  or chips.  Pickles and miniature sweet peppers are a good low carb alternative that provide a "crunch"  The bread is the only source of carbohydrate in a sandwich and  can be decreased by trying some of the attached alternatives to traditional loaf bread   Avoid "Low fat dressings, as well as [place] dressings They are loaded with sugar!   3 PM/ Mid day  Snack:  Consider  1 ounce of  almonds, walnuts, pistachios, pecans, peanuts,  Macadamia nuts or a nut medley.  Avoid "granola and granola bars "  Mixed nuts are ok in moderation as long as there are no raisins,  cranberries or dried fruit.   KIND bars are OK if you get the low glycemic index variety   Try the prosciutto/mozzarella cheese sticks by F[employer]ci  In deli /backery section   High protein      6 PM  Dinner:     Meat/fowl/fish with a green salad, and either broccoli, cauliflower, green beans, spinach, brussel sprouts or  Lima beans. DO NOT BREAD THE PROTEIN!!      There is a low carb pasta by Dreamfield's that  is acceptable and tastes great: only 5 digestible carbs/serving.( All grocery stores but BJs carry it ) Several ready made meals are available low carb:   Try Michel Angelo's chicken piccata or chicken or eggplant parm over low carb pasta.(Lowes and BJs)   Marjory Lies Sanchez's "Carnitas" (pulled pork, no sauce,  0 carbs) or his beef pot roast to make a dinner burrito (at BJ's)  Pesto over low carb pasta (bj's sells a good quality pesto in the center refrigerated section of the deli   Try satueeing  Cheral Marker with mushroooms as a good side   [employer] Giant makes a mashed cauliflower that tastes like mashed potatoes  Whole wheat pasta is still full of digestible carbs and  Not as low in glycemic index as Dreamfield's.   Brown rice is still rice,  So skip the rice and noodles if you eat [place] or [place] (or at least limit to 1/2  cup)  9 PM snack :   Breyer's "low carb" fudgsicle or  ice cream bar (Carb Smart line), or  Weight Watcher's ice cream bar , or another "no sugar added" ice cream;  a serving of fresh berries/cherries with whipped cream   Cheese or DANNON'S LlGHT N FIT GREEK YOGURT  8 ounces of [place] unsweetened almond/cococunut milk    Treat yourself to a parfait made with whipped cream blueberiies, walnuts and vanilla greek yogurt  Avoid bananas, pineapple, grapes  and watermelon on a regular basis because they are high in sugar.  THINK OF THEM AS DESSERT  Remember that snack Substitutions should be less than 10 NET carbs per serving and meals < 20 carbs. Remember to subtract fiber grams to get the "net carbs."  @TULLOBREADPACKAGE @

## 2019-11-18 NOTE — Assessment & Plan Note (Signed)
Start Metformin, she declined starting cholesterol medication at this time.  She would like to recheck at the next visit

## 2019-11-22 NOTE — Assessment & Plan Note (Signed)
Patient declines starting cholesterol medication at today's visit in setting of DM.  We will continue to discuss this.  I am pleased that she started changes to her diet will certainly monitor her cholesterol.

## 2019-11-22 NOTE — Assessment & Plan Note (Signed)
Stable, continue amlodipine.  

## 2019-11-25 ENCOUNTER — Other Ambulatory Visit: Payer: Self-pay | Admitting: Family

## 2019-11-25 ENCOUNTER — Telehealth: Payer: Self-pay

## 2019-11-25 DIAGNOSIS — D649 Anemia, unspecified: Secondary | ICD-10-CM

## 2019-11-25 NOTE — Telephone Encounter (Signed)
LMTCB for lab results.  

## 2019-12-14 DIAGNOSIS — Z23 Encounter for immunization: Secondary | ICD-10-CM | POA: Diagnosis not present

## 2019-12-20 ENCOUNTER — Other Ambulatory Visit (INDEPENDENT_AMBULATORY_CARE_PROVIDER_SITE_OTHER): Payer: BC Managed Care – PPO

## 2019-12-20 DIAGNOSIS — D649 Anemia, unspecified: Secondary | ICD-10-CM

## 2019-12-20 LAB — FECAL OCCULT BLOOD, IMMUNOCHEMICAL: Fecal Occult Bld: NEGATIVE

## 2020-01-20 ENCOUNTER — Telehealth: Payer: Self-pay

## 2020-01-20 NOTE — Telephone Encounter (Signed)
Pt needs A1C completed at next visit

## 2020-01-24 ENCOUNTER — Telehealth: Payer: Self-pay | Admitting: Family

## 2020-01-24 NOTE — Telephone Encounter (Signed)
Stated  That she would like for [place] to contact her on her cell phone [phone]

## 2020-01-25 NOTE — Telephone Encounter (Signed)
Pt called back returning your call Pt wouldn't tell me what she needed to Discuss

## 2020-01-25 NOTE — Telephone Encounter (Signed)
Pt was returning call 

## 2020-01-25 NOTE — Telephone Encounter (Signed)
Called Melissa Garrison again and the call went to voicemail after the 2nd ring. Left a message to call back again to understand what she would like to discuss

## 2020-01-25 NOTE — Telephone Encounter (Signed)
Returned patient call to ask what she would like to discuss.

## 2020-01-26 NOTE — Telephone Encounter (Signed)
Called patient home phone and spoke to her mother. Melissa Garrison's mom gave her Cell phone number, [phone], and asks [place] to call that number instead. Called Melissa Garrison's cell phone number and left a message to call back.

## 2020-01-27 NOTE — Telephone Encounter (Signed)
Pt returned your call and would like a call back on cell phone.

## 2020-01-30 NOTE — Telephone Encounter (Signed)
Pt called back and said that she didn't have anything that needed to be discussed

## 2020-01-30 NOTE — Telephone Encounter (Signed)
Left a voicemail to call back. 3rd attempt to contact her across multiple days. Sent a letter to schedule an appointment and try to discuss what is needed.

## 2020-01-31 ENCOUNTER — Telehealth: Payer: Self-pay | Admitting: Family

## 2020-01-31 NOTE — Telephone Encounter (Signed)
Pt called stated that a female had called her to go over some results she was at work and could not hear him will

## 2020-02-13 ENCOUNTER — Telehealth: Payer: Self-pay

## 2020-02-13 NOTE — Telephone Encounter (Signed)
Informed patient that she is due for lab check for DM.

## 2020-02-14 ENCOUNTER — Other Ambulatory Visit: Payer: Self-pay | Admitting: Family

## 2020-02-14 DIAGNOSIS — I10 Essential (primary) hypertension: Secondary | ICD-10-CM

## 2020-02-17 ENCOUNTER — Other Ambulatory Visit: Payer: Self-pay

## 2020-02-20 ENCOUNTER — Other Ambulatory Visit: Payer: Self-pay

## 2020-02-20 ENCOUNTER — Ambulatory Visit: Payer: BC Managed Care – PPO | Admitting: Family

## 2020-02-20 ENCOUNTER — Encounter: Payer: Self-pay | Admitting: Family

## 2020-02-20 VITALS — BP 138/78 | HR 83 | Temp 98.4°F | Ht 64.0 in | Wt 157.2 lb

## 2020-02-20 DIAGNOSIS — E119 Type 2 diabetes mellitus without complications: Secondary | ICD-10-CM | POA: Diagnosis not present

## 2020-02-20 DIAGNOSIS — I10 Essential (primary) hypertension: Secondary | ICD-10-CM

## 2020-02-20 NOTE — Assessment & Plan Note (Signed)
Pending a1c, anticipate improved.

## 2020-02-20 NOTE — Patient Instructions (Addendum)
Please dr elliott's office , gastroenterology, and confirm when you are to return for colonoscopy.  Last colonoscopy we have was dr Vira Agar 2017.   Nice to see you!

## 2020-02-20 NOTE — Assessment & Plan Note (Signed)
Elevated today which is unusual based on recent, prior readings. Patient and I agreed to keep log and she will call me if persistently elevated so we can adjust regimen.

## 2020-02-20 NOTE — Progress Notes (Signed)
Subjective:    Patient ID: Melissa Garrison, female    DOB: 12-21-1960, 59 y.o.   MRN: [id number]  CC: Melissa Garrison is a 59 y.o. female who presents today for follow up.   HPI: Feels well today  No complaints  HTN- at home consistently around120/70. Compliant with medication.   Denies exertional chest pain or pressure, numbness or tingling radiating to left arm or jaw, palpitations, dizziness, frequent headaches, changes in vision, or shortness of breath.   DM- compliant with metformin. Actively working on weight loss and has stopped eating fried foods.   negative stool cards Colonoscopy 2017 - thinks repeat is every 5 years  HISTORY:  Past Medical History:  Diagnosis Date  . Allergic rhinitis 07/11/2011  . Hepatitis C    Dx: 2011 Dr Jerilee Hoh STATES SHE WENT THROUGH TREATMENT AND STATES HEP C IS NOW RESOLVED   . Hepatitis C, chronic ([place]) 07/11/2011  . HTN (hypertension)   . Hypertension 07/11/2011  . IV drug abuse ([place])    HISTORY - greater than 10 years   Past Surgical History:  Procedure Laterality Date  . BREAST BIOPSY Left 12/05/2015   stereotactic biopsy- benign  . CHOLECYSTECTOMY  2011  . HUMERUS SURGERY     due to fracture  . HYDRADENITIS EXCISION Right 03/20/2017   Procedure: EXCISION HIDRADENITIS AXILLA;  Surgeon: Robert Bellow, MD;  Location: [facility] ORS;  Service: General;  Laterality: Right;  . LAPAROSCOPIC HYSTERECTOMY     Family History  Problem Relation Age of Onset  . Hypertension Mother   . Diabetes Father   . Hypertension Brother   . Breast cancer Neg Hx   . Colon cancer Neg Hx     Allergies: Metformin and related Current Outpatient Medications on File Prior to Visit  Medication Sig Dispense Refill  . amLODipine (NORVASC) 5 MG tablet Take 1 tablet (5 mg total) by mouth daily. 90 tablet 3  . lisinopril (ZESTRIL) 20 MG tablet Take 1 tablet by mouth once daily 90 tablet 0  . metFORMIN (GLUCOPHAGE) 500 MG tablet Take 1 tablet (500 mg total)  by mouth 2 (two) times daily with a meal. 180 tablet 3  . penicillin v potassium (VEETID) 500 MG tablet Take 500 mg by mouth 4 (four) times daily.     No current facility-administered medications on file prior to visit.    Social History   Tobacco Use  . Smoking status: Current Every Day Smoker    Packs/day: 0.50    Years: 10.00    Pack years: 5.00    Types: Cigarettes  . Smokeless tobacco: Never Used  . Tobacco comment: 4/day  Vaping Use  . Vaping Use: Never used  Substance Use Topics  . Alcohol use: Yes    Comment: 3-4 beers per day.   . Drug use: No    Comment: h/o iv drug use years ago    Review of Systems  Constitutional: Negative for chills and fever.  Respiratory: Negative for cough.   Cardiovascular: Negative for chest pain and palpitations.  Gastrointestinal: Negative for blood in stool, nausea and vomiting.      Objective:    BP 138/78   Pulse 83   Temp 98.4 F (36.9 C)   Ht 5\' 4"  (1.626 m)   Wt 157 lb 3.2 oz (71.3 kg)   SpO2 98%   BMI 26.98 kg/m  BP Readings from Last 3 Encounters:  02/20/20 138/78  11/18/19 120/70  06/27/19 120/70   Wt Readings from  Last 3 Encounters:  02/20/20 157 lb 3.2 oz (71.3 kg)  11/18/19 164 lb 3.2 oz (74.5 kg)  06/27/19 165 lb (74.8 kg)    Physical Exam Vitals reviewed.  Constitutional:      Appearance: She is well-developed.  Eyes:     Conjunctiva/sclera: Conjunctivae normal.  Cardiovascular:     Rate and Rhythm: Normal rate and regular rhythm.     Pulses: Normal pulses.     Heart sounds: Normal heart sounds.  Pulmonary:     Effort: Pulmonary effort is normal.     Breath sounds: Normal breath sounds. No wheezing, rhonchi or rales.  Skin:    General: Skin is warm and dry.  Neurological:     Mental Status: She is alert.  Psychiatric:        Speech: Speech normal.        Behavior: Behavior normal.        Thought Content: Thought content normal.        Assessment & Plan:   Problem List Items Addressed  This Visit      Cardiovascular and Mediastinum   Hypertension - Primary    Elevated today which is unusual based on recent, prior readings. Patient and I agreed to keep log and she will call me if persistently elevated so we can adjust regimen.       Relevant Orders   Lipid panel   CBC with Differential/Platelet   B12 and Folate Panel   IBC + Ferritin     Endocrine   Diabetes mellitus without complication ([place])    Pending a1c, anticipate improved.       Relevant Orders   Hemoglobin A1c       I am having Melissa Garrison maintain her metFORMIN, amLODipine, lisinopril, and penicillin v potassium.   No orders of the defined types were placed in this encounter.   Return precautions given.   Risks, benefits, and alternatives of the medications and treatment plan prescribed today were discussed, and patient expressed understanding.   Education regarding symptom management and diagnosis given to patient on AVS.  Continue to follow with Burnard Hawthorne, FNP for routine health maintenance.   Melissa Garrison and I agreed with plan.   Mable Paris, FNP

## 2020-02-28 DIAGNOSIS — M79609 Pain in unspecified limb: Secondary | ICD-10-CM | POA: Diagnosis not present

## 2020-02-29 ENCOUNTER — Other Ambulatory Visit: Payer: Self-pay

## 2020-02-29 ENCOUNTER — Ambulatory Visit
Admission: RE | Admit: 2020-02-29 | Discharge: 2020-02-29 | Disposition: A | Discharge status: Home / Self Care | Payer: BC Managed Care – PPO | Attending: Family Medicine | Admitting: Family Medicine

## 2020-02-29 ENCOUNTER — Other Ambulatory Visit: Payer: Self-pay | Admitting: Family Medicine

## 2020-02-29 ENCOUNTER — Ambulatory Visit
Admission: RE | Admit: 2020-02-29 | Discharge: 2020-02-29 | Disposition: A | Discharge status: Home / Self Care | Payer: BC Managed Care – PPO | Source: Ambulatory Visit | Attending: Family Medicine | Admitting: Family Medicine

## 2020-02-29 DIAGNOSIS — M79671 Pain in right foot: Secondary | ICD-10-CM | POA: Insufficient documentation

## 2020-02-29 DIAGNOSIS — M19071 Primary osteoarthritis, right ankle and foot: Secondary | ICD-10-CM | POA: Diagnosis not present

## 2020-02-29 DIAGNOSIS — M7731 Calcaneal spur, right foot: Secondary | ICD-10-CM | POA: Diagnosis not present

## 2020-02-29 DIAGNOSIS — R52 Pain, unspecified: Secondary | ICD-10-CM | POA: Insufficient documentation

## 2020-02-29 DIAGNOSIS — S99921A Unspecified injury of right foot, initial encounter: Secondary | ICD-10-CM | POA: Diagnosis not present

## 2020-03-01 DIAGNOSIS — M79609 Pain in unspecified limb: Secondary | ICD-10-CM | POA: Diagnosis not present

## 2020-03-09 ENCOUNTER — Other Ambulatory Visit (INDEPENDENT_AMBULATORY_CARE_PROVIDER_SITE_OTHER): Payer: BC Managed Care – PPO

## 2020-03-09 ENCOUNTER — Other Ambulatory Visit: Payer: Self-pay

## 2020-03-09 DIAGNOSIS — I10 Essential (primary) hypertension: Secondary | ICD-10-CM | POA: Diagnosis not present

## 2020-03-09 DIAGNOSIS — E119 Type 2 diabetes mellitus without complications: Secondary | ICD-10-CM

## 2020-03-09 LAB — IBC + FERRITIN
Ferritin: 222.4 ng/mL (ref 10.0–291.0)
Iron: 42 ug/dL (ref 42–145)
Saturation Ratios: 10.8 % — ABNORMAL LOW (ref 20.0–50.0)
Transferrin: 277 mg/dL (ref 212.0–360.0)

## 2020-03-09 LAB — CBC WITH DIFFERENTIAL/PLATELET
Basophils Absolute: 0 10*3/uL (ref 0.0–0.1)
Basophils Relative: 0.9 % (ref 0.0–3.0)
Eosinophils Absolute: 0.1 10*3/uL (ref 0.0–0.7)
Eosinophils Relative: 1.2 % (ref 0.0–5.0)
HCT: 35.2 % — ABNORMAL LOW (ref 36.0–46.0)
Hemoglobin: 12 g/dL (ref 12.0–15.0)
Lymphocytes Relative: 37.8 % (ref 12.0–46.0)
Lymphs Abs: 1.9 10*3/uL (ref 0.7–4.0)
MCHC: 34.1 g/dL (ref 30.0–36.0)
MCV: 90.3 fl (ref 78.0–100.0)
Monocytes Absolute: 0.5 10*3/uL (ref 0.1–1.0)
Monocytes Relative: 10.6 % (ref 3.0–12.0)
Neutro Abs: 2.5 10*3/uL (ref 1.4–7.7)
Neutrophils Relative %: 49.5 % (ref 43.0–77.0)
Platelets: 244 10*3/uL (ref 150.0–400.0)
RBC: 3.9 Mil/uL (ref 3.87–5.11)
RDW: 13.9 % (ref 11.5–15.5)
WBC: 5.1 10*3/uL (ref 4.0–10.5)

## 2020-03-09 LAB — LIPID PANEL
Cholesterol: 159 mg/dL (ref 0–200)
HDL: 45.1 mg/dL (ref >39.00)
LDL Cholesterol: 93 mg/dL (ref 0–99)
NonHDL: 113.43
Total CHOL/HDL Ratio: 4
Triglycerides: 100 mg/dL (ref 0.0–149.0)
VLDL: 20 mg/dL (ref 0.0–40.0)

## 2020-03-09 LAB — HEMOGLOBIN A1C: Hgb A1c MFr Bld: 7 % — ABNORMAL HIGH (ref 4.6–6.5)

## 2020-03-09 LAB — B12 AND FOLATE PANEL
Folate: 8 ng/mL (ref >5.9)
Vitamin B-12: 230 pg/mL (ref 211–911)

## 2020-03-12 ENCOUNTER — Telehealth: Payer: Self-pay

## 2020-03-12 NOTE — Telephone Encounter (Signed)
LMTCB for lab results.  

## 2020-03-13 ENCOUNTER — Other Ambulatory Visit: Payer: Self-pay

## 2020-03-13 DIAGNOSIS — E785 Hyperlipidemia, unspecified: Secondary | ICD-10-CM

## 2020-03-13 DIAGNOSIS — Z23 Encounter for immunization: Secondary | ICD-10-CM | POA: Diagnosis not present

## 2020-03-13 MED ORDER — ROSUVASTATIN CALCIUM 10 MG PO TABS: 10.0000 mg | ORAL_TABLET | Freq: Every day | ORAL | 0 refills | Status: DC

## 2020-03-15 ENCOUNTER — Other Ambulatory Visit: Payer: Self-pay | Admitting: Family

## 2020-03-15 DIAGNOSIS — E119 Type 2 diabetes mellitus without complications: Secondary | ICD-10-CM

## 2020-03-15 MED ORDER — METFORMIN HCL ER 500 MG PO TB24: 1000.0000 mg | ORAL_TABLET | Freq: Two times a day (BID) | ORAL | 3 refills | Status: DC

## 2020-03-15 NOTE — Progress Notes (Signed)
me

## 2020-04-13 DIAGNOSIS — Z23 Encounter for immunization: Secondary | ICD-10-CM | POA: Diagnosis not present

## 2020-04-27 ENCOUNTER — Other Ambulatory Visit: Payer: BC Managed Care – PPO

## 2020-05-04 ENCOUNTER — Other Ambulatory Visit (INDEPENDENT_AMBULATORY_CARE_PROVIDER_SITE_OTHER): Payer: BC Managed Care – PPO

## 2020-05-04 ENCOUNTER — Other Ambulatory Visit: Payer: Self-pay

## 2020-05-04 DIAGNOSIS — E785 Hyperlipidemia, unspecified: Secondary | ICD-10-CM

## 2020-05-04 LAB — COMPREHENSIVE METABOLIC PANEL
ALT: 13 U/L (ref 0–35)
AST: 19 U/L (ref 0–37)
Albumin: 4.4 g/dL (ref 3.5–5.2)
Alkaline Phosphatase: 44 U/L (ref 39–117)
BUN: 10 mg/dL (ref 6–23)
CO2: 28 mEq/L (ref 19–32)
Calcium: 9.7 mg/dL (ref 8.4–10.5)
Chloride: 102 mEq/L (ref 96–112)
Creatinine, Ser: 0.72 mg/dL (ref 0.40–1.20)
GFR: 92.77 mL/min (ref >60.00)
Glucose, Bld: 161 mg/dL — ABNORMAL HIGH (ref 70–99)
Potassium: 3.7 mEq/L (ref 3.5–5.1)
Sodium: 137 mEq/L (ref 135–145)
Total Bilirubin: 0.5 mg/dL (ref 0.2–1.2)
Total Protein: 7.9 g/dL (ref 6.0–8.3)

## 2020-05-07 ENCOUNTER — Other Ambulatory Visit: Payer: BC Managed Care – PPO

## 2020-05-08 ENCOUNTER — Telehealth: Payer: Self-pay

## 2020-05-08 NOTE — Telephone Encounter (Signed)
LMTCB for lab results.  

## 2020-05-14 ENCOUNTER — Other Ambulatory Visit: Payer: Self-pay | Admitting: Family

## 2020-05-14 DIAGNOSIS — Z1231 Encounter for screening mammogram for malignant neoplasm of breast: Secondary | ICD-10-CM

## 2020-05-19 ENCOUNTER — Other Ambulatory Visit: Payer: Self-pay | Admitting: Family

## 2020-05-19 DIAGNOSIS — I10 Essential (primary) hypertension: Secondary | ICD-10-CM

## 2020-05-23 ENCOUNTER — Ambulatory Visit: Payer: BC Managed Care – PPO | Admitting: Family

## 2020-06-01 ENCOUNTER — Telehealth: Payer: Self-pay

## 2020-06-01 ENCOUNTER — Encounter: Payer: Self-pay | Admitting: Family

## 2020-06-01 NOTE — Telephone Encounter (Signed)
Error pt will call pharmacy first.

## 2020-07-02 DIAGNOSIS — R110 Nausea: Secondary | ICD-10-CM | POA: Diagnosis not present

## 2020-07-02 DIAGNOSIS — R197 Diarrhea, unspecified: Secondary | ICD-10-CM | POA: Diagnosis not present

## 2020-07-13 ENCOUNTER — Other Ambulatory Visit: Payer: Self-pay

## 2020-07-13 ENCOUNTER — Ambulatory Visit: Payer: BC Managed Care – PPO | Admitting: Family

## 2020-07-18 ENCOUNTER — Ambulatory Visit: Payer: BC Managed Care – PPO | Admitting: Family

## 2020-07-18 ENCOUNTER — Encounter: Payer: Self-pay | Admitting: Family

## 2020-07-18 ENCOUNTER — Other Ambulatory Visit: Payer: Self-pay

## 2020-07-18 DIAGNOSIS — E785 Hyperlipidemia, unspecified: Secondary | ICD-10-CM

## 2020-07-18 DIAGNOSIS — E119 Type 2 diabetes mellitus without complications: Secondary | ICD-10-CM | POA: Diagnosis not present

## 2020-07-18 DIAGNOSIS — I10 Essential (primary) hypertension: Secondary | ICD-10-CM | POA: Diagnosis not present

## 2020-07-18 MED ORDER — METFORMIN HCL ER 500 MG PO TB24: 500.0000 mg | ORAL_TABLET | Freq: Two times a day (BID) | ORAL | 3 refills | Status: DC

## 2020-07-18 NOTE — Assessment & Plan Note (Signed)
Controlled. Compliant amlodipine 5mg , lisinopril 20mg 

## 2020-07-18 NOTE — Assessment & Plan Note (Signed)
Anticipate improved with weight loss. Continue metformin 500mg  bid

## 2020-07-18 NOTE — Assessment & Plan Note (Addendum)
Anticipate improved- she will get labs done at work. Continue crestor 10mg .

## 2020-07-18 NOTE — Patient Instructions (Addendum)
Please call  and schedule your 3D mammogram scan as discussed.   [place]  [place], [place]  Ask Tammy to order A1c and Lipid panel- please send to me.

## 2020-07-18 NOTE — Progress Notes (Signed)
Subjective:    Patient ID: Melissa Garrison, female    DOB: Nov 08, 1960, 60 y.o.   MRN: [id number]  CC: Melissa Garrison is a 59 y.o. female who presents today for follow up.   HPI: Feels well today No complaints  HLD -compliant with crestor 10mg  HTN- compliant with amlodipine 5mg , lisinopril 20mg   DM- compliant with metformin 500mg  bid   HISTORY:  Past Medical History:  Diagnosis Date  . Allergic rhinitis 07/11/2011  . Hepatitis C    Dx: 2011 Dr Jerilee Hoh STATES SHE WENT THROUGH TREATMENT AND STATES HEP C IS NOW RESOLVED   . Hepatitis C, chronic ([place]) 07/11/2011  . HTN (hypertension)   . Hypertension 07/11/2011  . IV drug abuse ([place])    HISTORY - greater than 10 years   Past Surgical History:  Procedure Laterality Date  . BREAST BIOPSY Left 12/05/2015   stereotactic biopsy- benign  . CHOLECYSTECTOMY  2011  . HUMERUS SURGERY     due to fracture  . HYDRADENITIS EXCISION Right 03/20/2017   Procedure: EXCISION HIDRADENITIS AXILLA;  Surgeon: Robert Bellow, MD;  Location: [facility] ORS;  Service: General;  Laterality: Right;  . LAPAROSCOPIC HYSTERECTOMY     Family History  Problem Relation Age of Onset  . Hypertension Mother   . Diabetes Father   . Hypertension Brother   . Breast cancer Neg Hx   . Colon cancer Neg Hx     Allergies: Metformin and related Current Outpatient Medications on File Prior to Visit  Medication Sig Dispense Refill  . amLODipine (NORVASC) 5 MG tablet Take 1 tablet (5 mg total) by mouth daily. 90 tablet 3  . lisinopril (ZESTRIL) 20 MG tablet Take 1 tablet by mouth once daily 90 tablet 0  . rosuvastatin (CRESTOR) 10 MG tablet Take 1 tablet (10 mg total) by mouth daily. 90 tablet 0   No current facility-administered medications on file prior to visit.    Social History   Tobacco Use  . Smoking status: Current Every Day Smoker    Packs/day: 0.50    Years: 10.00    Pack years: 5.00    Types: Cigarettes  . Smokeless tobacco: Never Used  .  Tobacco comment: 4/day  Vaping Use  . Vaping Use: Never used  Substance Use Topics  . Alcohol use: Yes    Comment: 3-4 beers per day.   . Drug use: No    Comment: h/o iv drug use years ago    Review of Systems  Constitutional: Negative for chills and fever.  Respiratory: Negative for cough.   Cardiovascular: Negative for chest pain and palpitations.  Gastrointestinal: Negative for nausea and vomiting.      Objective:    BP 124/72   Pulse 74   Temp 98.5 F (36.9 C)   Ht 5\' 4"  (1.626 m)   Wt 153 lb 12.8 oz (69.8 kg)   SpO2 98%   BMI 26.40 kg/m  BP Readings from Last 3 Encounters:  07/18/20 124/72  02/20/20 138/78  11/18/19 120/70   Wt Readings from Last 3 Encounters:  07/18/20 153 lb 12.8 oz (69.8 kg)  02/20/20 157 lb 3.2 oz (71.3 kg)  11/18/19 164 lb 3.2 oz (74.5 kg)    Physical Exam Vitals reviewed.  Constitutional:      Appearance: She is well-developed and well-nourished.  Eyes:     Conjunctiva/sclera: Conjunctivae normal.  Cardiovascular:     Rate and Rhythm: Normal rate and regular rhythm.     Pulses: Normal pulses.  Heart sounds: Normal heart sounds.  Pulmonary:     Effort: Pulmonary effort is normal.     Breath sounds: Normal breath sounds. No wheezing, rhonchi or rales.  Skin:    General: Skin is warm and dry.  Neurological:     Mental Status: She is alert.  Psychiatric:        Mood and Affect: Mood and affect normal.        Speech: Speech normal.        Behavior: Behavior normal.        Thought Content: Thought content normal.        Assessment & Plan:   Problem List Items Addressed This Visit      Cardiovascular and Mediastinum   Hypertension    Controlled. Compliant amlodipine 5mg , lisinopril 20mg         Endocrine   Diabetes mellitus without complication ([place])    Anticipate improved with weight loss. Continue metformin 500mg  bid      Relevant Medications   metFORMIN (GLUCOPHAGE XR) 500 MG 24 hr tablet     Other   HLD  (hyperlipidemia)    Anticipate improved- she will get labs done at work. Continue crestor 10mg .           I have discontinued [place]'s penicillin v potassium. I have also changed her metFORMIN. Additionally, I am having her maintain her amLODipine, rosuvastatin, and lisinopril.   Meds ordered this encounter  Medications  . metFORMIN (GLUCOPHAGE XR) 500 MG 24 hr tablet    Sig: Take 1 tablet (500 mg total) by mouth in the morning and at bedtime.    Dispense:  90 tablet    Refill:  3    Order Specific Question:   Supervising Provider    Answer:   Crecencio Mc [[id number]]    Return precautions given.   Risks, benefits, and alternatives of the medications and treatment plan prescribed today were discussed, and patient expressed understanding.   Education regarding symptom management and diagnosis given to patient on AVS.  Continue to follow with Burnard Hawthorne, FNP for routine health maintenance.   [name] and I agreed with plan.   Mable Paris, FNP

## 2020-08-22 DIAGNOSIS — M25512 Pain in left shoulder: Secondary | ICD-10-CM | POA: Diagnosis not present

## 2020-08-29 ENCOUNTER — Other Ambulatory Visit: Payer: Self-pay | Admitting: Family

## 2020-08-29 DIAGNOSIS — I10 Essential (primary) hypertension: Secondary | ICD-10-CM

## 2020-09-26 ENCOUNTER — Other Ambulatory Visit: Payer: Self-pay | Admitting: Sports Medicine

## 2020-09-26 DIAGNOSIS — M25512 Pain in left shoulder: Secondary | ICD-10-CM

## 2020-09-26 DIAGNOSIS — M7532 Calcific tendinitis of left shoulder: Secondary | ICD-10-CM

## 2020-09-26 DIAGNOSIS — M7552 Bursitis of left shoulder: Secondary | ICD-10-CM

## 2020-09-26 DIAGNOSIS — S4992XD Unspecified injury of left shoulder and upper arm, subsequent encounter: Secondary | ICD-10-CM

## 2020-09-29 IMAGING — MG MM DIGITAL SCREENING BILAT W/ TOMO W/ CAD
8 series · 8 of 24 positions shown · non-contrast
Comparison: Previous exam(s).

CLINICAL DATA: Screening.

EXAM:
DIGITAL SCREENING BILATERAL MAMMOGRAM WITH TOMO AND CAD

[L MLO synth-2D]
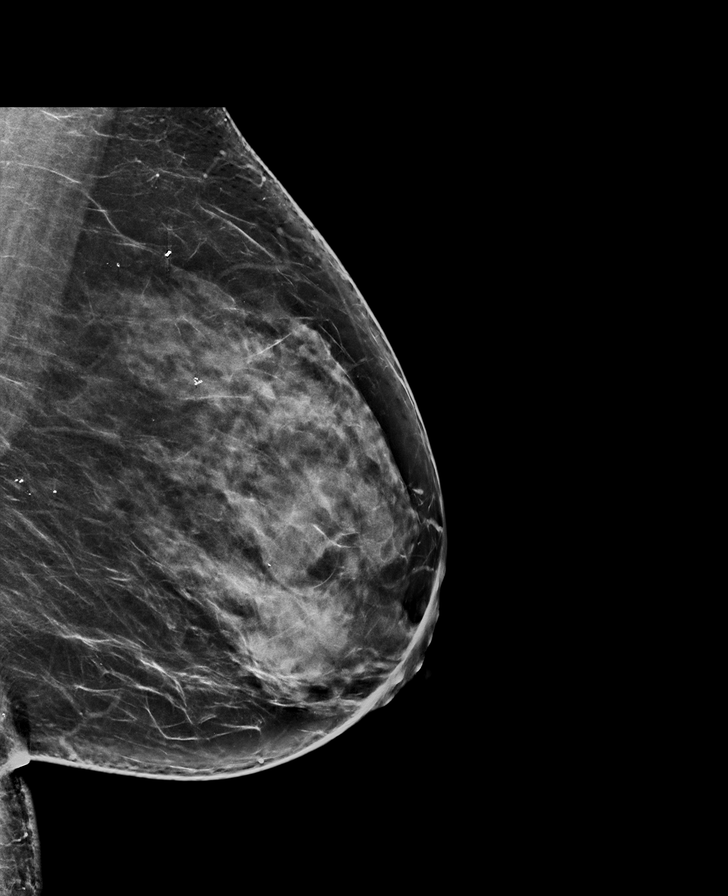

[R CC synth-2D]
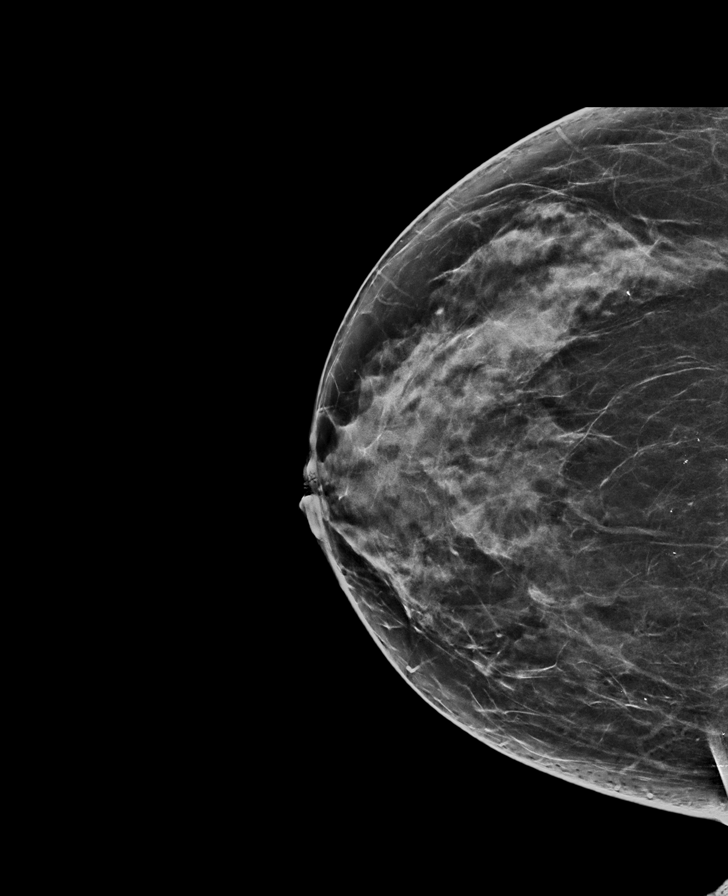

[L CC synth-2D]
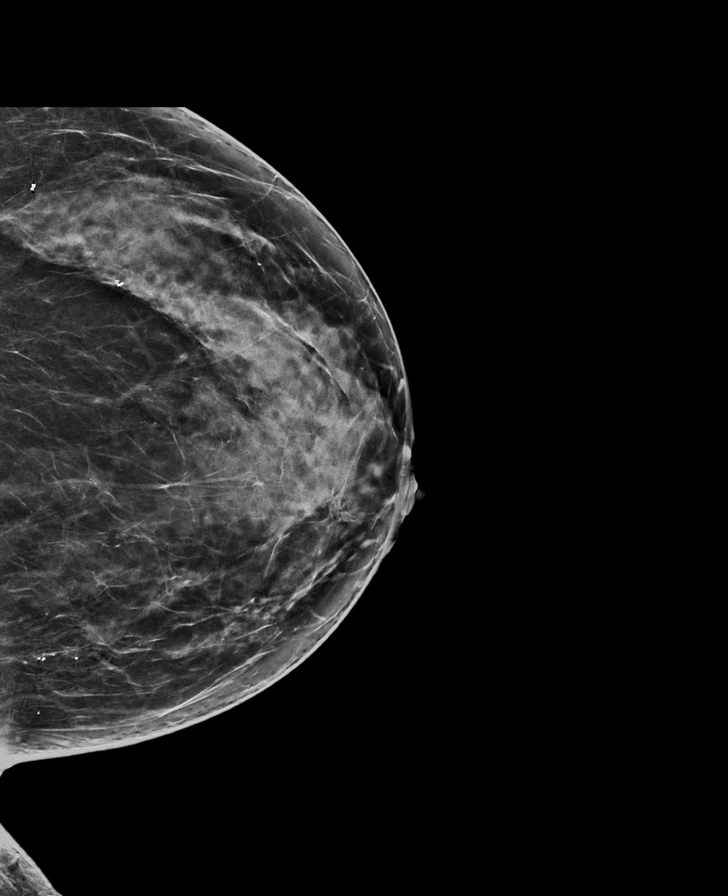

[R MLO synth-2D]
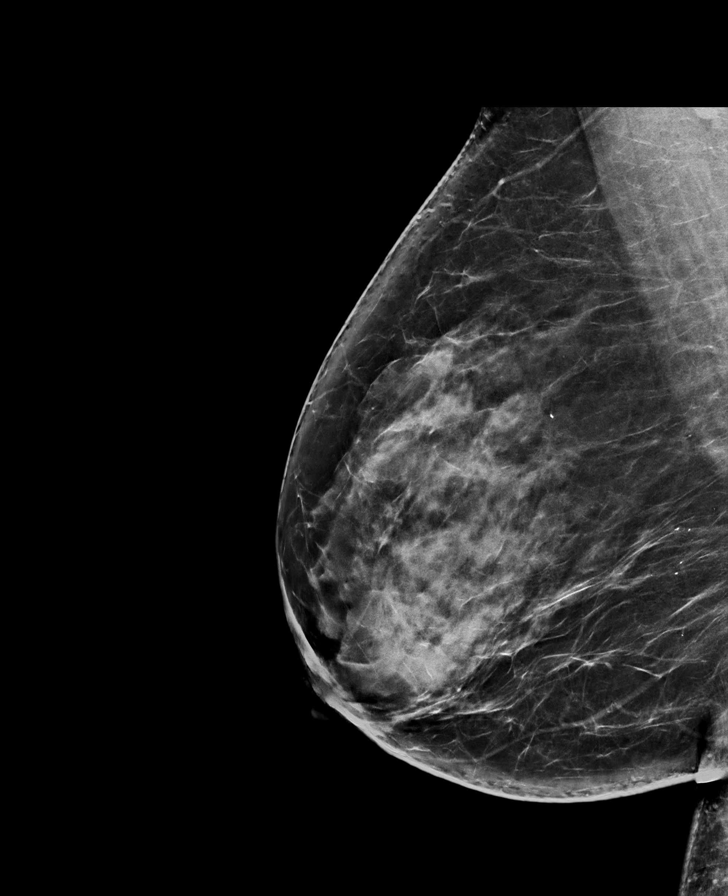

[R MLO tomo · tomo slice 43/86.0]
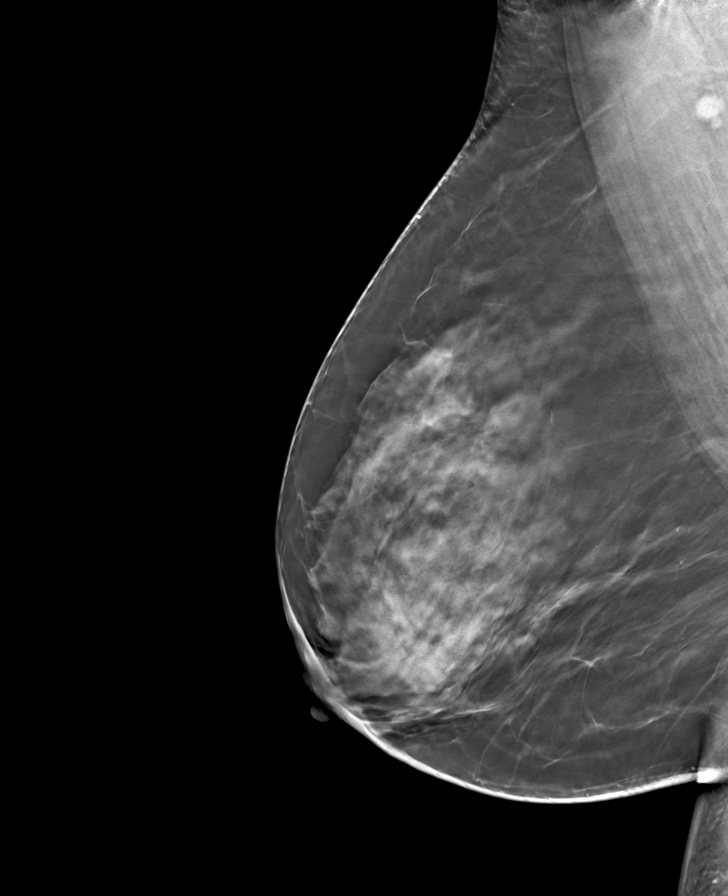

[L MLO tomo · tomo slice 42/83.0]
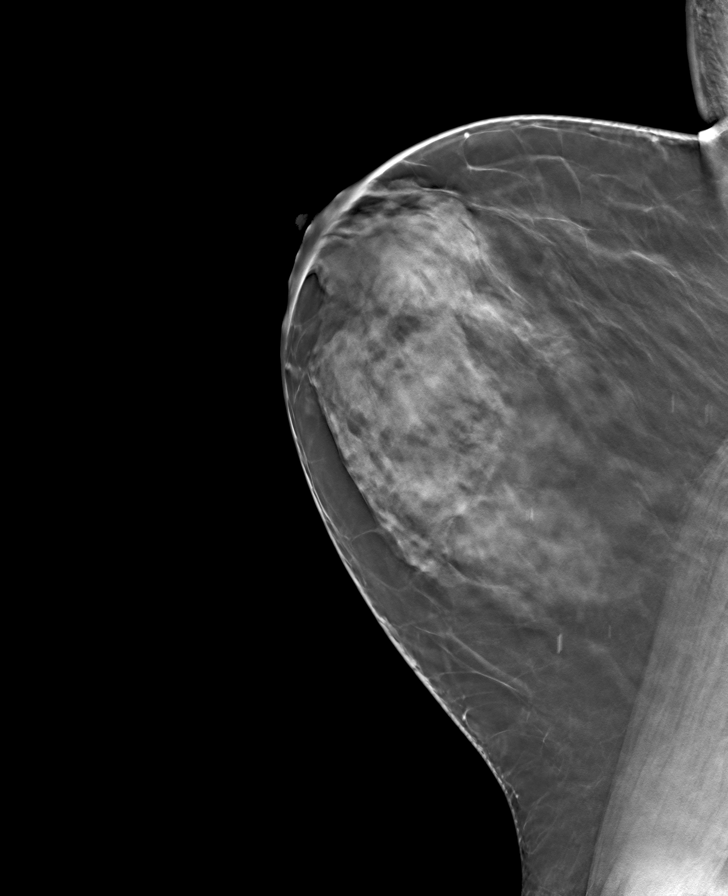

[R CC tomo · tomo slice 37/74.0]
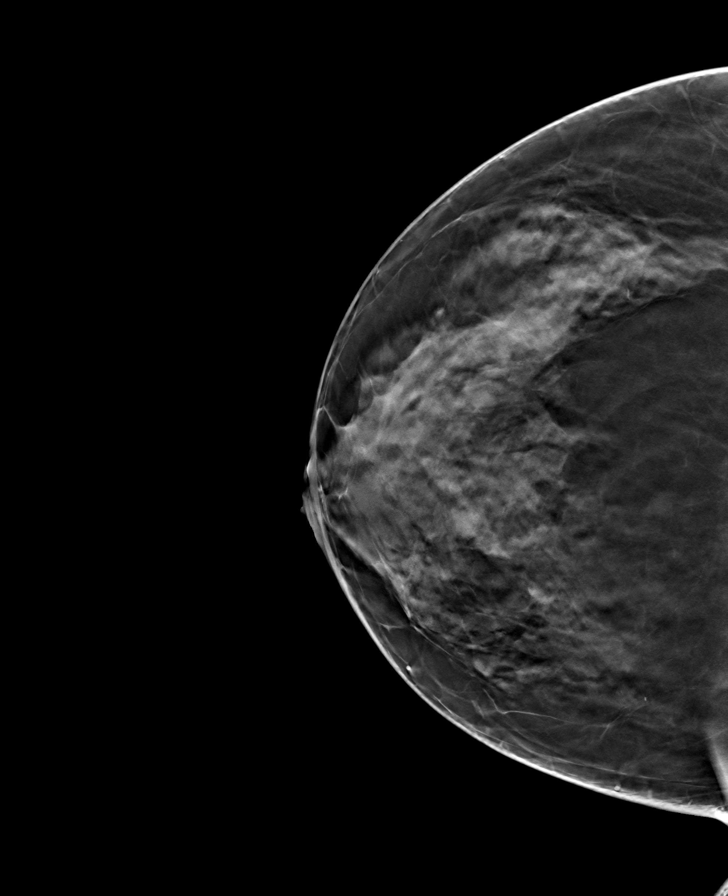

[L CC tomo · tomo slice 39/76.0]
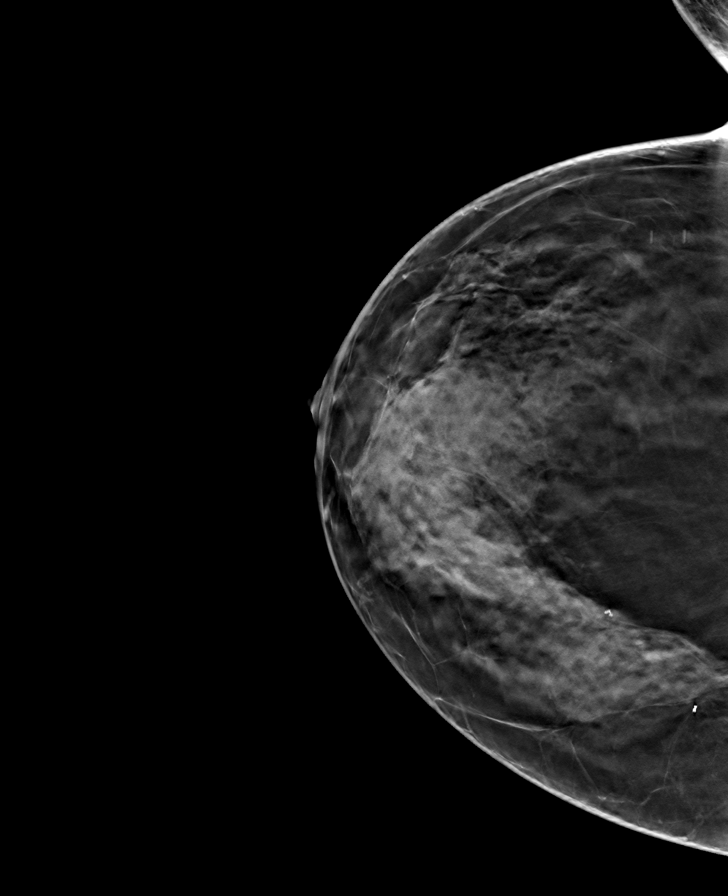

[8 of 24 positions shown; findings below may reference images not displayed]

ACR Breast Density Category c: The breast tissue is heterogeneously
dense, which may obscure small masses.
FINDINGS: There are no findings suspicious for malignancy. Images were
processed with CAD.
IMPRESSION: No mammographic evidence of malignancy. A result letter of this
screening mammogram will be mailed directly to the patient.

RECOMMENDATION:
Screening mammogram in one year. (Code:FT-U-LHB)

BI-RADS CATEGORY  1: Negative.

## 2020-10-10 ENCOUNTER — Encounter: Payer: Self-pay | Admitting: Family

## 2020-10-10 ENCOUNTER — Other Ambulatory Visit: Payer: Self-pay

## 2020-10-10 ENCOUNTER — Other Ambulatory Visit (HOSPITAL_COMMUNITY)
Admission: RE | Admit: 2020-10-10 | Discharge: 2020-10-10 | Disposition: A | Discharge status: Home / Self Care | Payer: BC Managed Care – PPO | Source: Ambulatory Visit | Attending: Family | Admitting: Family

## 2020-10-10 ENCOUNTER — Ambulatory Visit (INDEPENDENT_AMBULATORY_CARE_PROVIDER_SITE_OTHER): Payer: BC Managed Care – PPO | Admitting: Family

## 2020-10-10 VITALS — BP 118/82 | HR 81 | Temp 97.9°F | Ht 64.0 in | Wt 161.2 lb

## 2020-10-10 DIAGNOSIS — Z0000 Encounter for general adult medical examination without abnormal findings: Secondary | ICD-10-CM | POA: Insufficient documentation

## 2020-10-10 DIAGNOSIS — E785 Hyperlipidemia, unspecified: Secondary | ICD-10-CM

## 2020-10-10 DIAGNOSIS — E119 Type 2 diabetes mellitus without complications: Secondary | ICD-10-CM | POA: Diagnosis not present

## 2020-10-10 DIAGNOSIS — I10 Essential (primary) hypertension: Secondary | ICD-10-CM | POA: Diagnosis not present

## 2020-10-10 LAB — POCT GLYCOSYLATED HEMOGLOBIN (HGB A1C): Hemoglobin A1C: 7.2 % — AB (ref 4.0–5.6)

## 2020-10-10 NOTE — Patient Instructions (Addendum)
Focus on low sugar, low carb diet to improve blood sugar  Please ask Tammy to order CMP, CBC, TSH, and lipid panel.   Please reconsider CT lung screening program as we discussed.   Please call  and schedule your 3D mammogram  [facility]  [place], [place]  Referral to colonoscopy. Let us know if you dont hear back within a week in regards to an appointment being scheduled.   This is  Dr. Lupita Dawn  example of a  "Low GI"  Diet:  It will allow you to lose 4 to 8  lbs  per month if you follow it carefully.  Your goal with exercise is a minimum of 30 minutes of aerobic exercise 5 days per week (Walking does not count once it becomes easy!)    All of the foods can be found at grocery stores and in bulk at [employer].  The Atkins protein bars and shakes are available in more varieties at [employer], [employer] and [place].     7 AM Breakfast:  Choose from the following:  Low carbohydrate Protein  Shakes (I recommend the  Premier Protein chocolate shakes,  EAS AdvantEdge "Carb Control" shakes  Or the Atkins shakes all are under 3 net carbs)     a scrambled egg/bacon/cheese burrito made with Mission's "carb balance" whole wheat tortilla  (about 10 net carbs )  Regulatory affairs officer (basically a quiche without the pastry crust) that is eaten cold and very convenient way to get your eggs.  8 carbs)  If you make your own protein shakes, avoid bananas and pineapple,  And use low carb greek yogurt or original /unsweetened almond or soy milk    Avoid cereal and bananas, oatmeal and cream of wheat and grits. They are loaded with carbohydrates!   10 AM: high protein snack:  Protein bar by Atkins (the snack size, under 200 cal, usually < 6 net carbs).    A stick of cheese:  Around 1 carb,  100 cal     Dannon Light n Fit [place] Yogurt  (80 cal, 8 carbs)  Other so called "protein bars" and Greek yogurts tend to be loaded with  carbohydrates.  Remember, in food advertising, the word "energy" is synonymous for " carbohydrate."  Lunch:   A Sandwich using the bread choices listed, Can use any  Eggs,  lunchmeat, grilled meat or canned tuna), avocado, regular mayo/mustard  and cheese.  A Salad using blue cheese, ranch,  Goddess or vinagrette,  Avoid taco shells, croutons or "confetti" and no "candied nuts" but regular nuts OK.   No pretzels, nabs  or chips.  Pickles and miniature sweet peppers are a good low carb alternative that provide a "crunch"  The bread is the only source of carbohydrate in a sandwich and  can be decreased by trying some of the attached alternatives to traditional loaf bread   Avoid "Low fat dressings, as well as [place] dressings They are loaded with sugar!   3 PM/ Mid day  Snack:  Consider  1 ounce of  almonds, walnuts, pistachios, pecans, peanuts,  Macadamia nuts or a nut medley.  Avoid "granola and granola bars "  Mixed nuts are ok in moderation as long as there are no raisins,  cranberries or dried fruit.   KIND bars are OK if you get the low glycemic index variety   Try the prosciutto/mozzarella cheese sticks  by Fiorruci  In Forensic psychologist /backery section   High protein      6 PM  Dinner:     Meat/fowl/fish with a green salad, and either broccoli, cauliflower, green beans, spinach, brussel sprouts or  Lima beans. DO NOT BREAD THE PROTEIN!!      There is a low carb pasta by Dreamfield's that is acceptable and tastes great: only 5 digestible carbs/serving.( All grocery stores but BJs carry it ) Several ready made meals are available low carb:   Try Michel Angelo's chicken piccata or chicken or eggplant parm over low carb pasta.(Lowes and BJs)   Marjory Lies Sanchez's "Carnitas" (pulled pork, no sauce,  0 carbs) or his beef pot roast to make a dinner burrito (at [employer])  Pesto over low carb pasta (bj's sells a good quality pesto in the center refrigerated section of the deli   Try satueeing   Cheral Marker with mushroooms as a good side   [employer] makes a mashed cauliflower that tastes like mashed potatoes  Whole wheat pasta is still full of digestible carbs and  Not as low in glycemic index as Dreamfield's.   Brown rice is still rice,  So skip the rice and noodles if you eat [place] or [place] (or at least limit to 1/2 cup)  9 PM snack :   Breyer's "low carb" fudgsicle or  ice cream bar (Carb Smart line), or  Weight Watcher's ice cream bar , or another "no sugar added" ice cream;  a serving of fresh berries/cherries with whipped cream   Cheese or DANNON'S LlGHT N FIT GREEK YOGURT  8 ounces of [place] unsweetened almond/cococunut milk    Treat yourself to a parfait made with whipped cream blueberiies, walnuts and vanilla greek yogurt  Avoid bananas, pineapple, grapes  and watermelon on a regular basis because they are high in sugar.  THINK OF THEM AS DESSERT  Remember that snack Substitutions should be less than 10 NET carbs per serving and meals < 20 carbs. Remember to subtract fiber grams to get the "net carbs."     Health Maintenance for Postmenopausal Women Menopause is a normal process in which your ability to get pregnant comes to an end. This process happens slowly over many months or years, usually between the ages of 31 and 71. Menopause is complete when you have missed your menstrual periods for 12 months. It is important to talk with your health care provider about some of the most common conditions that affect women after menopause (postmenopausal women). These include heart disease, cancer, and bone loss (osteoporosis). Adopting a healthy lifestyle and getting preventive care can help to promote your health and wellness. The actions you take can also lower your chances of developing some of these common conditions. What should I know about menopause? During menopause, you may get a number of symptoms, such as:  Hot flashes. These can be moderate or severe.  Night  sweats.  Decrease in sex drive.  Mood swings.  Headaches.  Tiredness.  Irritability.  Memory problems.  Insomnia. Choosing to treat or not to treat these symptoms is a decision that you make with your health care provider. Do I need hormone replacement therapy?  Hormone replacement therapy is effective in treating symptoms that are caused by menopause, such as hot flashes and night sweats.  Hormone replacement carries certain risks, especially as you become older. If you are thinking about using estrogen or estrogen with progestin, discuss the benefits and risks with your  health care provider. What is my risk for heart disease and stroke? The risk of heart disease, heart attack, and stroke increases as you age. One of the causes may be a change in the body's hormones during menopause. This can affect how your body uses dietary fats, triglycerides, and cholesterol. Heart attack and stroke are medical emergencies. There are many things that you can do to help prevent heart disease and stroke. Watch your blood pressure  High blood pressure causes heart disease and increases the risk of stroke. This is more likely to develop in people who have high blood pressure readings, are of African descent, or are overweight.  Have your blood pressure checked: ? Every 3-5 years if you are 47-50 years of age. ? Every year if you are 73 years old or older. Eat a healthy diet  Eat a diet that includes plenty of vegetables, fruits, low-fat dairy products, and lean protein.  Do not eat a lot of foods that are high in solid fats, added sugars, or sodium.   Get regular exercise Get regular exercise. This is one of the most important things you can do for your health. Most adults should:  Try to exercise for at least 150 minutes each week. The exercise should increase your heart rate and make you sweat (moderate-intensity exercise).  Try to do strengthening exercises at least twice each week. Do  these in addition to the moderate-intensity exercise.  Spend less time sitting. Even light physical activity can be beneficial. Other tips  Work with your health care provider to achieve or maintain a healthy weight.  Do not use any products that contain nicotine or tobacco, such as cigarettes, e-cigarettes, and chewing tobacco. If you need help quitting, ask your health care provider.  Know your numbers. Ask your health care provider to check your cholesterol and your blood sugar (glucose). Continue to have your blood tested as directed by your health care provider. Do I need screening for cancer? Depending on your health history and family history, you may need to have cancer screening at different stages of your life. This may include screening for:  Breast cancer.  Cervical cancer.  Lung cancer.  Colorectal cancer. What is my risk for osteoporosis? After menopause, you may be at increased risk for osteoporosis. Osteoporosis is a condition in which bone destruction happens more quickly than new bone creation. To help prevent osteoporosis or the bone fractures that can happen because of osteoporosis, you may take the following actions:  If you are 67-22 years old, get at least 1,000 mg of calcium and at least 600 mg of vitamin D per day.  If you are older than age 40 but younger than age 88, get at least 1,200 mg of calcium and at least 600 mg of vitamin D per day.  If you are older than age 25, get at least 1,200 mg of calcium and at least 800 mg of vitamin D per day. Smoking and drinking excessive alcohol increase the risk of osteoporosis. Eat foods that are rich in calcium and vitamin D, and do weight-bearing exercises several times each week as directed by your health care provider. How does menopause affect my mental health? Depression may occur at any age, but it is more common as you become older. Common symptoms of depression include:  Low or sad mood.  Changes in sleep  patterns.  Changes in appetite or eating patterns.  Feeling an overall lack of motivation or enjoyment of activities that you  previously enjoyed.  Frequent crying spells. Talk with your health care provider if you think that you are experiencing depression. General instructions See your health care provider for regular wellness exams and vaccines. This may include:  Scheduling regular health, dental, and eye exams.  Getting and maintaining your vaccines. These include: ? Influenza vaccine. Get this vaccine each year before the flu season begins. ? Pneumonia vaccine. ? Shingles vaccine. ? Tetanus, diphtheria, and pertussis (Tdap) booster vaccine. Your health care provider may also recommend other immunizations. Tell your health care provider if you have ever been abused or do not feel safe at home. Summary  Menopause is a normal process in which your ability to get pregnant comes to an end.  This condition causes hot flashes, night sweats, decreased interest in sex, mood swings, headaches, or lack of sleep.  Treatment for this condition may include hormone replacement therapy.  Take actions to keep yourself healthy, including exercising regularly, eating a healthy diet, watching your weight, and checking your blood pressure and blood sugar levels.  Get screened for cancer and depression. Make sure that you are up to date with all your vaccines. This information is not intended to replace advice given to you by your health care provider. Make sure you discuss any questions you have with your health care provider. Document Revised: 06/02/2018 Document Reviewed: 06/02/2018 Elsevier Patient Education  2021 [employer].

## 2020-10-10 NOTE — Progress Notes (Signed)
Subjective:    Patient ID: Melissa [place], female    DOB: 1961-02-26, 60 y.o.   MRN: [id number]  CC: [name] is a 60 y.o. female who presents today for physical exam and follow up.     HPI: She is currently undergoing evaluation for left shoulder pain and has been out of work on STD for past month   HTN- compliant with amlodipine 5mg , lisinopril 20mg  DM- compliant with metformin 500mg  bid.due for eye exam and she will find out who insurance covers.   HLD - compliant with crestor 10mg .   Colorectal Cancer Screening: due this year.  Breast Cancer Screening: Mammogram due Cervical Cancer Screening: h/o hysterectomy for noncancerous reason for fibroids. Still has ovaries. No cervix on exam 11/2017.  due Bone Health screening/DEXA for 65+: No increased fracture risk. Defer screening at this time.  Lung Cancer Screening: she meets criteria , she declines screening.    Alcohol use:  drinks 2-3 per day. Doesn't feel the needs to cut down on drinking alcohol.  People do not feel annoyed by alcohol use. Denies feeling guilty for alcohol consumption or having an eye opener.  Smoking/tobacco use: smoker.     HISTORY:  Past Medical History:  Diagnosis Date  . Allergic rhinitis 07/11/2011  . Hepatitis C    Dx: 2011 Dr Jerilee Hoh STATES SHE WENT THROUGH TREATMENT AND STATES HEP C IS NOW RESOLVED   . Hepatitis C, chronic ([place]) 07/11/2011  . HTN (hypertension)   . Hypertension 07/11/2011  . IV drug abuse ([place])    HISTORY - greater than 10 years    Past Surgical History:  Procedure Laterality Date  . BREAST BIOPSY Left 12/05/2015   stereotactic biopsy- benign  . CHOLECYSTECTOMY  2011  . HUMERUS SURGERY     due to fracture  . HYDRADENITIS EXCISION Right 03/20/2017   Procedure: EXCISION HIDRADENITIS AXILLA;  Surgeon: Robert Bellow, MD;  Location: [facility] ORS;  Service: General;  Laterality: Right;  . LAPAROSCOPIC HYSTERECTOMY     for noncancerous reason for fibroids. Still  has ovaries.    Family History  Problem Relation Age of Onset  . Hypertension Mother   . Diabetes Father   . Hypertension Brother   . Breast cancer Neg Hx   . Colon cancer Neg Hx       ALLERGIES: Metformin and related  Current Outpatient Medications on File Prior to Visit  Medication Sig Dispense Refill  . amLODipine (NORVASC) 5 MG tablet Take 1 tablet (5 mg total) by mouth daily. 90 tablet 3  . lisinopril (ZESTRIL) 20 MG tablet Take 1 tablet by mouth once daily 90 tablet 0  . metFORMIN (GLUCOPHAGE XR) 500 MG 24 hr tablet Take 1 tablet (500 mg total) by mouth in the morning and at bedtime. 90 tablet 3  . rosuvastatin (CRESTOR) 10 MG tablet Take 1 tablet (10 mg total) by mouth daily. 90 tablet 0   No current facility-administered medications on file prior to visit.    Social History   Tobacco Use  . Smoking status: Current Every Day Smoker    Packs/day: 0.50    Years: 10.00    Pack years: 5.00    Types: Cigarettes  . Smokeless tobacco: Never Used  . Tobacco comment: 4/day  Vaping Use  . Vaping Use: Never used  Substance Use Topics  . Alcohol use: Yes    Comment: 3-4 beers per day.   . Drug use: No    Comment: h/o iv drug use  years ago    Review of Systems    Objective:    BP 118/82   Pulse 81   Temp 97.9 F (36.6 C)   Ht 5\' 4"  (1.626 m)   Wt 161 lb 3.2 oz (73.1 kg)   SpO2 99%   BMI 27.67 kg/m   BP Readings from Last 3 Encounters:  10/10/20 118/82  07/18/20 124/72  02/20/20 138/78   Wt Readings from Last 3 Encounters:  10/10/20 161 lb 3.2 oz (73.1 kg)  07/18/20 153 lb 12.8 oz (69.8 kg)  02/20/20 157 lb 3.2 oz (71.3 kg)    Physical Exam Vitals reviewed.  Constitutional:      Appearance: She is well-developed.  Eyes:     Conjunctiva/sclera: Conjunctivae normal.  Neck:     Thyroid: No thyroid mass or thyromegaly.  Cardiovascular:     Rate and Rhythm: Normal rate and regular rhythm.     Pulses: Normal pulses.     Heart sounds: Normal heart  sounds.  Pulmonary:     Effort: Pulmonary effort is normal.     Breath sounds: Normal breath sounds. No wheezing, rhonchi or rales.  Chest:  Breasts: Breasts are symmetrical.     Right: No inverted nipple, mass, nipple discharge, skin change or tenderness.     Left: No inverted nipple, mass, nipple discharge, skin change or tenderness.    Genitourinary:    Adnexa:        Right: No mass, tenderness or fullness.         Left: No mass, tenderness or fullness.       Comments: Pap performed of vaginal wall. No cervix seen on exam.  No CMT. Unable to appreciated ovaries. Lymphadenopathy:     Head:     Right side of head: No submental, submandibular, tonsillar, preauricular, posterior auricular or occipital adenopathy.     Left side of head: No submental, submandibular, tonsillar, preauricular, posterior auricular or occipital adenopathy.     Cervical:     Right cervical: No superficial, deep or posterior cervical adenopathy.    Left cervical: No superficial, deep or posterior cervical adenopathy.     Upper Body:     Right upper body: No pectoral adenopathy.     Left upper body: No pectoral adenopathy.  Skin:    General: Skin is warm and dry.  Neurological:     Mental Status: She is alert.  Psychiatric:        Speech: Speech normal.        Behavior: Behavior normal.        Thought Content: Thought content normal.        Assessment & Plan:   Problem List Items Addressed This Visit      Cardiovascular and Mediastinum   Hypertension    Controlled. Continue amlodipine 5mg , lisinopril 20mg         Endocrine   Diabetes mellitus without complication (HCC)    Lab Results  Component Value Date   HGBA1C 7.2 (A) 10/10/2020   Uncontrolled. Advised to consider GLP 1. She declines and wants to continue metformin 500mg  bid and work on lifestyle modification.       Relevant Orders   POCT HgB A1C (Completed)     Other   HLD (hyperlipidemia)    Anticipate controlled. Continue  crestor 10mg .       Routine general medical examination at a health care facility - Primary    CBE performed. No cervix seen on exam ( documented as well  11/2017 that no cervix seen). Advised she may stop cervical cancer screening in absence of cervix. She declines CT lung cancer screen. Labs to be done with employer.       Relevant Orders   MM 3D SCREEN BREAST BILATERAL   Ambulatory referral to Gastroenterology   Cytology - PAP       I am having [name] maintain her amLODipine, rosuvastatin, metFORMIN, and lisinopril.   No orders of the defined types were placed in this encounter.   Return precautions given.   Risks, benefits, and alternatives of the medications and treatment plan prescribed today were discussed, and patient expressed understanding.   Education regarding symptom management and diagnosis given to patient on AVS.   Continue to follow with Burnard Hawthorne, FNP for routine health maintenance.   [name] and I agreed with plan.   Mable Paris, FNP

## 2020-10-10 NOTE — Assessment & Plan Note (Signed)
Controlled. Continue amlodipine 5mg , lisinopril 20mg 

## 2020-10-10 NOTE — Assessment & Plan Note (Signed)
Anticipate controlled. Continue crestor 10mg .

## 2020-10-10 NOTE — Assessment & Plan Note (Signed)
CBE performed. No cervix seen on exam ( documented as well 11/2017 that no cervix seen). Advised Melissa G[place] may stop cervical cancer screening in absence of cervix. Melissa Garrison declines CT lung cancer screen. Labs to be done with employer.

## 2020-10-10 NOTE — Assessment & Plan Note (Signed)
Lab Results  Component Value Date   HGBA1C 7.2 (A) 10/10/2020   Uncontrolled. Advised to consider GLP 1. She declines and wants to continue metformin 500mg  bid and work on lifestyle modification.

## 2020-10-11 ENCOUNTER — Ambulatory Visit
Admission: RE | Admit: 2020-10-11 | Discharge: 2020-10-11 | Disposition: A | Discharge status: Home / Self Care | Payer: BC Managed Care – PPO | Source: Ambulatory Visit | Attending: Sports Medicine | Admitting: Sports Medicine

## 2020-10-11 DIAGNOSIS — S4992XA Unspecified injury of left shoulder and upper arm, initial encounter: Secondary | ICD-10-CM | POA: Diagnosis not present

## 2020-10-11 DIAGNOSIS — S4992XD Unspecified injury of left shoulder and upper arm, subsequent encounter: Secondary | ICD-10-CM

## 2020-10-11 DIAGNOSIS — M7532 Calcific tendinitis of left shoulder: Secondary | ICD-10-CM

## 2020-10-11 DIAGNOSIS — M25512 Pain in left shoulder: Secondary | ICD-10-CM | POA: Insufficient documentation

## 2020-10-11 DIAGNOSIS — M7552 Bursitis of left shoulder: Secondary | ICD-10-CM | POA: Insufficient documentation

## 2020-10-11 DIAGNOSIS — M19012 Primary osteoarthritis, left shoulder: Secondary | ICD-10-CM | POA: Diagnosis not present

## 2020-10-11 LAB — CYTOLOGY - PAP
Comment: NEGATIVE
Diagnosis: NEGATIVE
High risk HPV: NEGATIVE

## 2020-11-05 DIAGNOSIS — G8929 Other chronic pain: Secondary | ICD-10-CM | POA: Diagnosis not present

## 2020-11-05 DIAGNOSIS — M25512 Pain in left shoulder: Secondary | ICD-10-CM | POA: Diagnosis not present

## 2020-11-14 DIAGNOSIS — G8929 Other chronic pain: Secondary | ICD-10-CM | POA: Diagnosis not present

## 2020-11-14 DIAGNOSIS — M25512 Pain in left shoulder: Secondary | ICD-10-CM | POA: Diagnosis not present

## 2020-11-21 DIAGNOSIS — M25512 Pain in left shoulder: Secondary | ICD-10-CM | POA: Diagnosis not present

## 2020-11-21 DIAGNOSIS — G8929 Other chronic pain: Secondary | ICD-10-CM | POA: Diagnosis not present

## 2020-11-23 ENCOUNTER — Telehealth: Payer: BC Managed Care – PPO

## 2020-11-27 DIAGNOSIS — G8929 Other chronic pain: Secondary | ICD-10-CM | POA: Diagnosis not present

## 2020-11-27 DIAGNOSIS — M25512 Pain in left shoulder: Secondary | ICD-10-CM | POA: Diagnosis not present

## 2020-11-29 DIAGNOSIS — G8929 Other chronic pain: Secondary | ICD-10-CM | POA: Diagnosis not present

## 2020-11-29 DIAGNOSIS — M25512 Pain in left shoulder: Secondary | ICD-10-CM | POA: Diagnosis not present

## 2020-12-01 ENCOUNTER — Other Ambulatory Visit: Payer: Self-pay | Admitting: Family

## 2020-12-01 DIAGNOSIS — I10 Essential (primary) hypertension: Secondary | ICD-10-CM

## 2020-12-04 DIAGNOSIS — M25512 Pain in left shoulder: Secondary | ICD-10-CM | POA: Diagnosis not present

## 2020-12-04 DIAGNOSIS — G8929 Other chronic pain: Secondary | ICD-10-CM | POA: Diagnosis not present

## 2020-12-06 DIAGNOSIS — M25512 Pain in left shoulder: Secondary | ICD-10-CM | POA: Diagnosis not present

## 2020-12-06 DIAGNOSIS — G8929 Other chronic pain: Secondary | ICD-10-CM | POA: Diagnosis not present

## 2020-12-13 DIAGNOSIS — G8929 Other chronic pain: Secondary | ICD-10-CM | POA: Diagnosis not present

## 2020-12-13 DIAGNOSIS — M25512 Pain in left shoulder: Secondary | ICD-10-CM | POA: Diagnosis not present

## 2020-12-18 DIAGNOSIS — G8929 Other chronic pain: Secondary | ICD-10-CM | POA: Diagnosis not present

## 2020-12-18 DIAGNOSIS — M25512 Pain in left shoulder: Secondary | ICD-10-CM | POA: Diagnosis not present

## 2020-12-20 DIAGNOSIS — G8929 Other chronic pain: Secondary | ICD-10-CM | POA: Diagnosis not present

## 2020-12-20 DIAGNOSIS — M25512 Pain in left shoulder: Secondary | ICD-10-CM | POA: Diagnosis not present

## 2020-12-25 DIAGNOSIS — M25512 Pain in left shoulder: Secondary | ICD-10-CM | POA: Diagnosis not present

## 2020-12-25 DIAGNOSIS — G8929 Other chronic pain: Secondary | ICD-10-CM | POA: Diagnosis not present

## 2020-12-26 ENCOUNTER — Other Ambulatory Visit: Payer: Self-pay | Admitting: Orthopedic Surgery

## 2020-12-26 DIAGNOSIS — M67912 Unspecified disorder of synovium and tendon, left shoulder: Secondary | ICD-10-CM | POA: Diagnosis not present

## 2020-12-26 DIAGNOSIS — M7532 Calcific tendinitis of left shoulder: Secondary | ICD-10-CM | POA: Diagnosis not present

## 2021-01-01 ENCOUNTER — Other Ambulatory Visit: Payer: Self-pay

## 2021-01-01 ENCOUNTER — Encounter
Admission: RE | Admit: 2021-01-01 | Discharge: 2021-01-01 | Disposition: A | Discharge status: Home / Self Care | Payer: BC Managed Care – PPO | Source: Ambulatory Visit | Attending: Orthopedic Surgery | Admitting: Orthopedic Surgery

## 2021-01-01 HISTORY — DX: Type 2 diabetes mellitus without complications: E11.9

## 2021-01-01 HISTORY — DX: Vitamin D deficiency, unspecified: E55.9

## 2021-01-01 NOTE — Patient Instructions (Addendum)
Your procedure is scheduled on: January 04, 2021 FRIDAY Report to the Registration Desk on the 1st floor of the [facility]. To find out your arrival time, please call [phone] between 1PM - 3PM on: January 03, 2021 THURSDAY  REMEMBER: Instructions that are not followed completely may result in serious medical risk, up to and including death; or upon the discretion of your surgeon and anesthesiologist your surgery may need to be rescheduled.  Do not eat food after midnight the night before surgery.  No gum chewing, lozengers or hard candies.  You may however, drink CLEAR liquids up to 2 hours before you are scheduled to arrive for your surgery. Do not drink anything within 2 hours of your scheduled arrival time.  Clear liquids include: - water   Do NOT drink anything that is not on this list.  In addition, your doctor has ordered for you to drink the provided  Gatorade G2 Drinking this carbohydrate drink up to two hours before surgery helps to reduce insulin resistance and improve patient outcomes. Please complete drinking 2 hours prior to scheduled arrival time.  TAKE THESE MEDICATIONS THE MORNING OF SURGERY WITH A SIP OF WATER: NONE  Stop Metformin 2 days prior to surgery. LAST DOSE 01/01/2021 TUESDAY  One week prior to surgery: Stop Anti-inflammatories (NSAIDS) such as Advil, Aleve, Ibuprofen, Motrin, Naproxen, Naprosyn and ASPIRIN OR Aspirin based products such as Excedrin, Goodys Powder, BC Powder. Stop ANY OVER THE COUNTER supplements until after surgery. You may however, continue to take Tylenol if needed for pain up until the day of surgery.  No Alcohol for 24 hours before or after surgery.  No Smoking including e-cigarettes for 24 hours prior to surgery.  No chewable tobacco products for at least 6 hours prior to surgery.  No nicotine patches on the day of surgery.  Do not use any "recreational" drugs for at least a week prior to your surgery.  Please be advised that  the combination of cocaine and anesthesia may have negative outcomes, up to and including death. If you test positive for cocaine, your surgery will be cancelled.  On the morning of surgery brush your teeth with toothpaste and water, you may rinse your mouth with mouthwash if you wish. Do not swallow any toothpaste or mouthwash.  Do not wear jewelry, make-up, hairpins, clips or nail polish.  Do not wear lotions, powders, or perfumes OR DEODORANT   Do not shave body from the neck down 48 hours prior to surgery just in case you cut yourself which could leave a site for infection.  Also, freshly shaved skin may become irritated if using the CHG soap.  Contact lenses, hearing aids and dentures may not be worn into surgery.  Do not bring valuables to the hospital. [facility] is not responsible for any missing/lost belongings or valuables.   Use CHG Soap as directed on instruction sheet.  Notify your doctor if there is any change in your medical condition (cold, fever, infection).  Wear comfortable clothing (specific to your surgery type) to the hospital.  After surgery, you can help prevent lung complications by doing breathing exercises.  Take deep breaths and cough every 1-2 hours. Your doctor may order a device called an Incentive Spirometer to help you take deep breaths. When coughing or sneezing, hold a pillow firmly against your incision with both hands. This is called "splinting." Doing this helps protect your incision. It also decreases belly discomfort.  If you are being discharged the day  of surgery, you will not be allowed to drive home. You will need a responsible adult (18 years or older) to drive you home and stay with you that night.   If you are taking public transportation, you will need to have a responsible adult (18 years or older) with you. Please confirm with your physician that it is acceptable to use public transportation.   Please call the [facility]  Dept. at [phone] if you have any questions about these instructions.  Surgery Visitation Policy:  Patients undergoing a surgery or procedure may have one family member or support person with them as long as that person is not COVID-19 positive or experiencing its symptoms.  That person may remain in the waiting area during the procedure.  Inpatient Visitation:    Visiting hours are 7 a.m. to 8 p.m. Inpatients will be allowed two visitors daily. The visitors may change each day during the patient's stay. No visitors under the age of 28. Any visitor under the age of 44 must be accompanied by an adult. The visitor must pass COVID-19 screenings, use hand sanitizer when entering and exiting the patient's room and wear a mask at all times, including in the patient's room. Patients must also wear a mask when staff or their visitor are in the room. Masking is required regardless of vaccination status.

## 2021-01-03 ENCOUNTER — Encounter
Admission: RE | Admit: 2021-01-03 | Discharge: 2021-01-03 | Disposition: A | Discharge status: Home / Self Care | Payer: BC Managed Care – PPO | Source: Ambulatory Visit | Attending: Orthopedic Surgery | Admitting: Orthopedic Surgery

## 2021-01-03 ENCOUNTER — Other Ambulatory Visit: Payer: Self-pay

## 2021-01-03 DIAGNOSIS — M75122 Complete rotator cuff tear or rupture of left shoulder, not specified as traumatic: Secondary | ICD-10-CM | POA: Diagnosis not present

## 2021-01-03 DIAGNOSIS — M25812 Other specified joint disorders, left shoulder: Secondary | ICD-10-CM | POA: Diagnosis not present

## 2021-01-03 DIAGNOSIS — Z7984 Long term (current) use of oral hypoglycemic drugs: Secondary | ICD-10-CM | POA: Diagnosis not present

## 2021-01-03 DIAGNOSIS — Z79899 Other long term (current) drug therapy: Secondary | ICD-10-CM | POA: Diagnosis not present

## 2021-01-03 DIAGNOSIS — F17210 Nicotine dependence, cigarettes, uncomplicated: Secondary | ICD-10-CM | POA: Diagnosis not present

## 2021-01-03 DIAGNOSIS — M7532 Calcific tendinitis of left shoulder: Secondary | ICD-10-CM | POA: Diagnosis not present

## 2021-01-03 DIAGNOSIS — I10 Essential (primary) hypertension: Secondary | ICD-10-CM | POA: Insufficient documentation

## 2021-01-03 DIAGNOSIS — M75112 Incomplete rotator cuff tear or rupture of left shoulder, not specified as traumatic: Secondary | ICD-10-CM | POA: Diagnosis not present

## 2021-01-03 DIAGNOSIS — Z01818 Encounter for other preprocedural examination: Secondary | ICD-10-CM | POA: Insufficient documentation

## 2021-01-03 LAB — CBC
HCT: 33.3 % — ABNORMAL LOW (ref 36.0–46.0)
Hemoglobin: 11.9 g/dL — ABNORMAL LOW (ref 12.0–15.0)
MCH: 31.2 pg (ref 26.0–34.0)
MCHC: 35.7 g/dL (ref 30.0–36.0)
MCV: 87.4 fL (ref 80.0–100.0)
Platelets: 279 10*3/uL (ref 150–400)
RBC: 3.81 MIL/uL — ABNORMAL LOW (ref 3.87–5.11)
RDW: 13.6 % (ref 11.5–15.5)
WBC: 6.9 10*3/uL (ref 4.0–10.5)
nRBC: 0 % (ref 0.0–0.2)

## 2021-01-03 LAB — BASIC METABOLIC PANEL
Anion gap: 12 (ref 5–15)
BUN: 11 mg/dL (ref 6–20)
CO2: 23 mmol/L (ref 22–32)
Calcium: 9.4 mg/dL (ref 8.9–10.3)
Chloride: 104 mmol/L (ref 98–111)
Creatinine, Ser: 0.6 mg/dL (ref 0.44–1.00)
GFR, Estimated: >60 mL/min (ref >60)
Glucose, Bld: 176 mg/dL — ABNORMAL HIGH (ref 70–99)
Potassium: 3.9 mmol/L (ref 3.5–5.1)
Sodium: 139 mmol/L (ref 135–145)

## 2021-01-04 ENCOUNTER — Ambulatory Visit: Payer: BC Managed Care – PPO

## 2021-01-04 ENCOUNTER — Ambulatory Visit: Payer: BC Managed Care – PPO | Admitting: Anesthesiology

## 2021-01-04 ENCOUNTER — Encounter: Payer: Self-pay | Admitting: Orthopedic Surgery

## 2021-01-04 ENCOUNTER — Encounter
Admission: RE | Disposition: A | Discharge status: Home / Self Care | Payer: Self-pay | Source: Home / Self Care | Attending: Orthopedic Surgery

## 2021-01-04 ENCOUNTER — Other Ambulatory Visit: Payer: Self-pay

## 2021-01-04 ENCOUNTER — Ambulatory Visit
Admission: RE | Admit: 2021-01-04 | Discharge: 2021-01-04 | Disposition: A | Discharge status: Home / Self Care | Payer: BC Managed Care – PPO | Attending: Orthopedic Surgery | Admitting: Orthopedic Surgery

## 2021-01-04 DIAGNOSIS — M25812 Other specified joint disorders, left shoulder: Secondary | ICD-10-CM | POA: Insufficient documentation

## 2021-01-04 DIAGNOSIS — M7532 Calcific tendinitis of left shoulder: Secondary | ICD-10-CM | POA: Insufficient documentation

## 2021-01-04 DIAGNOSIS — M75122 Complete rotator cuff tear or rupture of left shoulder, not specified as traumatic: Secondary | ICD-10-CM | POA: Insufficient documentation

## 2021-01-04 DIAGNOSIS — I10 Essential (primary) hypertension: Secondary | ICD-10-CM | POA: Diagnosis not present

## 2021-01-04 DIAGNOSIS — F17210 Nicotine dependence, cigarettes, uncomplicated: Secondary | ICD-10-CM | POA: Diagnosis not present

## 2021-01-04 DIAGNOSIS — M65812 Other synovitis and tenosynovitis, left shoulder: Secondary | ICD-10-CM | POA: Diagnosis not present

## 2021-01-04 DIAGNOSIS — Z79899 Other long term (current) drug therapy: Secondary | ICD-10-CM | POA: Diagnosis not present

## 2021-01-04 DIAGNOSIS — Z7984 Long term (current) use of oral hypoglycemic drugs: Secondary | ICD-10-CM | POA: Diagnosis not present

## 2021-01-04 DIAGNOSIS — M7522 Bicipital tendinitis, left shoulder: Secondary | ICD-10-CM | POA: Diagnosis not present

## 2021-01-04 DIAGNOSIS — Z419 Encounter for procedure for purposes other than remedying health state, unspecified: Secondary | ICD-10-CM

## 2021-01-04 DIAGNOSIS — M7542 Impingement syndrome of left shoulder: Secondary | ICD-10-CM | POA: Diagnosis not present

## 2021-01-04 DIAGNOSIS — M7582 Other shoulder lesions, left shoulder: Secondary | ICD-10-CM | POA: Diagnosis not present

## 2021-01-04 DIAGNOSIS — M75112 Incomplete rotator cuff tear or rupture of left shoulder, not specified as traumatic: Secondary | ICD-10-CM | POA: Diagnosis not present

## 2021-01-04 HISTORY — PX: SHOULDER ARTHROSCOPY WITH SUBACROMIAL DECOMPRESSION AND OPEN ROTATOR C: SHX5688

## 2021-01-04 LAB — GLUCOSE, CAPILLARY: Glucose-Capillary: 185 mg/dL — ABNORMAL HIGH (ref 70–99)

## 2021-01-04 SURGERY — SHOULDER ARTHROSCOPY WITH SUBACROMIAL DECOMPRESSION AND OPEN ROTATOR CUFF REPAIR, OPEN BICEPS TENDON REPAIR
Anesthesia: General | Laterality: Left

## 2021-01-04 MED ORDER — ORAL CARE MOUTH RINSE: 15.0000 mL | Freq: Once | OROMUCOSAL | Status: AC

## 2021-01-04 MED ORDER — EPINEPHRINE PF 1 MG/ML IJ SOLN
INTRAMUSCULAR | Status: AC
  Filled 2021-01-04: qty 4

## 2021-01-04 MED ORDER — LACTATED RINGERS IV SOLN
INTRAVENOUS | Status: DC | PRN
  Administered 2021-01-04: 4 mL

## 2021-01-04 MED ORDER — PROPOFOL 10 MG/ML IV BOLUS
INTRAVENOUS | Status: DC | PRN
  Administered 2021-01-04: 120 mg via INTRAVENOUS

## 2021-01-04 MED ORDER — FENTANYL CITRATE (PF) 100 MCG/2ML IJ SOLN
INTRAMUSCULAR | Status: DC | PRN
  Administered 2021-01-04: 50 ug via INTRAVENOUS

## 2021-01-04 MED ORDER — LACTATED RINGERS IV SOLN: INTRAVENOUS | Status: DC | PRN

## 2021-01-04 MED ORDER — BUPIVACAINE HCL (PF) 0.5 % IJ SOLN
INTRAMUSCULAR | Status: AC
  Filled 2021-01-04: qty 10

## 2021-01-04 MED ORDER — BUPIVACAINE LIPOSOME 1.3 % IJ SUSP
INTRAMUSCULAR | Status: AC
  Filled 2021-01-04: qty 20

## 2021-01-04 MED ORDER — ASPIRIN EC 325 MG PO TBEC: 325.0000 mg | DELAYED_RELEASE_TABLET | Freq: Every day | ORAL | 0 refills | Status: AC

## 2021-01-04 MED ORDER — PHENYLEPHRINE HCL (PRESSORS) 10 MG/ML IV SOLN
INTRAVENOUS | Status: AC
  Filled 2021-01-04: qty 1

## 2021-01-04 MED ORDER — LIDOCAINE HCL (PF) 1 % IJ SOLN
INTRAMUSCULAR | Status: AC
  Filled 2021-01-04: qty 5

## 2021-01-04 MED ORDER — OXYCODONE HCL 5 MG PO TABS: 5.0000 mg | ORAL_TABLET | ORAL | 0 refills | Status: DC | PRN

## 2021-01-04 MED ORDER — FAMOTIDINE 20 MG PO TABS
ORAL_TABLET | ORAL | Status: AC
  Administered 2021-01-04: 20 mg via ORAL
  Filled 2021-01-04: qty 1

## 2021-01-04 MED ORDER — LIDOCAINE HCL (PF) 1 % IJ SOLN
INTRAMUSCULAR | Status: DC | PRN
  Administered 2021-01-04: 3 mL

## 2021-01-04 MED ORDER — 0.9 % SODIUM CHLORIDE (POUR BTL) OPTIME
TOPICAL | Status: DC | PRN
  Administered 2021-01-04: 500 mL

## 2021-01-04 MED ORDER — ACETAMINOPHEN 500 MG PO TABS: 1000.0000 mg | ORAL_TABLET | Freq: Three times a day (TID) | ORAL | 2 refills | Status: DC

## 2021-01-04 MED ORDER — BUPIVACAINE HCL (PF) 0.5 % IJ SOLN
INTRAMUSCULAR | Status: AC
  Filled 2021-01-04: qty 30

## 2021-01-04 MED ORDER — FENTANYL CITRATE (PF) 100 MCG/2ML IJ SOLN
INTRAMUSCULAR | Status: AC
  Administered 2021-01-04: 50 ug via INTRAVENOUS
  Filled 2021-01-04: qty 2

## 2021-01-04 MED ORDER — MIDAZOLAM HCL 2 MG/2ML IJ SOLN
INTRAMUSCULAR | Status: AC
  Administered 2021-01-04: 1 mg via INTRAVENOUS
  Filled 2021-01-04: qty 2

## 2021-01-04 MED ORDER — CHLORHEXIDINE GLUCONATE 0.12 % MT SOLN
OROMUCOSAL | Status: AC
  Administered 2021-01-04: 15 mL via OROMUCOSAL
  Filled 2021-01-04: qty 15

## 2021-01-04 MED ORDER — LABETALOL HCL 5 MG/ML IV SOLN
INTRAVENOUS | Status: DC | PRN
  Administered 2021-01-04: 5 mg via INTRAVENOUS

## 2021-01-04 MED ORDER — FENTANYL CITRATE (PF) 100 MCG/2ML IJ SOLN
INTRAMUSCULAR | Status: AC
  Filled 2021-01-04: qty 2

## 2021-01-04 MED ORDER — CEFAZOLIN SODIUM-DEXTROSE 2-4 GM/100ML-% IV SOLN
2.0000 g | INTRAVENOUS | Status: AC
  Administered 2021-01-04: 2 g via INTRAVENOUS

## 2021-01-04 MED ORDER — DEXAMETHASONE SODIUM PHOSPHATE 10 MG/ML IJ SOLN
INTRAMUSCULAR | Status: DC | PRN
  Administered 2021-01-04: 10 mg via INTRAVENOUS

## 2021-01-04 MED ORDER — FENTANYL CITRATE (PF) 100 MCG/2ML IJ SOLN
50.0000 ug | INTRAMUSCULAR | Status: DC | PRN
Start: 2021-01-04 — End: 2021-01-04

## 2021-01-04 MED ORDER — ROCURONIUM BROMIDE 100 MG/10ML IV SOLN
INTRAVENOUS | Status: DC | PRN
  Administered 2021-01-04: 50 mg via INTRAVENOUS
  Administered 2021-01-04: 20 mg via INTRAVENOUS

## 2021-01-04 MED ORDER — CEFAZOLIN SODIUM-DEXTROSE 2-4 GM/100ML-% IV SOLN
INTRAVENOUS | Status: AC
  Filled 2021-01-04: qty 100

## 2021-01-04 MED ORDER — SUGAMMADEX SODIUM 200 MG/2ML IV SOLN
INTRAVENOUS | Status: DC | PRN
  Administered 2021-01-04: 200 mg via INTRAVENOUS

## 2021-01-04 MED ORDER — LIDOCAINE HCL (CARDIAC) PF 100 MG/5ML IV SOSY
PREFILLED_SYRINGE | INTRAVENOUS | Status: DC | PRN
  Administered 2021-01-04: 80 mg via INTRAVENOUS

## 2021-01-04 MED ORDER — MIDAZOLAM HCL 2 MG/2ML IJ SOLN: 1.0000 mg | Freq: Once | INTRAMUSCULAR | Status: AC

## 2021-01-04 MED ORDER — FENTANYL CITRATE (PF) 100 MCG/2ML IJ SOLN: 25.0000 ug | INTRAMUSCULAR | Status: DC | PRN

## 2021-01-04 MED ORDER — SODIUM CHLORIDE 0.9 % IV SOLN: INTRAVENOUS | Status: DC

## 2021-01-04 MED ORDER — CHLORHEXIDINE GLUCONATE 0.12 % MT SOLN: 15.0000 mL | Freq: Once | OROMUCOSAL | Status: AC

## 2021-01-04 MED ORDER — BUPIVACAINE HCL (PF) 0.5 % IJ SOLN
INTRAMUSCULAR | Status: DC | PRN
  Administered 2021-01-04: 10 mL via PERINEURAL

## 2021-01-04 MED ORDER — ONDANSETRON HCL 4 MG/2ML IJ SOLN
INTRAMUSCULAR | Status: DC | PRN
  Administered 2021-01-04: 4 mg via INTRAVENOUS

## 2021-01-04 MED ORDER — PROPOFOL 10 MG/ML IV BOLUS
INTRAVENOUS | Status: AC
  Filled 2021-01-04: qty 20

## 2021-01-04 MED ORDER — BUPIVACAINE LIPOSOME 1.3 % IJ SUSP
INTRAMUSCULAR | Status: DC | PRN
  Administered 2021-01-04: 20 mL via PERINEURAL

## 2021-01-04 MED ORDER — ONDANSETRON 4 MG PO TBDP: 4.0000 mg | ORAL_TABLET | Freq: Three times a day (TID) | ORAL | 0 refills | Status: DC | PRN

## 2021-01-04 MED ORDER — MIDAZOLAM HCL 2 MG/2ML IJ SOLN: 0.5000 mg | INTRAMUSCULAR | Status: DC | PRN

## 2021-01-04 MED ORDER — ONDANSETRON HCL 4 MG/2ML IJ SOLN: 4.0000 mg | Freq: Once | INTRAMUSCULAR | Status: DC | PRN

## 2021-01-04 MED ORDER — PHENYLEPHRINE HCL (PRESSORS) 10 MG/ML IV SOLN
INTRAVENOUS | Status: DC | PRN
  Administered 2021-01-04: 200 ug via INTRAVENOUS

## 2021-01-04 MED ORDER — SODIUM CHLORIDE 0.9 % IV SOLN
INTRAVENOUS | Status: DC | PRN
  Administered 2021-01-04: 25 ug via INTRAVENOUS

## 2021-01-04 MED ORDER — FAMOTIDINE 20 MG PO TABS: 20.0000 mg | ORAL_TABLET | Freq: Once | ORAL | Status: AC

## 2021-01-04 MED ORDER — LACTATED RINGERS IR SOLN
Status: DC | PRN
  Administered 2021-01-04: 21000 mL

## 2021-01-04 SURGICAL SUPPLY — 85 items
ADAPTER IRRIG TUBE 2 SPIKE SOL (ADAPTER) ×4 IMPLANT
ADH SKN CLS APL DERMABOND .7 (GAUZE/BANDAGES/DRESSINGS)
ADPR TBG 2 SPK PMP STRL ASCP (ADAPTER) ×2
ANCH SUT 2 SWLK 19.1 CLS EYLT (Anchor) ×1 IMPLANT
ANCH SUT 2X2.3 2 STRN TPE (Anchor) ×1 IMPLANT
ANCH SUT SHRT 12.5 CANN EYLT (Anchor) ×1 IMPLANT
ANCH SUT SWLK 19.1X5.5 CLS (Anchor) ×1 IMPLANT
ANCHOR ICONIX SPEED 2.0 TAPE (Anchor) ×2 IMPLANT
ANCHOR SUT BIOCOMP LK 2.9X12.5 (Anchor) ×2 IMPLANT
ANCHOR SWIVELOCK BIO 4.75X19.1 (Anchor) ×2 IMPLANT
ANCHOR SWIVELOCK BIO COMP (Anchor) ×2 IMPLANT
APL PRP STRL LF DISP 70% ISPRP (MISCELLANEOUS) ×1
BLADE SHAVER 4.5X7 STR FR (MISCELLANEOUS) ×2 IMPLANT
BNDG ADH 2 X3.75 FABRIC TAN LF (GAUZE/BANDAGES/DRESSINGS) ×2 IMPLANT
BNDG ADH XL 3.75X2 STRCH LF (GAUZE/BANDAGES/DRESSINGS) ×1
BUR BR 5.5 12 FLUTE (BURR) IMPLANT
BUR BR 5.5 WIDE MOUTH (BURR) ×2 IMPLANT
CANNULA PART THRD DISP 5.75X7 (CANNULA) IMPLANT
CANNULA PARTIAL THREAD 2X7 (CANNULA) IMPLANT
CANNULA TWIST IN 8.25X9CM (CANNULA) ×2 IMPLANT
CHLORAPREP W/TINT 26 (MISCELLANEOUS) ×2 IMPLANT
COOLER POLAR GLACIER W/PUMP (MISCELLANEOUS) ×2 IMPLANT
DERMABOND ADVANCED (GAUZE/BANDAGES/DRESSINGS)
DERMABOND ADVANCED .7 DNX12 (GAUZE/BANDAGES/DRESSINGS) IMPLANT
DRAPE 3/4 80X56 (DRAPES) ×2 IMPLANT
DRAPE IMP U-DRAPE 54X76 (DRAPES) ×4 IMPLANT
DRAPE INCISE IOBAN 66X45 STRL (DRAPES) ×2 IMPLANT
DRAPE U-SHAPE 47X51 STRL (DRAPES) ×4 IMPLANT
DRSG TEGADERM 4X4.75 (GAUZE/BANDAGES/DRESSINGS) ×8 IMPLANT
ELECT REM PT RETURN 9FT ADLT (ELECTROSURGICAL) ×2
ELECTRODE REM PT RTRN 9FT ADLT (ELECTROSURGICAL) ×1 IMPLANT
GAUZE SPONGE 4X4 12PLY STRL (GAUZE/BANDAGES/DRESSINGS) ×2 IMPLANT
GAUZE XEROFORM 1X8 LF (GAUZE/BANDAGES/DRESSINGS) ×2 IMPLANT
GLOVE SRG 8 PF TXTR STRL LF DI (GLOVE) ×2 IMPLANT
GLOVE SURG ENC MOIS LTX SZ7.5 (GLOVE) ×2 IMPLANT
GLOVE SURG ORTHO LTX SZ8 (GLOVE) ×2 IMPLANT
GLOVE SURG SYN 8.0 (GLOVE) ×2 IMPLANT
GLOVE SURG UNDER POLY LF SZ8 (GLOVE) ×4
GOWN STRL REUS W/ TWL LRG LVL3 (GOWN DISPOSABLE) ×2 IMPLANT
GOWN STRL REUS W/TWL LRG LVL3 (GOWN DISPOSABLE) ×4
GOWN STRL REUS W/TWL XL LVL4 (GOWN DISPOSABLE) ×2 IMPLANT
IV LACTATED RINGER IRRG 3000ML (IV SOLUTION) ×14
IV LR IRRIG 3000ML ARTHROMATIC (IV SOLUTION) ×7 IMPLANT
KIT CORKSCREW KNTLS 3.9 S/T/P (INSTRUMENTS) IMPLANT
KIT INSERTION 2.9 PUSHLOCK (KITS) ×2 IMPLANT
KIT STABILIZATION SHOULDER (MISCELLANEOUS) ×2 IMPLANT
KIT SUTURETAK 3.0 INSERT PERC (KITS) IMPLANT
KIT TURNOVER KIT A (KITS) ×2 IMPLANT
MANIFOLD NEPTUNE II (INSTRUMENTS) ×4 IMPLANT
MASK FACE SPIDER DISP (MASK) ×2 IMPLANT
MAT ABSORB  FLUID 56X50 GRAY (MISCELLANEOUS) ×2
MAT ABSORB FLUID 56X50 GRAY (MISCELLANEOUS) ×2 IMPLANT
NDL MAYO CATGUT SZ5 (NEEDLE)
NDL SAFETY ECLIPSE 18X1.5 (NEEDLE) ×1 IMPLANT
NDL SUT 5 .5 CRC TPR PNT MAYO (NEEDLE) IMPLANT
NEEDLE HYPO 18GX1.5 SHARP (NEEDLE) ×2
NEEDLE SCORPION MULTI FIRE (NEEDLE) IMPLANT
PACK ARTHROSCOPY SHOULDER (MISCELLANEOUS) ×2 IMPLANT
PAD ARMBOARD 7.5X6 YLW CONV (MISCELLANEOUS) ×2 IMPLANT
PAD WRAPON POLAR SHDR XLG (MISCELLANEOUS) ×1 IMPLANT
PASSER SUT FIRSTPASS SELF (INSTRUMENTS) ×2 IMPLANT
PASSER SUT SWIFTSTITCH HIP CRT (INSTRUMENTS) ×2 IMPLANT
PENCIL SMOKE EVACUATOR (MISCELLANEOUS) ×2 IMPLANT
SHAVER BLADE BONE CUTTER  5.5 (BLADE) ×1
SHAVER BLADE BONE CUTTER 5.5 (BLADE) ×1 IMPLANT
SLING ULTRA II M (MISCELLANEOUS) IMPLANT
SPONGE T-LAP 18X18 ~~LOC~~+RFID (SPONGE) ×2 IMPLANT
STAPLER SKIN PROX 35W (STAPLE) IMPLANT
STRAP SAFETY 5IN WIDE (MISCELLANEOUS) ×2 IMPLANT
SUT ETHILON 3-0 (SUTURE) ×2 IMPLANT
SUT LASSO 90 DEG CVD (SUTURE) IMPLANT
SUT LASSO 90 DEG SD STR (SUTURE) IMPLANT
SUT MNCRL 4-0 (SUTURE)
SUT MNCRL 4-0 27XMFL (SUTURE)
SUT PROLENE 0 CT 2 (SUTURE) IMPLANT
SUT VIC AB 0 CT1 36 (SUTURE) IMPLANT
SUT VIC AB 2-0 CT2 27 (SUTURE) IMPLANT
SUTURE MNCRL 4-0 27XMF (SUTURE) IMPLANT
TAPE CLOTH 3X10 WHT NS LF (GAUZE/BANDAGES/DRESSINGS) ×2 IMPLANT
TAPE MICROFOAM 4IN (TAPE) ×2 IMPLANT
TUBING CONNECTING 10 (TUBING) IMPLANT
TUBING INFLOW SET DBFLO PUMP (TUBING) ×2 IMPLANT
TUBING OUTFLOW SET DBLFO PUMP (TUBING) ×2 IMPLANT
WAND WEREWOLF FLOW 90D (MISCELLANEOUS) ×2 IMPLANT
WRAPON POLAR PAD SHDR XLG (MISCELLANEOUS) ×2

## 2021-01-04 NOTE — Anesthesia Postprocedure Evaluation (Signed)
Anesthesia Post Note  Patient: Melissa Garrison  Procedure(s) Performed: Left shoulder arthroscopic rotator cuff repair  and subacromial decompression, and biceps tenodesis (Left)  Patient location during evaluation: PACU Anesthesia Type: General Level of consciousness: awake and alert and oriented Pain management: pain level controlled Vital Signs Assessment: post-procedure vital signs reviewed and stable Respiratory status: spontaneous breathing, nonlabored ventilation and respiratory function stable Cardiovascular status: blood pressure returned to baseline and stable Postop Assessment: no signs of nausea or vomiting Anesthetic complications: no   No notable events documented.   Last Vitals:  Vitals:   01/04/21 1045 01/04/21 1105  BP: (!) 145/70 (!) 149/76  Pulse: 80 75  Resp: 20 16  Temp: (!) 36.1 C (!) 36.2 C  SpO2: 97% 99%    Last Pain:  Vitals:   01/04/21 1105  TempSrc: Temporal  PainSc: 0-No pain                 Vladimir Lenhoff

## 2021-01-04 NOTE — Discharge Instructions (Addendum)
Post-Op Instructions - Rotator Cuff Repair  1. Bracing: You will wear a shoulder immobilizer or sling for 6 weeks.   2. Driving: No driving for 3 weeks post-op. When driving, do not wear the immobilizer. Ideally, we recommend no driving for 6 weeks while sling is in place as one arm will be immobilized.   3. Activity: No active lifting for 2 months. Wrist, hand, and elbow motion only. Avoid lifting the upper arm away from the body except for hygiene. You are permitted to bend and straighten the elbow passively only (no active elbow motion). You may use your hand and wrist for typing, writing, and managing utensils (cutting food). Do not lift more than a coffee cup for 8 weeks.  When sleeping or resting, inclined positions (recliner chair or wedge pillow) and a pillow under the forearm for support may provide better comfort for up to 4 weeks.  Avoid long distance travel for 4 weeks.  Return to normal activities after rotator cuff repair repair normally takes 6 months on average. If rehab goes very well, may be able to do most activities at 4 months, except overhead or contact sports.  4. Physical Therapy: Begins 3-4 days after surgery, and proceed 1 time per week for the first 6 weeks, then 1-2 times per week from weeks 6-20 post-op.  5. Medications:  - You will be provided a prescription for narcotic pain medicine. After surgery, take 1-2 narcotic tablets every 4 hours if needed for severe pain.  - A prescription for anti-nausea medication will be provided in case the narcotic medicine causes nausea - take 1 tablet every 6 hours only if nauseated.   - Take tylenol 1000 mg (2 Extra Strength tablets or 3 regular strength) every 8 hours for pain.  May decrease or stop tylenol 5 days after surgery if you are having minimal pain. - Take ASA 325mg/day x 2 weeks to help prevent DVTs/PEs (blood clots).  - DO NOT take ANY nonsteroidal anti-inflammatory pain medications (Advil, Motrin, Ibuprofen, Aleve,  Naproxen, or Naprosyn). These medicines can inhibit healing of your shoulder repair.    If you are taking prescription medication for anxiety, depression, insomnia, muscle spasm, chronic pain, or for attention deficit disorder, you are advised that you are at a higher risk of adverse effects with use of narcotics post-op, including narcotic addiction/dependence, depressed breathing, death. If you use non-prescribed substances: alcohol, marijuana, cocaine, heroin, methamphetamines, etc., you are at a higher risk of adverse effects with use of narcotics post-op, including narcotic addiction/dependence, depressed breathing, death. You are advised that taking > 50 morphine milligram equivalents (MME) of narcotic pain medication per day results in twice the risk of overdose or death. For your prescription provided: oxycodone 5 mg - taking more than 6 tablets per day would result in > 50 morphine milligram equivalents (MME) of narcotic pain medication. Be advised that we will prescribe narcotics short-term, for acute post-operative pain only - 3 weeks for major operations such as shoulder repair/reconstruction surgeries.     6. Post-Op Appointment:  Your first post-op appointment will be 10-14 days post-op.  7. Work or School: For most, but not all procedures, we advise staying out of work or school for at least 1 to 2 weeks in order to recover from the stress of surgery and to allow time for healing.   If you need a work or school note this can be provided.   8. Smoking: If you are a smoker, you need to refrain from   smoking in the postoperative period. The nicotine in cigarettes will inhibit healing of your shoulder repair and decrease the chance of successful repair. Similarly, nicotine containing products (gum, patches) should be avoided.   Post-operative Brace: Apply and remove the brace you received as you were instructed to at the time of fitting and as described in detail as the brace's  instructions for use indicate.  Wear the brace for the period of time prescribed by your physician.  The brace can be cleaned with soap and water and allowed to air dry only.  Should the brace result in increased pain, decreased feeling (numbness/tingling), increased swelling or an overall worsening of your medical condition, please contact your doctor immediately.  If an emergency situation occurs as a result of wearing the brace after normal business hours, please dial 911 and seek immediate medical attention.  Let your doctor know if you have any further questions about the brace issued to you. Refer to the shoulder sling instructions for use if you have any questions regarding the correct fit of your shoulder sling.  [place] [facility]: [phone]  Video that illustrates how to properly use a shoulder sling: "Instructions for Proper Use of an Orthopaedic Sling" [email or web address]    AMBULATORY SURGERY  DISCHARGE INSTRUCTIONS   The drugs that you were given will stay in your system until tomorrow so for the next 24 hours you should not:  Drive an automobile Make any legal decisions Drink any alcoholic beverage   You may resume regular meals tomorrow.  Today it is better to start with liquids and gradually work up to solid foods.  You may eat anything you prefer, but it is better to start with liquids, then soup and crackers, and gradually work up to solid foods.   Please notify your doctor immediately if you have any unusual bleeding, trouble breathing, redness and pain at the surgery site, drainage, fever, or pain not relieved by medication.    Your post-operative visit with Dr.                                       is: Date:                        Time:    Please call to schedule your post-operative visit.  Additional Instructions:   AMBULATORY SURGERY  DISCHARGE INSTRUCTIONS   The drugs that you were given will stay in  your system until tomorrow so for the next 24 hours you should not:  Drive an automobile Make any legal decisions Drink any alcoholic beverage   You may resume regular meals tomorrow.  Today it is better to start with liquids and gradually work up to solid foods.  You may eat anything you prefer, but it is better to start with liquids, then soup and crackers, and gradually work up to solid foods.   Please notify your doctor immediately if you have any unusual bleeding, trouble breathing, redness and pain at the surgery site, drainage, fever, or pain not relieved by medication.     Your post-operative visit with Dr.                                       is: Date:  Time:    Please call to schedule your post-operative visit.  Additional Instructions:

## 2021-01-04 NOTE — Anesthesia Preprocedure Evaluation (Signed)
Anesthesia Evaluation  Patient identified by MRN, date of birth, ID band Patient awake    Reviewed: Allergy & Precautions, NPO status , Patient's Chart, lab work & pertinent test results  History of Anesthesia Complications Negative for: history of anesthetic complications  Airway Mallampati: II  TM Distance: >3 FB Neck ROM: Full    Dental  (+) Poor Dentition, Missing   Pulmonary neg sleep apnea, neg COPD, Current SmokerPatient did not abstain from smoking.,    breath sounds clear to auscultation- rhonchi (-) wheezing      Cardiovascular Exercise Tolerance: Good hypertension, Pt. on medications (-) CAD, (-) Past MI, (-) Cardiac Stents and (-) CABG  Rhythm:Regular Rate:Normal - Systolic murmurs and - Diastolic murmurs    Neuro/Psych neg Seizures negative neurological ROS  negative psych ROS   GI/Hepatic negative GI ROS,   Endo/Other  diabetes, Oral Hypoglycemic Agents  Renal/GU negative Renal ROS     Musculoskeletal negative musculoskeletal ROS (+)   Abdominal (+) - obese,   Peds  Hematology negative hematology ROS (+)   Anesthesia Other Findings Past Medical History: 07/11/2011: Allergic rhinitis No date: Diabetes mellitus without complication ([place]) No date: Hepatitis C     Comment:  Dx: 2011 Dr Jerilee Hoh STATES SHE WENT THROUGH TREATMENT              AND STATES HEP C IS NOW RESOLVED  07/11/2011: Hepatitis C, chronic (HCC) No date: HTN (hypertension) 07/11/2011: Hypertension No date: IV drug abuse ([place])     Comment:  HISTORY - greater than 10 years No date: Vitamin D deficiency   Reproductive/Obstetrics                             Anesthesia Physical Anesthesia Plan  ASA: 2  Anesthesia Plan: General   Post-op Pain Management:    Induction: Intravenous  PONV Risk Score and Plan: 1 and Ondansetron, Dexamethasone and Midazolam  Airway Management Planned: Oral  ETT  Additional Equipment:   Intra-op Plan:   Post-operative Plan: Extubation in OR  Informed Consent: I have reviewed the patients History and Physical, chart, labs and discussed the procedure including the risks, benefits and alternatives for the proposed anesthesia with the patient or authorized representative who has indicated his/her understanding and acceptance.     Dental advisory given  Plan Discussed with: CRNA and Anesthesiologist  Anesthesia Plan Comments:         Anesthesia Quick Evaluation

## 2021-01-04 NOTE — Transfer of Care (Signed)
Immediate Anesthesia Transfer of Care Note  Patient: Melissa Garrison  Procedure(s) Performed: Left shoulder arthroscopic vs mini-open calcific tendinitis excision/debridement, possible rotator cuff repair vs Regeneten patch application, subacromial decompression, and biceps tenodesis (Left)  Patient Location: PACU  Anesthesia Type:General  Level of Consciousness: awake and alert   Airway & Oxygen Therapy: Patient Spontanous Breathing and Patient connected to face mask oxygen  Post-op Assessment: Report given to RN and Post -op Vital signs reviewed and stable  Post vital signs: Reviewed and stable  Last Vitals:  Vitals Value Taken Time  BP 146/64 01/04/21 1015  Temp    Pulse 89 01/04/21 1015  Resp 19 01/04/21 1015  SpO2 100 % 01/04/21 1015  Vitals shown include unvalidated device data.  Last Pain:  Vitals:   01/04/21 0615  TempSrc: Temporal  PainSc: 3          Complications: No notable events documented.

## 2021-01-04 NOTE — Anesthesia Procedure Notes (Signed)
Procedure Name: Intubation Date/Time: 01/04/2021 7:40 AM Performed by: Philbert Riser, CRNA Pre-anesthesia Checklist: Patient identified, Emergency Drugs available, Suction available and Patient being monitored Patient Re-evaluated:Patient Re-evaluated prior to induction Oxygen Delivery Method: Circle system utilized Preoxygenation: Pre-oxygenation with 100% oxygen Induction Type: IV induction Ventilation: Mask ventilation without difficulty Laryngoscope Size: McGraph and 3 Grade View: Grade II Tube type: Oral Tube size: 7.0 mm Number of attempts: 1 Airway Equipment and Method: Stylet and Oral airway Placement Confirmation: ETT inserted through vocal cords under direct vision, positive ETCO2 and breath sounds checked- equal and bilateral Secured at: 21 cm Tube secured with: Tape Dental Injury: Teeth and Oropharynx as per pre-operative assessment

## 2021-01-04 NOTE — Anesthesia Procedure Notes (Signed)
Anesthesia Regional Block: Interscalene brachial plexus block   Pre-Anesthetic Checklist: , timeout performed,  Correct Patient, Correct Site, Correct Laterality,  Correct Procedure, Correct Position, site marked,  Risks and benefits discussed,  Surgical consent,  Pre-op evaluation,  At surgeon's request and post-op pain management  Laterality: Left  Prep: chloraprep       Needles:  Injection technique: Single-shot  Needle Type: Stimiplex     Needle Length: 10cm  Needle Gauge: 21     Additional Needles:   Procedures:,,,, ultrasound used (permanent image in chart),,    Narrative:  Start time: 01/04/2021 7:15 AM End time: 01/04/2021 7:20 AM Injection made incrementally with aspirations every 5 mL.  Performed by: Personally  Anesthesiologist: Emmie Niemann, MD  Additional Notes: Functioning IV was confirmed and monitors were applied.  A Stimuplex needle was used. Sterile prep and drape,hand hygiene and sterile gloves were used.  Negative aspiration and negative test dose prior to incremental administration of local anesthetic. The patient tolerated the procedure well.

## 2021-01-04 NOTE — H&P (Signed)
Paper H&P to be scanned into permanent record. H&P reviewed. No significant changes noted.  

## 2021-01-04 NOTE — Op Note (Signed)
SURGERY DATE: 01/04/2021   PRE-OP DIAGNOSIS:  1. Left subacromial impingement 2. Left biceps tendinopathy 3. Left calcific tendinitis and partial-thickness rotator cuff tear   POST-OP DIAGNOSIS: 1. Left subacromial impingement 2. Left biceps tendinopathy 3. Left full-thickness rotator cuff tear   PROCEDURES:  1. Left arthroscopic rotator cuff repair 2. Left arthroscopic biceps tenodesis 3. Left arthroscopic subacromial decompression 4. Left arthroscopic extensive debridement of shoulder (glenohumeral and subacromial spaces)   SURGEON: Cato Mulligan, MD   ASSISTANT: Anitra Lauth, PA   ANESTHESIA: Gen with Exparil interscalene block   ESTIMATED BLOOD LOSS: 5cc   DRAINS:  none   TOTAL IV FLUIDS: per anesthesia      SPECIMENS: none   IMPLANTS:  - Arthrex 2.60mm PushLock x 1 - Arthrex 5.44mm SwiveLock x 1 - Iconix SPEED double loaded with 2.40mm tape x 1     OPERATIVE FINDINGS:  Examination under anesthesia: A careful examination under anesthesia was performed.  Passive range of motion was: FF: 150; ER at side: 45; ER in abduction: 90; IR in abduction: 45.  Anterior load shift: NT.  Posterior load shift: NT.  Sulcus in neutral: NT.  Sulcus in ER: NT.     Intra-operative findings: A thorough arthroscopic examination of the shoulder was performed.  The findings are: 1. Biceps tendon: mild tendinopathy with erythema intraarticularly 2. Superior labrum: erythema 3. Posterior labrum and capsule: normal 4. Inferior capsule and inferior recess: normal 5. Glenoid cartilage surface: Normal 6. Supraspinatus attachment: high-grade partial thickness bursal sided tear, essentially full-thickness 7. Posterior rotator cuff attachment: normal 8. Humeral head articular cartilage: normal 9. Rotator interval: mild synovitis 10: Subscapularis tendon: attachment intact 11. Anterior labrum: Mildly degenerative 12. IGHL: normal   OPERATIVE REPORT:    Indications for procedure:  Melissa Garrison is a 60 y.o. female with approximately 5 months of left shoulder pain.  Patient has undergone an extensive course of nonoperative management including activity modifications, medical management, and Corticosteroid injection.  Clinical exam and MRI were suggestive of rotator cuff tear, possible calcific tendinitis, biceps tendinopathy, and subacromial impingement. After discussion of risks, benefits, and alternatives to surgery, the patient elected to proceed.    Procedure in detail:   I identified Melissa Garrison in the pre-operative holding area.  I marked the operative shoulder with my initials. I reviewed the risks and benefits of the proposed surgical intervention, and the patient wished to proceed.  Anesthesia was then performed with an Exparil interscalene block.  The patient was transferred to the operative suite and placed in the beach chair position.     Appropriate IV antibiotics were administered prior to incision. The operative upper extremity was then prepped and draped in standard fashion. A time out was performed confirming the correct extremity, correct patient, and correct procedure.    I then created a standard posterior portal with an 11 blade. The glenohumeral joint was easily entered with a blunt trocar and the arthroscope introduced. The findings of diagnostic arthroscopy are described above. I debrided degenerative tissue including the synovitic tissue about the rotator interval and anterior and superior labrum. I then coagulated the inflamed synovium to obtain hemostasis and reduce the risk of post-operative swelling using an Arthrocare radiofrequency device.   I then turned my attention to the arthroscopic biceps tenodesis.  I used the Loop n Tack technique to pass a FiberTape through the biceps in a locked fashion adjacent to the biceps anchor.  A hole for a 2.9 mm Arthrex PushLock was drilled  in the bicipital groove just superior to the subscapularis tendon insertion.   The biceps tendon was then cut.  The biceps anchor was then debrided down to a stable base using the ArthroCare wand and oscillating shaver.  The FiberTape was loaded onto the PushLock anchor and impacted into place into the previously drilled hole in the bicipital groove.  This appropriately secured the biceps into the bicipital groove and took it off of tension.   Next, the arthroscope was then introduced into the subacromial space. A direct lateral portal was created with an 11-blade after spinal needle localization. An extensive subacromial bursectomy and debridement was performed using a combination of the shaver and Arthrocare wand. The entire acromial undersurface was exposed and the CA ligament was subperiosteally elevated to expose the anterior acromial hook. A burr was used to create a flat anterior and lateral aspect of the acromion, converting it from a Type 2 to a Type 1 acromion. Care was made to keep the deltoid fascia intact.   Next, I turned my attention to the rotator cuff.  I made multiple passes into the preoperative region of possible calcific tendinitis using a spinal needle.  There is no significant return of calcific deposit.  There was a high-grade partial-thickness rotator cuff tear on the bursal side.  Using a probe, I could easily push through the rotator cuff into the glenohumeral joint.  Therefore decision was made to perform rotator cuff repair.  I created an accessory posterolateral portal to assist with visualization and instrumentation.  I debrided the poor quality edges of the supraspinatus tendon.  This was an L-shaped tear of the supraspinatus with the long limb anterior.  I prepared the footprint using a burr to expose bleeding bone.    I then percutaneously placed 1 Iconix SPEED medial row anchor at the articular margin. I then shuttled all 4 strands of tape through the rotator cuff using a FirstPass suture passer spanning the midportion to the posterior extent of the  tear.  All strands were passed through an [employer] anchor.  This was placed approximately 2 cm distal to the lateral edge of the footprint anterior to the tear in order to achieve the most appropriate reduction of the rotator cuff.  There was appropriate tensioning of each suture prior to final fixation.  However, the 4.75 mm did not achieve appropriate fixation.  Therefore a 5.5 mm anchor was utilized.  There is a small dogear at the midportion of the tear and one of the FiberWire sutures was passed through this and tied appropriately to further reduce the rotator cuff.  This construct allowed for excellent reapproximation of the rotator cuff to its native footprint without undue tension.  Appropriate compression was achieved.  The repair was stable to external and internal rotation.   Fluid was evacuated from the shoulder, and the portals were closed with 3-0 Nylon. Xeroform was applied to the portals. A sterile dressing was applied, followed by a Polar Care sleeve and a SlingShot shoulder immobilizer/sling. The patient was awakened from anesthesia without difficulty and was transferred to the PACU in stable condition.    Of note, assistance from a PA was essential to performing the surgery.  PA was present for the entire surgery.  PA assisted with patient positioning, retraction, instrumentation, and wound closure. The surgery would have been more difficult and had longer operative time without PA assistance.   COMPLICATIONS: none   DISPOSITION: plan for discharge home after recovery in PACU  POSTOPERATIVE PLAN: Remain in sling (except hygiene and elbow/wrist/hand RoM exercises as instructed by PT) x 6 weeks and NWB for this time. PT to begin 3-4 days after surgery. ASA 325mg  daily x 2 weeks for DVT ppx.

## 2021-01-07 DIAGNOSIS — M25512 Pain in left shoulder: Secondary | ICD-10-CM | POA: Diagnosis not present

## 2021-01-07 DIAGNOSIS — G8929 Other chronic pain: Secondary | ICD-10-CM | POA: Diagnosis not present

## 2021-01-09 ENCOUNTER — Ambulatory Visit: Payer: BC Managed Care – PPO | Admitting: Family

## 2021-01-17 DIAGNOSIS — G8929 Other chronic pain: Secondary | ICD-10-CM | POA: Diagnosis not present

## 2021-01-17 DIAGNOSIS — M25512 Pain in left shoulder: Secondary | ICD-10-CM | POA: Diagnosis not present

## 2021-01-22 DIAGNOSIS — M25512 Pain in left shoulder: Secondary | ICD-10-CM | POA: Diagnosis not present

## 2021-01-22 DIAGNOSIS — G8929 Other chronic pain: Secondary | ICD-10-CM | POA: Diagnosis not present

## 2021-02-01 DIAGNOSIS — M25512 Pain in left shoulder: Secondary | ICD-10-CM | POA: Diagnosis not present

## 2021-02-01 DIAGNOSIS — G8929 Other chronic pain: Secondary | ICD-10-CM | POA: Diagnosis not present

## 2021-02-04 ENCOUNTER — Encounter: Payer: Self-pay | Admitting: Family

## 2021-02-06 ENCOUNTER — Other Ambulatory Visit: Payer: Self-pay | Admitting: Family

## 2021-02-06 DIAGNOSIS — I10 Essential (primary) hypertension: Secondary | ICD-10-CM

## 2021-02-07 DIAGNOSIS — G8929 Other chronic pain: Secondary | ICD-10-CM | POA: Diagnosis not present

## 2021-02-07 DIAGNOSIS — M25512 Pain in left shoulder: Secondary | ICD-10-CM | POA: Diagnosis not present

## 2021-02-13 DIAGNOSIS — M25512 Pain in left shoulder: Secondary | ICD-10-CM | POA: Diagnosis not present

## 2021-02-13 DIAGNOSIS — G8929 Other chronic pain: Secondary | ICD-10-CM | POA: Diagnosis not present

## 2021-02-18 DIAGNOSIS — G8929 Other chronic pain: Secondary | ICD-10-CM | POA: Diagnosis not present

## 2021-02-18 DIAGNOSIS — M25512 Pain in left shoulder: Secondary | ICD-10-CM | POA: Diagnosis not present

## 2021-02-20 DIAGNOSIS — M25512 Pain in left shoulder: Secondary | ICD-10-CM | POA: Diagnosis not present

## 2021-02-20 DIAGNOSIS — G8929 Other chronic pain: Secondary | ICD-10-CM | POA: Diagnosis not present

## 2021-02-26 DIAGNOSIS — M25512 Pain in left shoulder: Secondary | ICD-10-CM | POA: Diagnosis not present

## 2021-02-26 DIAGNOSIS — G8929 Other chronic pain: Secondary | ICD-10-CM | POA: Diagnosis not present

## 2021-02-28 DIAGNOSIS — M25512 Pain in left shoulder: Secondary | ICD-10-CM | POA: Diagnosis not present

## 2021-02-28 DIAGNOSIS — G8929 Other chronic pain: Secondary | ICD-10-CM | POA: Diagnosis not present

## 2021-03-28 ENCOUNTER — Other Ambulatory Visit (HOSPITAL_BASED_OUTPATIENT_CLINIC_OR_DEPARTMENT_OTHER): Payer: Self-pay | Admitting: Orthopedic Surgery

## 2021-03-28 ENCOUNTER — Other Ambulatory Visit: Payer: Self-pay | Admitting: Orthopedic Surgery

## 2021-03-28 DIAGNOSIS — M75122 Complete rotator cuff tear or rupture of left shoulder, not specified as traumatic: Secondary | ICD-10-CM

## 2021-04-10 ENCOUNTER — Ambulatory Visit
Admission: RE | Admit: 2021-04-10 | Discharge: 2021-04-10 | Disposition: A | Discharge status: Home / Self Care | Payer: No Typology Code available for payment source | Source: Ambulatory Visit | Attending: Orthopedic Surgery | Admitting: Orthopedic Surgery

## 2021-04-10 ENCOUNTER — Other Ambulatory Visit: Payer: Self-pay

## 2021-04-10 DIAGNOSIS — M75122 Complete rotator cuff tear or rupture of left shoulder, not specified as traumatic: Secondary | ICD-10-CM | POA: Diagnosis not present

## 2021-06-10 ENCOUNTER — Encounter: Payer: Self-pay | Admitting: Orthopedic Surgery

## 2021-06-10 ENCOUNTER — Other Ambulatory Visit: Payer: Self-pay | Admitting: Orthopedic Surgery

## 2021-06-10 ENCOUNTER — Other Ambulatory Visit: Payer: Self-pay

## 2021-07-02 ENCOUNTER — Ambulatory Visit
Admission: RE | Disposition: A | Discharge status: Home / Self Care | Payer: Self-pay | Source: Home / Self Care | Attending: Orthopedic Surgery

## 2021-07-02 ENCOUNTER — Ambulatory Visit
Admission: RE | Admit: 2021-07-02 | Discharge: 2021-07-02 | Disposition: A | Discharge status: Home / Self Care | Payer: Commercial Managed Care - HMO | Attending: Orthopedic Surgery | Admitting: Orthopedic Surgery

## 2021-07-02 ENCOUNTER — Ambulatory Visit: Payer: Commercial Managed Care - HMO | Admitting: Anesthesiology

## 2021-07-02 ENCOUNTER — Other Ambulatory Visit: Payer: Self-pay

## 2021-07-02 DIAGNOSIS — F17210 Nicotine dependence, cigarettes, uncomplicated: Secondary | ICD-10-CM | POA: Insufficient documentation

## 2021-07-02 DIAGNOSIS — M24612 Ankylosis, left shoulder: Secondary | ICD-10-CM | POA: Diagnosis present

## 2021-07-02 DIAGNOSIS — M7552 Bursitis of left shoulder: Secondary | ICD-10-CM | POA: Diagnosis not present

## 2021-07-02 HISTORY — PX: CLOSED MANIPULATION SHOULDER WITH STERIOD INJECTION: SHX5611

## 2021-07-02 HISTORY — PX: SHOULDER ARTHROSCOPY: SHX128

## 2021-07-02 HISTORY — PX: LYSIS OF ADHESION: SHX5961

## 2021-07-02 HISTORY — PX: CAPSULAR RELEASE: SHX6293

## 2021-07-02 LAB — GLUCOSE, CAPILLARY: Glucose-Capillary: 218 mg/dL — ABNORMAL HIGH (ref 70–99)

## 2021-07-02 SURGERY — CAPSULAR RELEASE
Anesthesia: Regional | Site: Shoulder | Laterality: Left

## 2021-07-02 MED ORDER — TRIAMCINOLONE ACETONIDE 40 MG/ML IJ SUSP
INTRAMUSCULAR | Status: DC | PRN
  Administered 2021-07-02: 80 mg via INTRA_ARTICULAR

## 2021-07-02 MED ORDER — PROPOFOL 10 MG/ML IV BOLUS
INTRAVENOUS | Status: DC | PRN
  Administered 2021-07-02: 150 mg via INTRAVENOUS

## 2021-07-02 MED ORDER — CEFAZOLIN SODIUM-DEXTROSE 2-4 GM/100ML-% IV SOLN: 2.0000 g | INTRAVENOUS | Status: DC

## 2021-07-02 MED ORDER — ASPIRIN EC 325 MG PO TBEC: 325.0000 mg | DELAYED_RELEASE_TABLET | Freq: Every day | ORAL | 0 refills | Status: AC

## 2021-07-02 MED ORDER — ACETAMINOPHEN 325 MG PO TABS: 325.0000 mg | ORAL_TABLET | ORAL | Status: DC | PRN

## 2021-07-02 MED ORDER — ONDANSETRON HCL 4 MG/2ML IJ SOLN
INTRAMUSCULAR | Status: DC | PRN
Start: 2021-07-02 — End: 2021-07-02
  Administered 2021-07-02: 4 mg via INTRAVENOUS

## 2021-07-02 MED ORDER — OXYCODONE HCL 5 MG PO TABS: 5.0000 mg | ORAL_TABLET | ORAL | 0 refills | Status: DC | PRN

## 2021-07-02 MED ORDER — FENTANYL CITRATE PF 50 MCG/ML IJ SOSY: 25.0000 ug | PREFILLED_SYRINGE | INTRAMUSCULAR | Status: DC | PRN

## 2021-07-02 MED ORDER — ONDANSETRON 4 MG PO TBDP: 4.0000 mg | ORAL_TABLET | Freq: Three times a day (TID) | ORAL | 0 refills | Status: DC | PRN

## 2021-07-02 MED ORDER — OXYCODONE HCL 5 MG/5ML PO SOLN: 5.0000 mg | Freq: Once | ORAL | Status: DC | PRN

## 2021-07-02 MED ORDER — ACETAMINOPHEN 160 MG/5ML PO SOLN: 325.0000 mg | ORAL | Status: DC | PRN

## 2021-07-02 MED ORDER — LACTATED RINGERS IV SOLN: INTRAVENOUS | Status: DC

## 2021-07-02 MED ORDER — BUPIVACAINE HCL (PF) 0.5 % IJ SOLN
INTRAMUSCULAR | Status: DC | PRN
  Administered 2021-07-02: 20 mL via PERINEURAL

## 2021-07-02 MED ORDER — MIDAZOLAM HCL 5 MG/5ML IJ SOLN
INTRAMUSCULAR | Status: DC | PRN
  Administered 2021-07-02: 2 mg via INTRAVENOUS

## 2021-07-02 MED ORDER — BUPIVACAINE LIPOSOME 1.3 % IJ SUSP
INTRAMUSCULAR | Status: DC | PRN
  Administered 2021-07-02: 20 mL via PERINEURAL

## 2021-07-02 MED ORDER — CEFAZOLIN SODIUM-DEXTROSE 2-3 GM-%(50ML) IV SOLR
INTRAVENOUS | Status: DC | PRN
  Administered 2021-07-02: 2 g via INTRAVENOUS

## 2021-07-02 MED ORDER — IBUPROFEN 800 MG PO TABS: 800.0000 mg | ORAL_TABLET | Freq: Three times a day (TID) | ORAL | 1 refills | Status: AC

## 2021-07-02 MED ORDER — LACTATED RINGERS IR SOLN
Status: DC | PRN
  Administered 2021-07-02: 6000 mL

## 2021-07-02 MED ORDER — FENTANYL CITRATE (PF) 100 MCG/2ML IJ SOLN
INTRAMUSCULAR | Status: DC | PRN
  Administered 2021-07-02: 100 ug via INTRAVENOUS

## 2021-07-02 MED ORDER — GLYCOPYRROLATE 0.2 MG/ML IJ SOLN
INTRAMUSCULAR | Status: DC | PRN
  Administered 2021-07-02: .1 mg via INTRAVENOUS

## 2021-07-02 MED ORDER — LIDOCAINE HCL (CARDIAC) PF 100 MG/5ML IV SOSY
PREFILLED_SYRINGE | INTRAVENOUS | Status: DC | PRN
  Administered 2021-07-02: 50 mg via INTRATRACHEAL

## 2021-07-02 MED ORDER — DEXAMETHASONE SODIUM PHOSPHATE 4 MG/ML IJ SOLN
INTRAMUSCULAR | Status: DC | PRN
  Administered 2021-07-02: 4 mg via INTRAVENOUS

## 2021-07-02 MED ORDER — ACETAMINOPHEN 500 MG PO TABS: 1000.0000 mg | ORAL_TABLET | Freq: Three times a day (TID) | ORAL | 2 refills | Status: AC

## 2021-07-02 MED ORDER — LIDOCAINE HCL 1 % IJ SOLN
INTRAMUSCULAR | Status: DC | PRN
  Administered 2021-07-02: 4 mL via INTRAMUSCULAR

## 2021-07-02 MED ORDER — OXYCODONE HCL 5 MG PO TABS: 5.0000 mg | ORAL_TABLET | Freq: Once | ORAL | Status: DC | PRN

## 2021-07-02 MED ORDER — LACTATED RINGERS IV SOLN
INTRAVENOUS | Status: DC | PRN
  Administered 2021-07-02: 12000 mL

## 2021-07-02 SURGICAL SUPPLY — 42 items
ADAPTER IRRIG TUBE 2 SPIKE SOL (ADAPTER) ×4 IMPLANT
ADPR TBG 2 SPK PMP STRL ASCP (ADAPTER) ×2
APL PRP STRL LF DISP 70% ISPRP (MISCELLANEOUS) ×1
BLADE FULL RADIUS 3.5 (BLADE) ×2 IMPLANT
BLADE SHAVER AGGRES 5.5  STR (CUTTER) ×1
BLADE SHAVER AGGRES 5.5 STR (CUTTER) IMPLANT
BUR BR 5.5 WIDE MOUTH (BURR) ×2 IMPLANT
BUR RADIUS 4.0X18.5 (BURR) ×2 IMPLANT
CANNULA PART THRD DISP 5.75X7 (CANNULA) ×1 IMPLANT
CHLORAPREP W/TINT 26 (MISCELLANEOUS) ×2 IMPLANT
COOLER POLAR GLACIER W/PUMP (MISCELLANEOUS) ×2 IMPLANT
COVER LIGHT HANDLE UNIVERSAL (MISCELLANEOUS) ×4 IMPLANT
DRAPE IMP U-DRAPE 54X76 (DRAPES) ×4 IMPLANT
DRAPE INCISE IOBAN 66X45 STRL (DRAPES) ×2 IMPLANT
DRAPE U-SHAPE 48X52 POLY STRL (PACKS) ×2 IMPLANT
DRSG TEGADERM 4X4.75 (GAUZE/BANDAGES/DRESSINGS) ×6 IMPLANT
ELECT REM PT RETURN 9FT ADLT (ELECTROSURGICAL) ×2
ELECTRODE REM PT RTRN 9FT ADLT (ELECTROSURGICAL) IMPLANT
GAUZE XEROFORM 1X8 LF (GAUZE/BANDAGES/DRESSINGS) ×2 IMPLANT
GLOVE SURG ENC MOIS LTX SZ7.5 (GLOVE) ×4 IMPLANT
GOWN STRL REIN 2XL XLG LVL4 (GOWN DISPOSABLE) ×2 IMPLANT
GOWN STRL REUS W/ TWL LRG LVL3 (GOWN DISPOSABLE) ×2 IMPLANT
GOWN STRL REUS W/TWL LRG LVL3 (GOWN DISPOSABLE) ×4
IV LACTATED RINGER IRRG 3000ML (IV SOLUTION) ×12
IV LR IRRIG 3000ML ARTHROMATIC (IV SOLUTION) ×6 IMPLANT
KIT STABILIZATION SHOULDER (MISCELLANEOUS) ×2 IMPLANT
KIT TURNOVER KIT A (KITS) ×2 IMPLANT
MANIFOLD NEPTUNE II (INSTRUMENTS) ×2 IMPLANT
MASK FACE SPIDER DISP (MASK) ×2 IMPLANT
MAT GRAY ABSORB FLUID 28X50 (MISCELLANEOUS) ×4 IMPLANT
PACK ARTHROSCOPY SHOULDER (MISCELLANEOUS) ×2 IMPLANT
PAD WRAPON POLAR SHDR XLG (MISCELLANEOUS) ×1 IMPLANT
SLING ARM M TX990204 (SOFTGOODS) ×1 IMPLANT
SPONGE GAUZE 2X2 8PLY STRL LF (GAUZE/BANDAGES/DRESSINGS) ×8 IMPLANT
SUT ETHILON 3-0 FS-10 30 BLK (SUTURE) ×2
SUTURE EHLN 3-0 FS-10 30 BLK (SUTURE) ×1 IMPLANT
SYR 10ML LL (SYRINGE) ×2 IMPLANT
TAPE MICROFOAM 4IN (TAPE) ×2 IMPLANT
TUBING INFLOW SET DBFLO PUMP (TUBING) ×2 IMPLANT
TUBING OUTFLOW SET DBLFO PUMP (TUBING) ×2 IMPLANT
WAND WEREWOLF FLOW 90D (MISCELLANEOUS) ×2 IMPLANT
WRAPON POLAR PAD SHDR XLG (MISCELLANEOUS) ×2

## 2021-07-02 NOTE — Progress Notes (Signed)
Assisted Veda Canning, ANMD with left, ultrasound guided, interscalene  block. Side rails up, monitors on throughout procedure. See vital signs in flow sheet. Tolerated Procedure well.

## 2021-07-02 NOTE — Transfer of Care (Signed)
Immediate Anesthesia Transfer of Care Note  Patient: Melissa Garrison  Procedure(s) Performed: Left shoulder arthroscopic lysis of adhesions, capsular release, and manipulation under anesthesia with corticosteroid injection (Left: Shoulder) CAPSULAR RELEASE (Left: Shoulder) SHOULDER INJECTION (Left: Shoulder) LYSIS OF ADHESION (Left: Shoulder)  Patient Location: PACU  Anesthesia Type: General, Regional  Level of Consciousness: awake, alert  and patient cooperative  Airway and Oxygen Therapy: Patient Spontanous Breathing and Patient connected to supplemental oxygen  Post-op Assessment: Post-op Vital signs reviewed, Patient's Cardiovascular Status Stable, Respiratory Function Stable, Patent Airway and No signs of Nausea or vomiting  Post-op Vital Signs: Reviewed and stable  Complications: No notable events documented.

## 2021-07-02 NOTE — Anesthesia Preprocedure Evaluation (Signed)
Anesthesia Evaluation    Airway Mallampati: II  TM Distance: >3 FB     Dental   Pulmonary    Pulmonary exam normal - rhonchi (-) wheezing      Cardiovascular  Rhythm:Regular Rate:Normal - Systolic murmurs and - Diastolic murmurs    Neuro/Psych    GI/Hepatic   Endo/Other    Renal/GU      Musculoskeletal   Abdominal (+) - obese,   Peds  Hematology   Anesthesia Other Findings   Reproductive/Obstetrics                             Anesthesia Physical  Anesthesia Plan  ASA: 2  Anesthesia Plan: General and Regional   Post-op Pain Management: Regional block   Induction: Intravenous  PONV Risk Score and Plan: 2 and Ondansetron, Dexamethasone and Midazolam  Airway Management Planned: LMA  Additional Equipment:   Intra-op Plan:   Post-operative Plan:   Informed Consent: I have reviewed the patients History and Physical, chart, labs and discussed the procedure including the risks, benefits and alternatives for the proposed anesthesia with the patient or authorized representative who has indicated his/her understanding and acceptance.     Dental advisory given  Plan Discussed with: CRNA  Anesthesia Plan Comments:         Anesthesia Quick Evaluation

## 2021-07-02 NOTE — Anesthesia Postprocedure Evaluation (Signed)
Anesthesia Post Note  Patient: Melissa Garrison  Procedure(s) Performed: CAPSULAR RELEASE (Left: Shoulder) LYSIS OF ADHESION (Left: Shoulder) CLOSED MANIPULATION SHOULDER WITH STEROID INJECTION (Left: Shoulder) ARTHROSCOPY SHOULDER;arthoscopic lysis of adhesions, capsular release, and manipulation under anesthesia with coricosteroid injection (Left: Shoulder)     Patient location during evaluation: PACU Anesthesia Type: Regional and General Level of consciousness: awake Pain management: pain level controlled Vital Signs Assessment: post-procedure vital signs reviewed and stable Respiratory status: respiratory function stable Cardiovascular status: stable Postop Assessment: no signs of nausea or vomiting Anesthetic complications: no   No notable events documented.  Veda Canning

## 2021-07-02 NOTE — H&P (Signed)
Paper H&P to be scanned into permanent record. H&P reviewed. No significant changes noted.  

## 2021-07-02 NOTE — Op Note (Signed)
OPERATIVE NOTE SURGERY DATE: 07/02/2021  PRE-OP DIAGNOSIS: 1. Left shoulder postoperative arthrofibrosis 2. Left subacromial bursitis   POST-OP DIAGNOSIS:  1. Left shoulder postoperative arthrofibrosis 2. Left subacromial bursitis  PROCEDURES: 1. Left shoulder capsular release, lysis of adhesions, manipulation under anesthesia  2. Left subacromial decompression without acromioplasty  3. Left glenohumeral and subacromial injections with corticosteroid  SURGEON:  Cato Mulligan, MD  ASSISTANT(S):  Durwin Glaze, PA-S   ANESTHESIA: Regional block with Exparel, Gen  TOTAL IV FLUIDS: per anesthesia record   ESTIMATED BLOOD LOSS: Minimal  DRAINS:  None.  SPECIMENS: None  IMPLANTS: None.  COMPLICATIONS: none  INDICATIONS: Melissa Garrison is a 61 y.o. female with a history of prior left arthroscopic rotator cuff repair, biceps tenodesis, subacromial decompression by me on 01/04/2021.she has had persistent postoperative stiffness despite significant physical therapy. Preoperative left shoulder examination was notable for significant motion loss and pain. Surgery was recommended for capsular releases, manipulation under anesthesia, and subacromial decompression/bursectomy with corticosteroid injections into glenohumeral joint and subacromial space. After discussion of risks, benefits, and alternatives to surgery, the patient elected to proceed.    OPERATIVE FINDINGS:  Operative Shoulder Range of Motion:  Preop  Postop  Flexion  100 140  Abduction  80 120  ER at 0  25 60  ER at 90  40 100  IR at 90  20 50  IR posterior  L2 T8    Intra-operative findings: A thorough arthroscopic examination of the shoulder was performed.  The findings are: 1. Biceps tendon: Not visualized in the joint consistent with prior biceps tenodesis 2. Superior labrum: injected with surrounding synovitis  3. Posterior labrum and capsule: Synovitic posterior capsule 4. Inferior capsule and inferior  recess: Significant synovitis and thickening of capsule 5. Glenoid cartilage surface: Normal 6. Supraspinatus attachment: Healed prior supraspinatus repair 7. Posterior rotator cuff attachment:  Normal 8. Humeral head articular cartilage: normal 9. Rotator interval: significant synovitis and thickening of capsule 10: Subscapularis tendon:  Normal with significant scar tissue surrounding tendon 11. Anterior labrum: Mildly degenerative 12. IGHL: significant synovitis around IGHL   DETAILS OF PROCEDURE: The patient was identified in the preoperative holding area. Informed consent was obtained. Operative extremity was marked. After satisfactory upper extremity regional block with Exparel was performed in the preoperative holding area, the patient was brought to the operating room and placed in a well-padded beach chair positioner.  Eyes were protected, head was affixed in neutral, and the patient was given preoperative IV antibiotics within 30 minutes of the start of the case and a surgical time-out occurred. The upper extremity was prescrubbed with Hibiclens and alcohol, prepped with ChloraPrep and draped in the usual sterile fashion.    I used the prior incisions for this procedure.  A posterior portal was made first with an 11 blade. The glenohumeral joint was easily entered with a blunt trochar and the arthroscope introduced. The findings of diagnostic arthroscopy are described above.  A standard anterior portal was made.  The joint was remarkable for significant synovitis which was chronic in the anterior, superior, posterior, and inferior aspects. This required synovectomy with a shaver, arthroscopic biter, and Arthrocare device in the affected compartments listed above.  A combination of electrocautery and oscillating shaver was used to debride the rotator interval tissue.  The posterior aspect of the coracoid was exposed.  The anterior and posterior aspects of the subscapularis were cleared of  tissue so there was no tethering.  Next, an upbiting duckbill basket was then  used to perform a capsulotomy of the rotator interval and then the MGHL and the IGHL (anterior band).  Care was taken to protect the intraarticular subscapularis.  Adhesions were cleared off the subscapularis to allow full internal and external rotation.    The arthroscope was placed into the anterior portal.  The posterior capsule was quite thickened and inflamed as well.  After synovectomy, the duckbill basket was used to perform release from the superior glenoid, down into the axillary pouch, around to the anterior band of the IGHL.  A complete 360 capsulotomy was performed in this manner.  Care was taken to protect the axillary nerve by staying on the glenoid side and making sure not to rotate the shoulder externally during the capsulotomy.  Hemostasis was achieved with the ArthroCare wand.  There was no unusual bleeding.  The instruments were removed from the joint.    The arthroscope was placed in the subacromial space. An accessory lateral portal was established. There was severe scar and chronic bursitis filling the whole subacromial space and gutters.  A complete subacromial bursectomy and debridement of the gutters was carried out with a shaver.  ArthroCare was used to control bleeding.  The rotator cuff repair was noted to be intact.  Next, manipulation under anesthesia was carried out in a gentle, controlled manner with short lever arms.  There was palpable release of adhesions. See above chart for post-manipulation improvement in range of motion.   The skin was closed with interrupted 3-0 nylon sutures. Injections of 40 mg Kenalog with 1% lidocaine and 0.5% ropivacaine were placed separately in the glenohumeral joint and subacromial space with a spinal needle.  Xeroform gauze, sterile dressings were applied. The patient was placed in a shoulder sling.  Polar Care was applied.    Instrument, sponge, and needle  counts were correct prior to closure and at the conclusion of the case.   DISPOSITION: PACU - hemodynamically stable.  POSTOPERATIVE PLAN: The patient will be discharged home. PT to begin 1 day postop for range of motion exercises.  ASA x 2 weeks for DVT ppx. Sling only for comfort and wean this week as soon as tolerated. RTC in ~1 week.

## 2021-07-02 NOTE — Anesthesia Procedure Notes (Signed)
Anesthesia Regional Block: Interscalene brachial plexus block   Pre-Anesthetic Checklist: , timeout performed,  Correct Patient, Correct Site, Correct Laterality,  Correct Procedure, Correct Position, site marked,  Risks and benefits discussed,  Surgical consent,  Pre-op evaluation,  At surgeon's request and post-op pain management  Laterality: Left  Prep: chloraprep       Needles:  Injection technique: Single-shot  Needle Type: Stimiplex     Needle Length: 10cm  Needle Gauge: 21     Additional Needles:   Procedures:,,,, ultrasound used (permanent image in chart),,    Narrative:  Start time: 07/02/2021 11:24 AM End time: 07/02/2021 11:30 AM Injection made incrementally with aspirations every 5 mL.  Performed by: Personally  Anesthesiologist: Veda Canning, MD  Additional Notes: Functioning IV was confirmed and monitors applied. Ultrasound guidance: relevant anatomy identified, needle position confirmed, local anesthetic spread visualized around nerve(s)., vascular puncture avoided.  Image printed for medical record.  Negative aspiration and no paresthesias; incremental administration of local anesthetic. The patient tolerated the procedure well. Vitals signs recorded in RN notes.

## 2021-07-02 NOTE — Discharge Instructions (Addendum)
Post-Op Instructions - Shoulder Capsular Release/Manipulation Under Anesthesia  1. Bracing: You should wear a sling for comfort only. Sling should NOT be worn longer than ~1 week.   2. Driving: No driving for 1 week post-op. Must be off narcotic pain medication.  3. Activity: No active lifting for ~2 weeks. Perform range of motion exercises DAILY at home and with physical therapy as prescribed.   4. Physical Therapy: This NEEDS to begin the day after surgery, and proceed ~6-12 weeks. This should be at least 3x/week initially.   5. Medications:  - You will be provided a prescription for narcotic pain medicine. After surgery, take 1-2 narcotic tablets every 4 hours if needed for severe pain.  - A prescription for anti-nausea medication will be provided in case the narcotic medicine causes nausea - take 1 tablet every 6 hours only if nauseated.   - Take tylenol 1000 mg (2 Extra Strength tablets or 3 regular strength) every 8 hours for pain.  May decrease or stop tylenol 5 days after surgery if you are having minimal pain. - Take ibuprofen 800mg  three times/day with food for at least two weeks every day. This will help reduce post-operative inflammation and swelling. Please call our offices if this causes any stomach/GI irritation.  - Take Aspirin 325mg /daily x 2 weeks to help prevent DVT/PE (Blood clots)   If you are taking prescription medication for anxiety, depression, insomnia, muscle spasm, chronic pain, or for attention deficit disorder, you are advised that you are at a higher risk of adverse effects with use of narcotics post-op, including narcotic addiction/dependence, depressed breathing, death. If you use non-prescribed substances: alcohol, marijuana, cocaine, heroin, methamphetamines, etc., you are at a higher risk of adverse effects with use of narcotics post-op, including narcotic addiction/dependence, depressed breathing, death. You are advised that taking > 50 morphine milligram  equivalents (MME) of narcotic pain medication per day results in twice the risk of overdose or death. For your prescription provided: oxycodone 5 mg - taking more than 6 tablets per day would result in > 50 morphine milligram equivalents (MME) of narcotic pain medication. Be advised that we will prescribe narcotics short-term, for acute post-operative pain only - 3 weeks for major operations such as shoulder repair/reconstruction surgeries.    6. Post-Op Appointment:  Your first post-op appointment will be ~1 week post-op.  7. Work or School: For most, but not all procedures, we advise staying out of work or school for at least 1 to 2 weeks in order to recover from the stress of surgery and to allow time for healing.   If you need a work or school note this can be provided.       Information for Discharge Teaching: EXPAREL (bupivacaine liposome injectable suspension)   Your surgeon or anesthesiologist gave you EXPAREL(bupivacaine) to help control your pain after surgery.  EXPAREL is a local anesthetic that provides pain relief by numbing the tissue around the surgical site. EXPAREL is designed to release pain medication over time and can control pain for up to 72 hours. Depending on how you respond to EXPAREL, you may require less pain medication during your recovery.  Possible side effects: Temporary loss of sensation or ability to move in the area where bupivacaine was injected. Nausea, vomiting, constipation Rarely, numbness and tingling in your mouth or lips, lightheadedness, or anxiety may occur. Call your doctor right away if you think you may be experiencing any of these sensations, or if you have other questions regarding possible  side effects.  Follow all other discharge instructions given to you by your surgeon or nurse. Eat a healthy diet and drink plenty of water or other fluids.  If you return to the hospital for any reason within 96 hours following the administration of  EXPAREL, it is important for health care providers to know that you have received this anesthetic. A teal colored band has been placed on your arm with the date, time and amount of EXPAREL you have received in order to alert and inform your health care providers. Please leave this armband in place for the full 96 hours following administration, and then you may remove the band.   PERIPHERAL NERVE BLOCK PATIENT INFORMATION  Your surgeon has requested a peripheral nerve block for your surgery. This anesthetic technique provides excellent post-operative pain relief for you in a safe and effective manner. It will also help reduce the risk of nausea and vomiting and allow earlier discharge from the hospital.   The block is performed under sedation with ultrasound guidance prior to your procedure. Due to the sedation, your may or may not remember the block experience. The nerve block will begin to take effect anywhere from 5 to 30 minutes after being administered. You will be transported to the operating room from your surgery after the block is completed.   At the end of surgery, when the anesthesia wears off, you will notice a few things. Your may not be able to move or feel the part of your body targeted by the nerve block. These are normal experiences, and they will disappear as the block wears off.  If you had an interscalene nerve block performed (which is common for shoulder surgery), your voice can be very hoarse and you may feel that you are not able to take as deep a breath as you did before surgery. Some patients may also notice a droopy eyelid on the affected side. These symptoms will resolve once the block wears off.  Pain control: The nerve block technique used is a single injection that can last anywhere from 1-3 days. The duration of the numbness can vary between individuals. After leaving the hospital, it is important that you begin to take your prescribed pain medication when you start to sense  the nerve block wearing off. This will help you avoid unpleasant pain at the time the nerve block wears off, which can sometimes be in the middle of the night. The block will only cover pain in the areas targeted by the nerve block so if you experience surgical pain outside of that area, please take your prescribed pain medication. Management of the numb area: After a nerve block, you cannot feel pain, pressure, or temperature in the affected area so there is an increased risk for injury. You should take extra care to protect the affected areas until sensation and movement returns. Please take caution to not come in contact with extremely hot or cold items because you will not be able to sense or protect yourself form the extremes of temperature.  You may experience some persistent numbness after the procedure by most neurological deficits resolve over time and the incidence of serious long term neurological complications attributable to peripheral nerve blocks are relatively uncommon.

## 2021-07-02 NOTE — Anesthesia Procedure Notes (Signed)
Procedure Name: LMA Insertion Date/Time: 07/02/2021 1:08 PM Performed by: Mayme Genta, CRNA Pre-anesthesia Checklist: Patient identified, Emergency Drugs available, Suction available, Timeout performed and Patient being monitored Patient Re-evaluated:Patient Re-evaluated prior to induction Oxygen Delivery Method: Circle system utilized Preoxygenation: Pre-oxygenation with 100% oxygen Induction Type: IV induction LMA: LMA inserted LMA Size: 4.0 Number of attempts: 1 Placement Confirmation: positive ETCO2 and breath sounds checked- equal and bilateral Tube secured with: Tape

## 2021-07-03 ENCOUNTER — Encounter: Payer: Self-pay | Admitting: Orthopedic Surgery

## 2021-07-03 LAB — GLUCOSE, CAPILLARY: Glucose-Capillary: 209 mg/dL — ABNORMAL HIGH (ref 70–99)

## 2021-10-18 ENCOUNTER — Other Ambulatory Visit: Payer: Self-pay

## 2021-10-18 ENCOUNTER — Ambulatory Visit: Payer: Managed Care, Other (non HMO) | Admitting: Family

## 2021-10-18 ENCOUNTER — Telehealth: Payer: Self-pay | Admitting: Family

## 2021-10-18 ENCOUNTER — Encounter: Payer: Self-pay | Admitting: Family

## 2021-10-18 VITALS — BP 130/86 | HR 97 | Temp 98.2°F | Ht 64.0 in | Wt 163.8 lb

## 2021-10-18 DIAGNOSIS — L0291 Cutaneous abscess, unspecified: Secondary | ICD-10-CM | POA: Insufficient documentation

## 2021-10-18 DIAGNOSIS — E119 Type 2 diabetes mellitus without complications: Secondary | ICD-10-CM

## 2021-10-18 DIAGNOSIS — Z1231 Encounter for screening mammogram for malignant neoplasm of breast: Secondary | ICD-10-CM | POA: Diagnosis not present

## 2021-10-18 DIAGNOSIS — I10 Essential (primary) hypertension: Secondary | ICD-10-CM

## 2021-10-18 DIAGNOSIS — E785 Hyperlipidemia, unspecified: Secondary | ICD-10-CM

## 2021-10-18 DIAGNOSIS — Z1211 Encounter for screening for malignant neoplasm of colon: Secondary | ICD-10-CM | POA: Diagnosis not present

## 2021-10-18 LAB — MICROALBUMIN / CREATININE URINE RATIO
Creatinine,U: 56.5 mg/dL
Microalb Creat Ratio: 9 mg/g (ref 0.0–30.0)
Microalb, Ur: 5.1 mg/dL — ABNORMAL HIGH (ref 0.0–1.9)

## 2021-10-18 LAB — CBC WITH DIFFERENTIAL/PLATELET
Basophils Absolute: 0 10*3/uL (ref 0.0–0.1)
Basophils Relative: 0.4 % (ref 0.0–3.0)
Eosinophils Absolute: 0.1 10*3/uL (ref 0.0–0.7)
Eosinophils Relative: 0.8 % (ref 0.0–5.0)
HCT: 37.6 % (ref 36.0–46.0)
Hemoglobin: 12.8 g/dL (ref 12.0–15.0)
Lymphocytes Relative: 35.8 % (ref 12.0–46.0)
Lymphs Abs: 2.7 10*3/uL (ref 0.7–4.0)
MCHC: 34.2 g/dL (ref 30.0–36.0)
MCV: 86.7 fl (ref 78.0–100.0)
Monocytes Absolute: 0.5 10*3/uL (ref 0.1–1.0)
Monocytes Relative: 6.2 % (ref 3.0–12.0)
Neutro Abs: 4.3 10*3/uL (ref 1.4–7.7)
Neutrophils Relative %: 56.8 % (ref 43.0–77.0)
Platelets: 265 10*3/uL (ref 150.0–400.0)
RBC: 4.33 Mil/uL (ref 3.87–5.11)
RDW: 14.2 % (ref 11.5–15.5)
WBC: 7.6 10*3/uL (ref 4.0–10.5)

## 2021-10-18 LAB — COMPREHENSIVE METABOLIC PANEL
ALT: 14 U/L (ref 0–35)
AST: 25 U/L (ref 0–37)
Albumin: 4.4 g/dL (ref 3.5–5.2)
Alkaline Phosphatase: 55 U/L (ref 39–117)
BUN: 7 mg/dL (ref 6–23)
CO2: 24 mEq/L (ref 19–32)
Calcium: 9.9 mg/dL (ref 8.4–10.5)
Chloride: 96 mEq/L (ref 96–112)
Creatinine, Ser: 0.68 mg/dL (ref 0.40–1.20)
GFR: 95.65 mL/min (ref >60.00)
Glucose, Bld: 370 mg/dL — ABNORMAL HIGH (ref 70–99)
Potassium: 4 mEq/L (ref 3.5–5.1)
Sodium: 132 mEq/L — ABNORMAL LOW (ref 135–145)
Total Bilirubin: 0.4 mg/dL (ref 0.2–1.2)
Total Protein: 8 g/dL (ref 6.0–8.3)

## 2021-10-18 LAB — TSH: TSH: 1.73 u[IU]/mL (ref 0.35–5.50)

## 2021-10-18 LAB — POCT GLYCOSYLATED HEMOGLOBIN (HGB A1C): Hemoglobin A1C: 11.3 % — AB (ref 4.0–5.6)

## 2021-10-18 LAB — VITAMIN D 25 HYDROXY (VIT D DEFICIENCY, FRACTURES): VITD: 13.67 ng/mL — ABNORMAL LOW (ref 30.00–100.00)

## 2021-10-18 MED ORDER — DOXYCYCLINE HYCLATE 100 MG PO TABS: 100.0000 mg | ORAL_TABLET | Freq: Two times a day (BID) | ORAL | 0 refills | Status: DC

## 2021-10-18 MED ORDER — ROSUVASTATIN CALCIUM 10 MG PO TABS: 10.0000 mg | ORAL_TABLET | ORAL | 0 refills | Status: DC

## 2021-10-18 MED ORDER — ONETOUCH DELICA LANCETS 33G MISC: 3 refills | Status: DC

## 2021-10-18 MED ORDER — CLOTRIMAZOLE 1 % EX OINT: 1.0000 "application " | TOPICAL_OINTMENT | Freq: Two times a day (BID) | CUTANEOUS | 1 refills | Status: DC

## 2021-10-18 MED ORDER — METFORMIN HCL ER 500 MG PO TB24: 1000.0000 mg | ORAL_TABLET | Freq: Two times a day (BID) | ORAL | 3 refills | Status: DC

## 2021-10-18 MED ORDER — ONETOUCH VERIO VI STRP: ORAL_STRIP | 3 refills | Status: DC

## 2021-10-18 MED ORDER — LISINOPRIL 20 MG PO TABS: 20.0000 mg | ORAL_TABLET | Freq: Every day | ORAL | 2 refills | Status: DC

## 2021-10-18 MED ORDER — AMLODIPINE BESYLATE 5 MG PO TABS: 5.0000 mg | ORAL_TABLET | Freq: Every day | ORAL | 3 refills | Status: DC

## 2021-10-18 NOTE — Assessment & Plan Note (Signed)
No systemic features. Advised warm compresses and to start doxycycline, topical clotrimazole to cover for suspect candida intertrigo. Close follow 10/28/21 to recheck area.  ?

## 2021-10-18 NOTE — Telephone Encounter (Signed)
Melissa Garrison From [facility] called in stated that medication (Clotrimazole 1 % OINT) is no longer available... Melissa Garrison stated that they have a cream that they will provide the pt...  ?

## 2021-10-18 NOTE — Assessment & Plan Note (Addendum)
Lab Results  ?Component Value Date  ? HGBA1C 11.3 (A) 10/18/2021  ? ? ? ?Severely uncontrolled. Resume metformin '500mg'$  bid and then increase to '1000mg'$  in a couple of days. Discussed starting another medication however we deferred as patient will stop completely drinking soda. Provided teaching on glucometer and she will also check FBG and return to discuss blood sugars. Close follow up 10/28/21.  ?

## 2021-10-18 NOTE — Assessment & Plan Note (Signed)
Slightly elevated today. Resume amlodipine '5mg'$ , refilled. Continue lisinopril '20mg'$  ?

## 2021-10-18 NOTE — Progress Notes (Signed)
? ?Subjective:  ? ? Patient ID: Melissa Garrison, female    DOB: 1960/11/05, 61 y.o.   MRN: [id number] ? ?CC: Melissa Garrison is a 61 y.o. female who presents today for follow up.  ? ?HPI: Complains of painful 'boil' left perineum area x one week, worsening ? ?Nondraining. No h/o MRSA. No vaginal discharge or concern for yeast infection. Area surrounding is itching.  ?No dysuria. Skin intact.  ?She has no new sexual partners.  ? ?HTN- She has ran out of amlodipine '5mg'$  . She is taking lisinopril '20mg'$  ? ?DM- she ran out of metformin months ago. ?Endorses dietary indiscretion with soda daily ?Denies blurry vision, polyuria, polyphagia. ?She doesn't have a glucometer ? ?Reports myalgia with crestor and she has stopped medication ?HISTORY:  ?Past Medical History:  ?Diagnosis Date  ? Allergic rhinitis 07/11/2011  ? Diabetes mellitus without complication ([place])   ? Hepatitis C   ? Dx: 2011 Dr Jerilee Hoh STATES SHE WENT THROUGH TREATMENT AND STATES HEP C IS NOW RESOLVED   ? Hepatitis C, chronic ([place]) 07/11/2011  ? HTN (hypertension)   ? Hypertension 07/11/2011  ? IV drug abuse ([place])   ? HISTORY - greater than 10 years  ? Vitamin D deficiency   ? ?Past Surgical History:  ?Procedure Laterality Date  ? BREAST BIOPSY Left 12/05/2015  ? stereotactic biopsy- benign  ? CAPSULAR RELEASE Left 07/02/2021  ? Procedure: CAPSULAR RELEASE;  Surgeon: Leim Fabry, MD;  Location: [place];  Service: Orthopedics;  Laterality: Left;  ? CHOLECYSTECTOMY  2011  ? CLOSED MANIPULATION SHOULDER WITH STERIOD INJECTION Left 07/02/2021  ? Procedure: CLOSED MANIPULATION SHOULDER WITH STEROID INJECTION;  Surgeon: Leim Fabry, MD;  Location: [place];  Service: Orthopedics;  Laterality: Left;  ? COLONOSCOPY  2017  ? HUMERUS SURGERY Left   ? due to fracture  ? HYDRADENITIS EXCISION Right 03/20/2017  ? Procedure: EXCISION HIDRADENITIS AXILLA;  Surgeon: Robert Bellow, MD;  Location: [facility];  Service: General;  Laterality: Right;   ? LAPAROSCOPIC HYSTERECTOMY    ? for noncancerous reason for fibroids. Still has ovaries. no cervix on exam 10/10/20  ? LYSIS OF ADHESION Left 07/02/2021  ? Procedure: LYSIS OF ADHESION;  Surgeon: Leim Fabry, MD;  Location: [place];  Service: Orthopedics;  Laterality: Left;  ? SHOULDER ARTHROSCOPY Left 07/02/2021  ? Procedure: ARTHROSCOPY SHOULDER;arthoscopic lysis of adhesions, capsular release, and manipulation under anesthesia with coricosteroid injection;  Surgeon: Leim Fabry, MD;  Location: [place];  Service: Orthopedics;  Laterality: Left;  ? SHOULDER ARTHROSCOPY WITH SUBACROMIAL DECOMPRESSION AND OPEN ROTATOR C Left 01/04/2021  ? Procedure: Left shoulder arthroscopic rotator cuff repair  and subacromial decompression, and biceps tenodesis;  Surgeon: Leim Fabry, MD;  Location: [facility];  Service: Orthopedics;  Laterality: Left;  ? ?Family History  ?Problem Relation Age of Onset  ? Hypertension Mother   ? Diabetes Father   ? Hypertension Brother   ? Breast cancer Neg Hx   ? Colon cancer Neg Hx   ? ? ?Allergies: Metformin and related ?Current Outpatient Medications on File Prior to Visit  ?Medication Sig Dispense Refill  ? acetaminophen (TYLENOL) 500 MG tablet Take 2 tablets (1,000 mg total) by mouth every 8 (eight) hours. 90 tablet 2  ? ondansetron (ZOFRAN-ODT) 4 MG disintegrating tablet Take 1 tablet (4 mg total) by mouth every 8 (eight) hours as needed for nausea or vomiting. 20 tablet 0  ? ?No current facility-administered medications on file prior to visit.  ? ? ?  Social History  ? ?Tobacco Use  ? Smoking status: Every Day  ?  Packs/day: 0.50  ?  Years: 10.00  ?  Pack years: 5.00  ?  Types: Cigarettes  ? Smokeless tobacco: Never  ? Tobacco comments:  ?  4/day  ?Vaping Use  ? Vaping Use: Never used  ?Substance Use Topics  ? Alcohol use: Yes  ?  Comment: 2 beers per day.  ? Drug use: No  ?  Comment: h/o iv drug use years ago  ? ? ?Review of Systems  ?Constitutional:  Negative for  chills and fever.  ?Respiratory:  Negative for cough.   ?Cardiovascular:  Negative for chest pain and palpitations.  ?Gastrointestinal:  Negative for nausea and vomiting.  ?Genitourinary:  Negative for vaginal discharge and vaginal pain.  ?Skin:  Negative for rash.  ?   ?Objective:  ?  ?BP 130/86 (BP Location: Left Arm, Patient Position: Sitting, Cuff Size: Small)   Pulse 97   Temp 98.2 ?F (36.8 ?C) (Oral)   Ht '5\' 4"'$  (1.626 m)   Wt 163 lb 12.8 oz (74.3 kg)   SpO2 99%   BMI 28.12 kg/m?  ?BP Readings from Last 3 Encounters:  ?10/18/21 130/86  ?07/02/21 137/69  ?01/04/21 (!) 149/76  ? ?Wt Readings from Last 3 Encounters:  ?10/18/21 163 lb 12.8 oz (74.3 kg)  ?07/02/21 169 lb (76.7 kg)  ?01/01/21 170 lb (77.1 kg)  ? ? ?Physical Exam ?Vitals reviewed.  ?Constitutional:   ?   Appearance: She is well-developed.  ?Eyes:  ?   Conjunctiva/sclera: Conjunctivae normal.  ?Cardiovascular:  ?   Rate and Rhythm: Normal rate and regular rhythm.  ?   Pulses: Normal pulses.  ?   Heart sounds: Normal heart sounds.  ?Pulmonary:  ?   Effort: Pulmonary effort is normal.  ?   Breath sounds: Normal breath sounds. No wheezing, rhonchi or rales.  ?Genitourinary: ? ? ?   Comments: Left sided nonfluctuant abscess noted approx 3-4 cm.  No red streaks. Tender to palpation. Skin intact. No rash appreciated of the perineum.  ?Skin: ?   General: Skin is warm and dry.  ?Neurological:  ?   Mental Status: She is alert.  ?Psychiatric:     ?   Speech: Speech normal.     ?   Behavior: Behavior normal.     ?   Thought Content: Thought content normal.  ? ? ?   ?Assessment & Plan:  ? ?Problem List Items Addressed This Visit   ? ?  ? Cardiovascular and Mediastinum  ? Hypertension  ?  Slightly elevated today. Resume amlodipine '5mg'$ , refilled. Continue lisinopril '20mg'$  ? ?  ?  ? Relevant Medications  ? amLODipine (NORVASC) 5 MG tablet  ? lisinopril (ZESTRIL) 20 MG tablet  ? rosuvastatin (CRESTOR) 10 MG tablet  ?  ? Endocrine  ? Diabetes mellitus without  complication ([place]) - Primary  ?  Lab Results  ?Component Value Date  ? HGBA1C 11.3 (A) 10/18/2021  ? ? ?Severely uncontrolled. Resume metformin '500mg'$  bid and then increase to '1000mg'$  in a couple of days. Discussed starting another medication however we deferred as patient will stop completely drinking soda. Provided teaching on glucometer and she will also check FBG and return to discuss blood sugars. Close follow up 10/28/21.  ?  ?  ? Relevant Medications  ? metFORMIN (GLUCOPHAGE XR) 500 MG 24 hr tablet  ? lisinopril (ZESTRIL) 20 MG tablet  ? rosuvastatin (CRESTOR) 10 MG tablet  ?  Other Relevant Orders  ? POCT HgB A1C (Completed)  ? CBC with Differential/Platelet  ? Comprehensive metabolic panel  ? Microalbumin / creatinine urine ratio  ? VITAMIN D 25 Hydroxy (Vit-D Deficiency, Fractures)  ? TSH  ?  ? Other  ? Abscess  ?  No systemic features. Advised warm compresses and to start doxycycline, topical clotrimazole to cover for suspect candida intertrigo. Close follow 10/28/21 to recheck area.  ? ?  ?  ? Relevant Medications  ? doxycycline (VIBRA-TABS) 100 MG tablet  ? Clotrimazole 1 % OINT  ? HLD (hyperlipidemia)  ?  Advised to resume crestor '10mg'$  once per week to reduce ASCVD risk. Advised to gradually increase to avoid myalgia. Close follow up.  ? ?  ?  ? Relevant Medications  ? amLODipine (NORVASC) 5 MG tablet  ? lisinopril (ZESTRIL) 20 MG tablet  ? rosuvastatin (CRESTOR) 10 MG tablet  ? ?Other Visit Diagnoses   ? ? Screen for colon cancer      ? Relevant Orders  ? Ambulatory referral to Gastroenterology  ? Encounter for screening mammogram for malignant neoplasm of breast      ? Relevant Orders  ? MM 3D SCREEN BREAST BILATERAL  ? Essential hypertension      ? Relevant Medications  ? amLODipine (NORVASC) 5 MG tablet  ? lisinopril (ZESTRIL) 20 MG tablet  ? rosuvastatin (CRESTOR) 10 MG tablet  ? ?  ? ? ? ?I have discontinued Kenniya Bartko's oxyCODONE. I have also changed her metFORMIN, lisinopril, and rosuvastatin.  Additionally, I am having her start on doxycycline and Clotrimazole. Lastly, I am having her maintain her ondansetron, acetaminophen, and amLODipine. ? ? ?Meds ordered this encounter  ?Medications  ? metFORMIN (GLU

## 2021-10-18 NOTE — Assessment & Plan Note (Signed)
Advised to resume crestor '10mg'$  once per week to reduce ASCVD risk. Advised to gradually increase to avoid myalgia. Close follow up.  ?

## 2021-10-18 NOTE — Telephone Encounter (Signed)
noted 

## 2021-10-18 NOTE — Progress Notes (Signed)
poct

## 2021-10-18 NOTE — Patient Instructions (Addendum)
Start doxycycline ( antibiotic) for boil. Avoid sun antibiotic can make it easier to develop sunburn ? ?Ensure to take probiotics while on antibiotics and also for 2 weeks after completion. This can either be by eating yogurt daily or taking a probiotic supplement over the counter such as Culturelle.It is important to re-colonize the gut with good bacteria and also to prevent any diarrheal infections associated with antibiotic use.  ? ?Surround the area, you may use clotrimazole EXTERNAL USE ONLY to cover for skin fungal infection ? ?Warm compresses ?Please let me know how you are doing ? ?Start crestor '10mg'$  once per week to avoid muscle aches.  ? ?Resume metformin but I want to titrate up. ? ?Start metformin '500mg'$  twice per day for 2-3 days and then increase to '1000mg'$  ( two tablets) twice per day ? ?Resume lisinopril '20mg'$  and amlodipine '5mg'$  for blood pressure ? ?Please avoid all soda.  ? ? ?Goal of fasting blood sugar is taken in the morning before you eat or drink anything . Goal is between 70-120. If in this range, we are reaching our target a1c ( goal 6.5%)  ? ?Please check fasting blood sugar in the morning time once or twice a week.  You may also check if you feel like you are having a low episode or particularly high episode of blood sugar. ? ? ?Call [facility] clinic if: BG < 70 or > 300. ? ? If you have any symptoms of low blood sugar ( sweating, shakiness, lightheaded, dizzy) that you notify me. If you have a low, please drink a glass of orange juice and recheck blood sugar every 5 minutes until you don?t feel symptomatic AND blood sugar is above 80.  ? ?

## 2021-10-21 ENCOUNTER — Telehealth: Payer: Self-pay | Admitting: Family

## 2021-10-21 ENCOUNTER — Other Ambulatory Visit: Payer: Self-pay

## 2021-10-21 DIAGNOSIS — E119 Type 2 diabetes mellitus without complications: Secondary | ICD-10-CM

## 2021-10-21 MED ORDER — METFORMIN HCL ER 500 MG PO TB24: 1000.0000 mg | ORAL_TABLET | Freq: Two times a day (BID) | ORAL | 3 refills | Status: DC

## 2021-10-21 NOTE — Telephone Encounter (Signed)
Pt need refill on metformin sent to [place] ?

## 2021-10-21 NOTE — Telephone Encounter (Signed)
Refill has been placed ?

## 2021-10-22 ENCOUNTER — Telehealth: Payer: Self-pay

## 2021-10-22 ENCOUNTER — Other Ambulatory Visit: Payer: Self-pay

## 2021-10-22 DIAGNOSIS — E119 Type 2 diabetes mellitus without complications: Secondary | ICD-10-CM

## 2021-10-22 MED ORDER — METFORMIN HCL ER 500 MG PO TB24: 1000.0000 mg | ORAL_TABLET | Freq: Two times a day (BID) | ORAL | 3 refills | Status: DC

## 2021-10-22 NOTE — Telephone Encounter (Signed)
Patient called and went to [facility] to pick up her medication. Metformin was sent to wrong pharmacy. Please send to [facility]. ?

## 2021-10-22 NOTE — Telephone Encounter (Signed)
Went into patient chart and corrected pharmacy and called patient to inform her that medication was sent to [facility] and they would let her know when ready to be picked up ?

## 2021-10-22 NOTE — Telephone Encounter (Signed)
Spoke to patient and informed her that medication was ready for pickup now. ?

## 2021-10-28 ENCOUNTER — Encounter: Payer: Self-pay | Admitting: Family

## 2021-10-28 ENCOUNTER — Ambulatory Visit (INDEPENDENT_AMBULATORY_CARE_PROVIDER_SITE_OTHER): Payer: Commercial Managed Care - HMO | Admitting: Family

## 2021-10-28 DIAGNOSIS — I10 Essential (primary) hypertension: Secondary | ICD-10-CM | POA: Diagnosis not present

## 2021-10-28 DIAGNOSIS — E119 Type 2 diabetes mellitus without complications: Secondary | ICD-10-CM | POA: Diagnosis not present

## 2021-10-28 DIAGNOSIS — E785 Hyperlipidemia, unspecified: Secondary | ICD-10-CM

## 2021-10-28 NOTE — Assessment & Plan Note (Signed)
Chronic, stable.  Continue amlodipine 5 mg, lisinopril '20mg'$  ?

## 2021-10-28 NOTE — Patient Instructions (Addendum)
Increase metformin to '1000mg'$  ( two tablets together ) in the morning and '1000mg'$  in the evening.  ? ?If you have blood sugar fasting in the 300s call the office as I would like to add on Ozempic.  ? ?This is  Dr. Lupita Dawn  example of a  "Low GI"  Diet:  It will allow you to lose 4 to 8  lbs  per month if you follow it carefully.  Your goal with exercise is a minimum of 30 minutes of aerobic exercise 5 days per week (Walking does not count once it becomes easy!)  ? ? All of the foods can be found at grocery stores and in bulk at [employer].  The Atkins protein bars and shakes are available in more varieties at Target, [employer] and [employer].   ? ? ?7 AM Breakfast:  Choose from the following: ? Low carbohydrate Protein  Shakes (I recommend the  Premier Protein chocolate shakes,  EAS AdvantEdge "Carb Control" shakes  Or the Atkins shakes all are under 3 net carbs)  ?   a scrambled egg/bacon/cheese burrito made with [place]'s "carb balance" whole wheat tortilla  (about 10 net carbs ) ? Jimmy Deans sells Physicist, medical (basically a quiche without the pastry crust) that is eaten cold and very convenient way to get your eggs.  8 carbs) ? If you make your own protein shakes, avoid bananas and pineapple,  And use low carb greek yogurt or original /unsweetened almond or soy milk  ? ? Avoid cereal and bananas, oatmeal and cream of wheat and grits. They are loaded with carbohydrates! ? ? ?10 AM: high protein snack: ? Protein bar by Atkins (the snack size, under 200 cal, usually < 6 net carbs).   ? A stick of cheese:  Around 1 carb,  100 cal    ? Dannon Light n Fit Greek Yogurt  (80 cal, 8 carbs)  ?Other so called "protein bars" and Greek yogurts tend to be loaded with carbohydrates.  Remember, in food advertising, the word "energy" is synonymous for " carbohydrate." ? ?Lunch:  ? A Sandwich using the bread choices listed, Can use any  Eggs,  lunchmeat, grilled meat or canned tuna), avocado, regular mayo/mustard  and  cheese. ? A Salad using blue cheese, ranch,  Goddess or vinagrette,  Avoid taco shells, croutons or "confetti" and no "candied nuts" but regular nuts OK.  ? No pretzels, nabs  or chips.  Pickles and miniature sweet peppers are a good low carb alternative that provide a "crunch" ? The bread is the only source of carbohydrate in a sandwich and  can be decreased by trying some of the attached alternatives to traditional loaf bread ?  ?Avoid "Low fat dressings, as well as [place] dressings They are loaded with sugar! ? ? ?3 PM/ Mid day  Snack: ? Consider  1 ounce of  almonds, walnuts, pistachios, pecans, peanuts,  Macadamia nuts or a nut medley. ? Avoid "granola and granola bars " ? Mixed nuts are ok in moderation as long as there are no raisins,  cranberries or dried fruit.  ? KIND bars are OK if you get the low glycemic index variety  ? Try the prosciutto/mozzarella cheese sticks by [employer]  In deli /backery section   High protein  ?  ?  ?6 PM  Dinner:   ?  Meat/fowl/fish with a green salad, and either broccoli, cauliflower, green beans, spinach, brussel sprouts or  Lima beans.  DO NOT BREAD THE PROTEIN!!   ?   There is a low carb pasta by Dreamfield's that is acceptable and tastes great: only 5 digestible carbs/serving.( All grocery stores but BJs carry it ) ?Several ready made meals are available low carb:  ? Try Michel Angelo's chicken piccata or chicken or eggplant parm over low carb pasta.(Lowes and BJs)  ? Marjory Lies Sanchez's "Carnitas" (pulled pork, no sauce,  0 carbs) or his beef pot roast to make a dinner burrito (at [employer]) ? Pesto over low carb pasta (bj's sells a good quality pesto in the center refrigerated section of the deli  ? Try satueeing  Cheral Marker with mushroooms as a good side  ? [employer] Giant makes a mashed cauliflower that tastes like mashed potatoes ? ?Whole wheat pasta is still full of digestible carbs and  Not as low in glycemic index as Dreamfield's.   ?Brown rice is still rice,   So skip the rice and noodles if you eat [place] or [place] (or at least limit to 1/2 cup) ? ?9 PM snack :  ? Breyer's "low carb" fudgsicle or  ice cream bar (Carb Smart line), or  Weight Watcher's ice cream bar , or another "no sugar added" ice cream; ? a serving of fresh berries/cherries with whipped cream  ? Cheese or DANNON'S LlGHT N FIT GREEK YOGURT ? 8 ounces of [place] unsweetened almond/cococunut milk   ? Treat yourself to a parfait made with whipped cream blueberiies, walnuts and vanilla greek yogurt ? ?Avoid bananas, pineapple, grapes  and watermelon on a regular basis because they are high in sugar.  THINK OF THEM AS DESSERT ? ?Remember that snack Substitutions should be less than 10 NET carbs per serving and meals < 20 carbs. Remember to subtract fiber grams to get the "net carbs." ? ? ? ? ?

## 2021-10-28 NOTE — Assessment & Plan Note (Signed)
Advise increase Crestor '10mg'$  to twice per week.  ?

## 2021-10-28 NOTE — Progress Notes (Signed)
? ?Subjective:  ? ? Patient ID: Melissa Garrison, female    DOB: 08-17-1960, 61 y.o.   MRN: [id number] ? ?CC: Melissa Garrison is a 61 y.o. female who presents today for follow up.  ? ?HPI: Feels well today.  No new complaints ? ? ? ?HTN-she is compliant with amlodipine 5 mg, lisinopril '20mg'$ . No cp.  ?Diabetes-she has resumed metformin 500 mg twice daily. She has stopped drinking soda, decreased carbohydrate. FBG 251, 217, 182, 215.  ?She is no longer drinking soda ?No personal or family h/o thyroid cancer. ?She has completed doxycycline with resolution of abscess ? ?HLD- she is compliant with crestor '10mg'$  once per week without myalgia. ?HISTORY:  ?Past Medical History:  ?Diagnosis Date  ? Allergic rhinitis 07/11/2011  ? Diabetes mellitus without complication ([place])   ? Hepatitis C   ? Dx: 2011 Dr Jerilee Hoh STATES SHE WENT THROUGH TREATMENT AND STATES HEP C IS NOW RESOLVED   ? Hepatitis C, chronic ([place]) 07/11/2011  ? HTN (hypertension)   ? Hypertension 07/11/2011  ? IV drug abuse ([place])   ? HISTORY - greater than 10 years  ? Vitamin D deficiency   ? ?Past Surgical History:  ?Procedure Laterality Date  ? BREAST BIOPSY Left 12/05/2015  ? stereotactic biopsy- benign  ? CAPSULAR RELEASE Left 07/02/2021  ? Procedure: CAPSULAR RELEASE;  Surgeon: Leim Fabry, MD;  Location: [place];  Service: Orthopedics;  Laterality: Left;  ? CHOLECYSTECTOMY  2011  ? CLOSED MANIPULATION SHOULDER WITH STERIOD INJECTION Left 07/02/2021  ? Procedure: CLOSED MANIPULATION SHOULDER WITH STEROID INJECTION;  Surgeon: Leim Fabry, MD;  Location: [place];  Service: Orthopedics;  Laterality: Left;  ? COLONOSCOPY  2017  ? HUMERUS SURGERY Left   ? due to fracture  ? HYDRADENITIS EXCISION Right 03/20/2017  ? Procedure: EXCISION HIDRADENITIS AXILLA;  Surgeon: Robert Bellow, MD;  Location: [facility];  Service: General;  Laterality: Right;  ? LAPAROSCOPIC HYSTERECTOMY    ? for noncancerous reason for fibroids. Still has ovaries.  no cervix on exam 10/10/20  ? LYSIS OF ADHESION Left 07/02/2021  ? Procedure: LYSIS OF ADHESION;  Surgeon: Leim Fabry, MD;  Location: [place];  Service: Orthopedics;  Laterality: Left;  ? SHOULDER ARTHROSCOPY Left 07/02/2021  ? Procedure: ARTHROSCOPY SHOULDER;arthoscopic lysis of adhesions, capsular release, and manipulation under anesthesia with coricosteroid injection;  Surgeon: Leim Fabry, MD;  Location: [place];  Service: Orthopedics;  Laterality: Left;  ? SHOULDER ARTHROSCOPY WITH SUBACROMIAL DECOMPRESSION AND OPEN ROTATOR C Left 01/04/2021  ? Procedure: Left shoulder arthroscopic rotator cuff repair  and subacromial decompression, and biceps tenodesis;  Surgeon: Leim Fabry, MD;  Location: [facility];  Service: Orthopedics;  Laterality: Left;  ? ?Family History  ?Problem Relation Age of Onset  ? Hypertension Mother   ? Diabetes Father   ? Hypertension Brother   ? Breast cancer Neg Hx   ? Colon cancer Neg Hx   ? Thyroid cancer Neg Hx   ? ? ?Allergies: Metformin and related ?Current Outpatient Medications on File Prior to Visit  ?Medication Sig Dispense Refill  ? acetaminophen (TYLENOL) 500 MG tablet Take 2 tablets (1,000 mg total) by mouth every 8 (eight) hours. 90 tablet 2  ? amLODipine (NORVASC) 5 MG tablet Take 1 tablet (5 mg total) by mouth daily. 90 tablet 3  ? Clotrimazole 1 % OINT Apply 1 application. topically 2 (two) times daily. 56.7 g 1  ? glucose blood (ONETOUCH VERIO) test strip Used to check blood sugars  once a day. 100 each 3  ? lisinopril (ZESTRIL) 20 MG tablet Take 1 tablet (20 mg total) by mouth daily. 90 tablet 2  ? metFORMIN (GLUCOPHAGE XR) 500 MG 24 hr tablet Take 2 tablets (1,000 mg total) by mouth in the morning and at bedtime. 120 tablet 3  ? ondansetron (ZOFRAN-ODT) 4 MG disintegrating tablet Take 1 tablet (4 mg total) by mouth every 8 (eight) hours as needed for nausea or vomiting. 20 tablet 0  ? OneTouch Delica Lancets 51O MISC Used to check blood sugar once  daily. 100 each 3  ? rosuvastatin (CRESTOR) 10 MG tablet Take 1 tablet (10 mg total) by mouth once a week. 90 tablet 0  ? ?No current facility-administered medications on file prior to visit.  ? ? ?Social History  ? ?Tobacco Use  ? Smoking status: Every Day  ?  Packs/day: 0.50  ?  Years: 10.00  ?  Pack years: 5.00  ?  Types: Cigarettes  ? Smokeless tobacco: Never  ? Tobacco comments:  ?  4/day  ?Vaping Use  ? Vaping Use: Never used  ?Substance Use Topics  ? Alcohol use: Yes  ?  Comment: 2 beers per day.  ? Drug use: No  ?  Comment: h/o iv drug use years ago  ? ? ?Review of Systems  ?Constitutional:  Negative for chills and fever.  ?Respiratory:  Negative for cough.   ?Cardiovascular:  Negative for chest pain and palpitations.  ?Gastrointestinal:  Negative for nausea and vomiting.  ?   ?Objective:  ?  ?BP 132/82 (BP Location: Left Arm, Patient Position: Sitting, Cuff Size: Normal)   Pulse 90   Temp 98.1 ?F (36.7 ?C) (Oral)   Ht '5\' 4"'$  (1.626 m)   Wt 160 lb (72.6 kg)   SpO2 99%   BMI 27.46 kg/m?  ?BP Readings from Last 3 Encounters:  ?10/28/21 132/82  ?10/18/21 130/86  ?07/02/21 137/69  ? ?Wt Readings from Last 3 Encounters:  ?10/28/21 160 lb (72.6 kg)  ?10/18/21 163 lb 12.8 oz (74.3 kg)  ?07/02/21 169 lb (76.7 kg)  ? ? ?Physical Exam ?Vitals reviewed.  ?Constitutional:   ?   Appearance: She is well-developed.  ?Eyes:  ?   Conjunctiva/sclera: Conjunctivae normal.  ?Cardiovascular:  ?   Rate and Rhythm: Normal rate and regular rhythm.  ?   Pulses: Normal pulses.  ?   Heart sounds: Normal heart sounds.  ?Pulmonary:  ?   Effort: Pulmonary effort is normal.  ?   Breath sounds: Normal breath sounds. No wheezing, rhonchi or rales.  ?Skin: ?   General: Skin is warm and dry.  ?Neurological:  ?   Mental Status: She is alert.  ?Psychiatric:     ?   Speech: Speech normal.     ?   Behavior: Behavior normal.     ?   Thought Content: Thought content normal.  ? ? ?   ?Assessment & Plan:  ? ?Problem List Items Addressed This  Visit   ? ?  ? Cardiovascular and Mediastinum  ? Hypertension  ?  Chronic, stable.  Continue amlodipine 5 mg, lisinopril '20mg'$  ? ?  ?  ?  ? Endocrine  ? Diabetes mellitus without complication ([place])  ?  Uncontrolled.  Advised to increase metformin to 1000 mg twice daily.  Advised her to call me if blood sugars in the 300s or do not improve with increase metformin and approach 120.  we already discussed starting Ozempic.  She has no  personal or family history of thyroid cancer.  Counseled on low glycemic diet ? ?  ?  ?  ? Other  ? HLD (hyperlipidemia)  ?  Advise increase Crestor '10mg'$  to twice per week.  ? ?  ?  ? ? ? ?I have discontinued Yenifer Faulconer's doxycycline. I am also having her maintain her ondansetron, acetaminophen, Clotrimazole, amLODipine, lisinopril, rosuvastatin, OneTouch Delica Lancets 79Y, OneTouch Verio, and metFORMIN. ? ? ?No orders of the defined types were placed in this encounter. ? ? ?Return precautions given.  ? ?Risks, benefits, and alternatives of the medications and treatment plan prescribed today were discussed, and patient expressed understanding.  ? ?Education regarding symptom management and diagnosis given to patient on AVS. ? ?Continue to follow with Burnard Hawthorne, FNP for routine health maintenance.  ? ?[name] and I agreed with plan.  ? ?Mable Paris, FNP ? ? ?

## 2021-10-28 NOTE — Assessment & Plan Note (Addendum)
Uncontrolled.  Advised to increase metformin to 1000 mg twice daily.  Advised her to call me if blood sugars in the 300s or do not improve with increase metformin and approach 120.  we already discussed starting Ozempic.  She has no personal or family history of thyroid cancer.  Counseled on low glycemic diet ?

## 2021-11-01 NOTE — Progress Notes (Signed)
Called [facility] to schedule appointment . Then called patient to inform her that they scheduled appt for Mammogram on June 13 at 11:20 am ?

## 2021-12-03 ENCOUNTER — Ambulatory Visit
Admission: RE | Admit: 2021-12-03 | Discharge: 2021-12-03 | Disposition: A | Discharge status: Home / Self Care | Payer: Commercial Managed Care - HMO | Source: Ambulatory Visit | Attending: Family | Admitting: Family

## 2021-12-03 DIAGNOSIS — Z1231 Encounter for screening mammogram for malignant neoplasm of breast: Secondary | ICD-10-CM | POA: Insufficient documentation

## 2021-12-30 ENCOUNTER — Encounter: Payer: Self-pay | Admitting: Family

## 2021-12-30 ENCOUNTER — Ambulatory Visit: Payer: Commercial Managed Care - HMO | Admitting: Family

## 2021-12-30 VITALS — BP 130/78 | HR 88 | Temp 98.1°F | Ht 64.0 in | Wt 152.0 lb

## 2021-12-30 DIAGNOSIS — E785 Hyperlipidemia, unspecified: Secondary | ICD-10-CM | POA: Diagnosis not present

## 2021-12-30 DIAGNOSIS — I10 Essential (primary) hypertension: Secondary | ICD-10-CM | POA: Diagnosis not present

## 2021-12-30 DIAGNOSIS — E119 Type 2 diabetes mellitus without complications: Secondary | ICD-10-CM

## 2021-12-30 NOTE — Assessment & Plan Note (Signed)
Pending lipid panel.  Continue Crestor 10 mg once per week.  If unable to obtain LDL less than 70, will add Zetia

## 2021-12-30 NOTE — Assessment & Plan Note (Addendum)
Congratulated patient on lifestyle changes.  Anticipate improvement.  Pending A1c.  Continue metformin 1000 mg twice daily.  Consider increasing lisinopril from 20 mg to 40 mg based on microalbumin.

## 2021-12-30 NOTE — Progress Notes (Signed)
Subjective:    Patient ID: Melissa Garrison, female    DOB: [birth year], 61 y.o.   MRN: [id number]  CC: Melissa Garrison is a 61 y.o. female who presents today for follow up.   HPI: Feels well today.  No new complaints.  She been checking blood sugars, fasting blood sugars 132, 144, 133, 116.  She has been walking daily least 5 minutes.  She is working on weight loss.  Compliant with metformin 1000 mg twice daily.  Hypertension-compliant with lisinopril 20 mg, amlodipine 5 mg Hyperlipidemia-she is been able to tolerate taking Crestor 10 mg once per week.  If she increases she will have myalgias.    HISTORY:  Past Medical History:  Diagnosis Date   Allergic rhinitis 07/11/2011   Diabetes mellitus without complication (HCC)    Hepatitis C    Dx: 2011 Dr Jerilee Hoh STATES SHE WENT THROUGH TREATMENT AND STATES HEP C IS NOW RESOLVED    Hepatitis C, chronic ([place]) 07/11/2011   HTN (hypertension)    Hypertension 07/11/2011   IV drug abuse ([place])    HISTORY - greater than 10 years   Vitamin D deficiency    Past Surgical History:  Procedure Laterality Date   BREAST BIOPSY Left 12/05/2015   stereotactic biopsy- benign   CAPSULAR RELEASE Left 07/02/2021   Procedure: CAPSULAR RELEASE;  Surgeon: Leim Fabry, MD;  Location: [place];  Service: Orthopedics;  Laterality: Left;   CHOLECYSTECTOMY  2011   CLOSED MANIPULATION SHOULDER WITH STERIOD INJECTION Left 07/02/2021   Procedure: CLOSED MANIPULATION SHOULDER WITH STEROID INJECTION;  Surgeon: Leim Fabry, MD;  Location: [place];  Service: Orthopedics;  Laterality: Left;   COLONOSCOPY  2017   HUMERUS SURGERY Left    due to fracture   HYDRADENITIS EXCISION Right 03/20/2017   Procedure: EXCISION HIDRADENITIS AXILLA;  Surgeon: Robert Bellow, MD;  Location: [facility];  Service: General;  Laterality: Right;   LAPAROSCOPIC HYSTERECTOMY     for noncancerous reason for fibroids. Still has ovaries. no cervix on exam 10/10/20    LYSIS OF ADHESION Left 07/02/2021   Procedure: LYSIS OF ADHESION;  Surgeon: Leim Fabry, MD;  Location: [place];  Service: Orthopedics;  Laterality: Left;   SHOULDER ARTHROSCOPY Left 07/02/2021   Procedure: ARTHROSCOPY SHOULDER;arthoscopic lysis of adhesions, capsular release, and manipulation under anesthesia with coricosteroid injection;  Surgeon: Leim Fabry, MD;  Location: [place];  Service: Orthopedics;  Laterality: Left;   SHOULDER ARTHROSCOPY WITH SUBACROMIAL DECOMPRESSION AND OPEN ROTATOR C Left 01/04/2021   Procedure: Left shoulder arthroscopic rotator cuff repair  and subacromial decompression, and biceps tenodesis;  Surgeon: Leim Fabry, MD;  Location: [facility];  Service: Orthopedics;  Laterality: Left;   Family History  Problem Relation Age of Onset   Hypertension Mother    Diabetes Father    Hypertension Brother    Breast cancer Neg Hx    Colon cancer Neg Hx    Thyroid cancer Neg Hx     Allergies: Metformin and related Current Outpatient Medications on File Prior to Visit  Medication Sig Dispense Refill   acetaminophen (TYLENOL) 500 MG tablet Take 2 tablets (1,000 mg total) by mouth every 8 (eight) hours. 90 tablet 2   amLODipine (NORVASC) 5 MG tablet Take 1 tablet (5 mg total) by mouth daily. 90 tablet 3   Clotrimazole 1 % OINT Apply 1 application. topically 2 (two) times daily. 56.7 g 1   glucose blood (ONETOUCH VERIO) test strip Used to check blood sugars  once a day. 100 each 3   lisinopril (ZESTRIL) 20 MG tablet Take 1 tablet (20 mg total) by mouth daily. 90 tablet 2   metFORMIN (GLUCOPHAGE XR) 500 MG 24 hr tablet Take 2 tablets (1,000 mg total) by mouth in the morning and at bedtime. 120 tablet 3   ondansetron (ZOFRAN-ODT) 4 MG disintegrating tablet Take 1 tablet (4 mg total) by mouth every 8 (eight) hours as needed for nausea or vomiting. 20 tablet 0   OneTouch Delica Lancets 78L MISC Used to check blood sugar once daily. 100 each 3    rosuvastatin (CRESTOR) 10 MG tablet Take 1 tablet (10 mg total) by mouth once a week. 90 tablet 0   No current facility-administered medications on file prior to visit.    Social History   Tobacco Use   Smoking status: Every Day    Packs/day: 0.50    Years: 10.00    Total pack years: 5.00    Types: Cigarettes   Smokeless tobacco: Never   Tobacco comments:    4/day  Vaping Use   Vaping Use: Never used  Substance Use Topics   Alcohol use: Yes    Comment: 2 beers per day.   Drug use: No    Comment: h/o iv drug use years ago    Review of Systems  Constitutional:  Negative for chills and fever.  Respiratory:  Negative for cough.   Cardiovascular:  Negative for chest pain and palpitations.  Gastrointestinal:  Negative for nausea and vomiting.      Objective:    BP 130/78 (BP Location: Left Arm, Patient Position: Sitting, Cuff Size: Normal)   Pulse 88   Temp 98.1 F (36.7 C) (Oral)   Ht '5\' 4"'$  (1.626 m)   Wt 152 lb (68.9 kg)   SpO2 97%   BMI 26.09 kg/m  BP Readings from Last 3 Encounters:  12/30/21 130/78  10/28/21 132/82  10/18/21 130/86   Wt Readings from Last 3 Encounters:  12/30/21 152 lb (68.9 kg)  10/28/21 160 lb (72.6 kg)  10/18/21 163 lb 12.8 oz (74.3 kg)    Physical Exam Vitals reviewed.  Constitutional:      Appearance: She is well-developed.  Eyes:     Conjunctiva/sclera: Conjunctivae normal.  Cardiovascular:     Rate and Rhythm: Normal rate and regular rhythm.     Pulses: Normal pulses.     Heart sounds: Normal heart sounds.  Pulmonary:     Effort: Pulmonary effort is normal.     Breath sounds: Normal breath sounds. No wheezing, rhonchi or rales.  Skin:    General: Skin is warm and dry.  Neurological:     Mental Status: She is alert.  Psychiatric:        Speech: Speech normal.        Behavior: Behavior normal.        Thought Content: Thought content normal.        Assessment & Plan:   Problem List Items Addressed This Visit        Cardiovascular and Mediastinum   Hypertension    Chronic, controlled. Continue  lisinopril 20 mg, amlodipine 5 mg        Endocrine   Diabetes mellitus without complication ([place]) - Primary    Congratulated patient on lifestyle changes.  Anticipate improvement.  Pending A1c.  Continue metformin 1000 mg twice daily.  Consider increasing lisinopril from 20 mg to 40 mg based on microalbumin.      Relevant Orders  Hemoglobin A1c   Microalbumin / creatinine urine ratio   Lipid panel     Other   HLD (hyperlipidemia)    Pending lipid panel.  Continue Crestor 10 mg once per week.  If unable to obtain LDL less than 70, will add Zetia        I am having [name] maintain her ondansetron, acetaminophen, Clotrimazole, amLODipine, lisinopril, rosuvastatin, OneTouch Delica Lancets 23J, OneTouch Verio, and metFORMIN.   No orders of the defined types were placed in this encounter.   Return precautions given.   Risks, benefits, and alternatives of the medications and treatment plan prescribed today were discussed, and patient expressed understanding.   Education regarding symptom management and diagnosis given to patient on AVS.  Continue to follow with Burnard Hawthorne, FNP for routine health maintenance.   [name] and I agreed with plan.   Mable Paris, FNP

## 2021-12-30 NOTE — Assessment & Plan Note (Signed)
Chronic, controlled. Continue  lisinopril 20 mg, amlodipine 5 mg

## 2022-01-23 ENCOUNTER — Other Ambulatory Visit (INDEPENDENT_AMBULATORY_CARE_PROVIDER_SITE_OTHER): Payer: Commercial Managed Care - HMO

## 2022-01-23 DIAGNOSIS — E119 Type 2 diabetes mellitus without complications: Secondary | ICD-10-CM | POA: Diagnosis not present

## 2022-01-23 LAB — LIPID PANEL
Cholesterol: 162 mg/dL (ref 0–200)
HDL: 57.6 mg/dL (ref >39.00)
LDL Cholesterol: 85 mg/dL (ref 0–99)
NonHDL: 104.24
Total CHOL/HDL Ratio: 3
Triglycerides: 95 mg/dL (ref 0.0–149.0)
VLDL: 19 mg/dL (ref 0.0–40.0)

## 2022-01-23 LAB — MICROALBUMIN / CREATININE URINE RATIO
Creatinine,U: 137.1 mg/dL
Microalb Creat Ratio: 2 mg/g (ref 0.0–30.0)
Microalb, Ur: 2.8 mg/dL — ABNORMAL HIGH (ref 0.0–1.9)

## 2022-01-23 LAB — HEMOGLOBIN A1C: Hgb A1c MFr Bld: 7.8 % — ABNORMAL HIGH (ref 4.6–6.5)

## 2022-01-28 ENCOUNTER — Telehealth: Payer: Self-pay | Admitting: Family

## 2022-01-28 DIAGNOSIS — I10 Essential (primary) hypertension: Secondary | ICD-10-CM

## 2022-01-28 NOTE — Telephone Encounter (Signed)
Pt came into the office stating the provider was going to increase her lisinopril to '40mg'$  and for her crestor she wanted her to start taking it  2x a week instead of once a week. Pt is concerned she may run out of medicine before her insurance can pay for it

## 2022-01-29 MED ORDER — LISINOPRIL 40 MG PO TABS: 40.0000 mg | ORAL_TABLET | Freq: Every day | ORAL | 1 refills | Status: DC

## 2022-01-29 MED ORDER — ROSUVASTATIN CALCIUM 10 MG PO TABS: ORAL_TABLET | ORAL | 2 refills | Status: DC

## 2022-01-29 NOTE — Telephone Encounter (Signed)
Call patient I sent in new rx for increased lisinopril from 20 mg to 40 mg.  I ordered a BMP lab.  Please schedule 7 days after starting increased dose.  I also refilled Crestor so she may take it twice per week.  Please advise her to gradually try to increase Crestor to 3 days/week if tolerated.

## 2022-01-30 NOTE — Telephone Encounter (Signed)
LVM to call back to office  

## 2022-01-31 NOTE — Telephone Encounter (Signed)
LVM TO CALL BACK

## 2022-01-31 NOTE — Telephone Encounter (Signed)
Spoke to patient in regards to her message and informed her of the change in her medication  Lisinopril from 20 mg to 40 mg and we scheduled a repeat lab for 8/21. We discussed her increasing the Crestor from 2/day to 3/day? Patient is not really sure if she can do that because they make her legs cramp. But will let us know if she can tolerate the increase?

## 2022-02-10 ENCOUNTER — Telehealth: Payer: Self-pay | Admitting: Family

## 2022-02-10 ENCOUNTER — Encounter: Payer: Self-pay | Admitting: Family

## 2022-02-10 ENCOUNTER — Ambulatory Visit (INDEPENDENT_AMBULATORY_CARE_PROVIDER_SITE_OTHER): Payer: Commercial Managed Care - HMO | Admitting: Family

## 2022-02-10 DIAGNOSIS — I10 Essential (primary) hypertension: Secondary | ICD-10-CM | POA: Diagnosis not present

## 2022-02-10 DIAGNOSIS — E119 Type 2 diabetes mellitus without complications: Secondary | ICD-10-CM | POA: Diagnosis not present

## 2022-02-10 DIAGNOSIS — E785 Hyperlipidemia, unspecified: Secondary | ICD-10-CM | POA: Diagnosis not present

## 2022-02-10 LAB — BASIC METABOLIC PANEL
BUN: 10 mg/dL (ref 6–23)
CO2: 24 mEq/L (ref 19–32)
Calcium: 10 mg/dL (ref 8.4–10.5)
Chloride: 102 mEq/L (ref 96–112)
Creatinine, Ser: 0.57 mg/dL (ref 0.40–1.20)
GFR: 99.59 mL/min (ref >60.00)
Glucose, Bld: 123 mg/dL — ABNORMAL HIGH (ref 70–99)
Potassium: 4.2 mEq/L (ref 3.5–5.1)
Sodium: 136 mEq/L (ref 135–145)

## 2022-02-10 MED ORDER — ROSUVASTATIN CALCIUM 10 MG PO TABS: ORAL_TABLET | ORAL | 2 refills | Status: DC

## 2022-02-10 NOTE — Assessment & Plan Note (Signed)
Chronic, stable.  Continue lisinopril 40 mg, amlodipine 5 mg

## 2022-02-10 NOTE — Patient Instructions (Signed)
Increase crestor to 3 days per week

## 2022-02-10 NOTE — Telephone Encounter (Signed)
Melissa Garrison What is status of gi referral for colonoscopy?

## 2022-02-10 NOTE — Assessment & Plan Note (Signed)
Lab Results  Component Value Date   LDLCALC 85 01/23/2022  LDL slightly above goal.  Patient agreeable to trial increase Crestor 10 mg from 2 days a week to 3 days a week and monitor if myalgias return.  May consider Zetia if needed

## 2022-02-10 NOTE — Assessment & Plan Note (Signed)
Lab Results  Component Value Date   HGBA1C 7.8 (H) 01/23/2022   Improved from prior.  She has lost 11lbs in 4 months time. Agreed to continue metformin 1000 mg twice daily to give a1c more time to reach < 6.5.

## 2022-02-10 NOTE — Progress Notes (Signed)
Subjective:    Patient ID: Melissa Garrison, female    DOB: December 27, 1960, 61 y.o.   MRN: [id number]  CC: Melissa Garrison is a 61 y.o. female who presents today for follow up.   HPI: Feels well today.  No new complaint   Hypertension-compliant lisinopril 40 mg, amlodipine 5 mg.  Recently increase lisinopril from 20 mg to 40 mg. No cp.   DM- -compliant with metformin 1000 mg twice daily. She is following low glycemic diet.   Walking 3 times per week.   Colonoscopy is due  Myalgia from statin. She has been tolerating crestor twice per week without myalgia.   Reviewed labs from 01/23/22 HISTORY:  Past Medical History:  Diagnosis Date   Allergic rhinitis 07/11/2011   Diabetes mellitus without complication (HCC)    Hepatitis C    Dx: 2011 Dr Jerilee Hoh STATES SHE WENT THROUGH TREATMENT AND STATES HEP C IS NOW RESOLVED    Hepatitis C, chronic ([place]) 07/11/2011   HTN (hypertension)    Hypertension 07/11/2011   IV drug abuse ([place])    HISTORY - greater than 10 years   Vitamin D deficiency    Past Surgical History:  Procedure Laterality Date   BREAST BIOPSY Left 12/05/2015   stereotactic biopsy- benign   CAPSULAR RELEASE Left 07/02/2021   Procedure: CAPSULAR RELEASE;  Surgeon: Leim Fabry, MD;  Location: [place];  Service: Orthopedics;  Laterality: Left;   CHOLECYSTECTOMY  2011   CLOSED MANIPULATION SHOULDER WITH STERIOD INJECTION Left 07/02/2021   Procedure: CLOSED MANIPULATION SHOULDER WITH STEROID INJECTION;  Surgeon: Leim Fabry, MD;  Location: [place];  Service: Orthopedics;  Laterality: Left;   COLONOSCOPY  2017   HUMERUS SURGERY Left    due to fracture   HYDRADENITIS EXCISION Right 03/20/2017   Procedure: EXCISION HIDRADENITIS AXILLA;  Surgeon: Robert Bellow, MD;  Location: [facility];  Service: General;  Laterality: Right;   LAPAROSCOPIC HYSTERECTOMY     for noncancerous reason for fibroids. Still has ovaries. no cervix on exam 10/10/20   LYSIS  OF ADHESION Left 07/02/2021   Procedure: LYSIS OF ADHESION;  Surgeon: Leim Fabry, MD;  Location: [place];  Service: Orthopedics;  Laterality: Left;   SHOULDER ARTHROSCOPY Left 07/02/2021   Procedure: ARTHROSCOPY SHOULDER;arthoscopic lysis of adhesions, capsular release, and manipulation under anesthesia with coricosteroid injection;  Surgeon: Leim Fabry, MD;  Location: [place];  Service: Orthopedics;  Laterality: Left;   SHOULDER ARTHROSCOPY WITH SUBACROMIAL DECOMPRESSION AND OPEN ROTATOR C Left 01/04/2021   Procedure: Left shoulder arthroscopic rotator cuff repair  and subacromial decompression, and biceps tenodesis;  Surgeon: Leim Fabry, MD;  Location: [facility];  Service: Orthopedics;  Laterality: Left;   Family History  Problem Relation Age of Onset   Hypertension Mother    Diabetes Father    Hypertension Brother    Breast cancer Neg Hx    Colon cancer Neg Hx    Thyroid cancer Neg Hx     Allergies: Metformin and related Current Outpatient Medications on File Prior to Visit  Medication Sig Dispense Refill   acetaminophen (TYLENOL) 500 MG tablet Take 2 tablets (1,000 mg total) by mouth every 8 (eight) hours. (Patient not taking: Reported on 02/10/2022) 90 tablet 2   amLODipine (NORVASC) 5 MG tablet Take 1 tablet (5 mg total) by mouth daily. 90 tablet 3   glucose blood (ONETOUCH VERIO) test strip Used to check blood sugars once a day. 100 each 3   lisinopril (ZESTRIL) 40 MG  tablet Take 1 tablet (40 mg total) by mouth daily. 90 tablet 1   metFORMIN (GLUCOPHAGE XR) 500 MG 24 hr tablet Take 2 tablets (1,000 mg total) by mouth in the morning and at bedtime. 120 tablet 3   OneTouch Delica Lancets 03K MISC Used to check blood sugar once daily. 100 each 3   No current facility-administered medications on file prior to visit.    Social History   Tobacco Use   Smoking status: Every Day    Packs/day: 0.50    Years: 10.00    Total pack years: 5.00    Types:  Cigarettes   Smokeless tobacco: Never   Tobacco comments:    4/day  Vaping Use   Vaping Use: Never used  Substance Use Topics   Alcohol use: Yes    Comment: 2 beers per day.   Drug use: No    Comment: h/o iv drug use years ago    Review of Systems  Constitutional:  Negative for chills and fever.  Respiratory:  Negative for cough.   Cardiovascular:  Negative for chest pain and palpitations.  Gastrointestinal:  Negative for nausea and vomiting.      Objective:    BP 118/70   Pulse 71   Temp 98.1 F (36.7 C) (Oral)   Ht '5\' 4"'$  (1.626 m)   Wt 149 lb 6.4 oz (67.8 kg)   SpO2 99%   BMI 25.64 kg/m  BP Readings from Last 3 Encounters:  02/10/22 118/70  12/30/21 130/78  10/28/21 132/82   Wt Readings from Last 3 Encounters:  02/10/22 149 lb 6.4 oz (67.8 kg)  12/30/21 152 lb (68.9 kg)  10/28/21 160 lb (72.6 kg)    Physical Exam Vitals reviewed.  Constitutional:      Appearance: She is well-developed.  Eyes:     Conjunctiva/sclera: Conjunctivae normal.  Cardiovascular:     Rate and Rhythm: Normal rate and regular rhythm.     Pulses: Normal pulses.     Heart sounds: Normal heart sounds.  Pulmonary:     Effort: Pulmonary effort is normal.     Breath sounds: Normal breath sounds. No wheezing, rhonchi or rales.  Skin:    General: Skin is warm and dry.  Neurological:     Mental Status: She is alert.  Psychiatric:        Speech: Speech normal.        Behavior: Behavior normal.        Thought Content: Thought content normal.        Assessment & Plan:   Problem List Items Addressed This Visit       Cardiovascular and Mediastinum   Hypertension    Chronic, stable.  Continue lisinopril 40 mg, amlodipine 5 mg      Relevant Medications   rosuvastatin (CRESTOR) 10 MG tablet     Endocrine   Diabetes mellitus without complication (HCC)    Lab Results  Component Value Date   HGBA1C 7.8 (H) 01/23/2022  Improved from prior.  She has lost 11lbs in 4 months time.  Agreed to continue metformin 1000 mg twice daily to give a1c more time to reach < 6.5.       Relevant Medications   rosuvastatin (CRESTOR) 10 MG tablet     Other   HLD (hyperlipidemia)    Lab Results  Component Value Date   LDLCALC 85 01/23/2022  LDL slightly above goal.  Patient agreeable to trial increase Crestor 10 mg from 2 days a week  to 3 days a week and monitor if myalgias return.  May consider Zetia if needed      Relevant Medications   rosuvastatin (CRESTOR) 10 MG tablet   Other Visit Diagnoses     Essential hypertension       Relevant Medications   rosuvastatin (CRESTOR) 10 MG tablet        I have discontinued Ndia Abbett's ondansetron and Clotrimazole. I have also changed her rosuvastatin. Additionally, I am having her maintain her acetaminophen, amLODipine, OneTouch Delica Lancets 67P, OneTouch Verio, metFORMIN, and lisinopril.   Meds ordered this encounter  Medications   rosuvastatin (CRESTOR) 10 MG tablet    Sig: Take 1 tablet daily three days per week    Dispense:  48 tablet    Refill:  2    Order Specific Question:   Supervising Provider    Answer:   Crecencio Mc [[id number]]    Return precautions given.   Risks, benefits, and alternatives of the medications and treatment plan prescribed today were discussed, and patient expressed understanding.   Education regarding symptom management and diagnosis given to patient on AVS.  Continue to follow with Burnard Hawthorne, FNP for routine health maintenance.   [name] and I agreed with plan.   Mable Paris, FNP

## 2022-02-25 ENCOUNTER — Other Ambulatory Visit: Payer: Self-pay

## 2022-02-25 ENCOUNTER — Telehealth: Payer: Self-pay | Admitting: Family

## 2022-02-25 MED ORDER — ONETOUCH DELICA LANCETS 33G MISC: 3 refills | Status: AC

## 2022-02-25 NOTE — Telephone Encounter (Signed)
Rx sent lvm to inform patient

## 2022-02-25 NOTE — Telephone Encounter (Signed)
Pt called back and I let the pt know the medication was sent it and she stated Thank you so much

## 2022-02-25 NOTE — Telephone Encounter (Signed)
Thanks Ashey!

## 2022-02-25 NOTE — Telephone Encounter (Signed)
Pt need refill on one touch strips sent to [place]. Pt need a new script because she checks her sugar twice a day

## 2022-03-24 ENCOUNTER — Other Ambulatory Visit: Payer: Self-pay

## 2022-03-24 ENCOUNTER — Telehealth: Payer: Self-pay | Admitting: Family

## 2022-03-24 MED ORDER — ONETOUCH VERIO VI STRP: ORAL_STRIP | 3 refills | Status: DC

## 2022-03-24 NOTE — Telephone Encounter (Signed)
Patient is requesting a refill on her glucose blood (ONETOUCH VERIO) test strip.

## 2022-03-24 NOTE — Telephone Encounter (Signed)
Rx sent and pt notified.

## 2022-04-18 ENCOUNTER — Other Ambulatory Visit: Payer: Self-pay

## 2022-04-18 ENCOUNTER — Telehealth: Payer: Self-pay | Admitting: Family

## 2022-04-18 DIAGNOSIS — E119 Type 2 diabetes mellitus without complications: Secondary | ICD-10-CM

## 2022-04-18 MED ORDER — METFORMIN HCL ER 500 MG PO TB24: 1000.0000 mg | ORAL_TABLET | Freq: Two times a day (BID) | ORAL | 3 refills | Status: DC

## 2022-04-18 NOTE — Telephone Encounter (Signed)
Rx sent pt notified

## 2022-04-18 NOTE — Telephone Encounter (Signed)
Pt need a refill on metformin sent to [facility]

## 2022-05-02 ENCOUNTER — Ambulatory Visit: Payer: Commercial Managed Care - HMO | Admitting: Family

## 2022-05-13 ENCOUNTER — Encounter: Payer: Self-pay | Admitting: Family

## 2022-05-13 ENCOUNTER — Ambulatory Visit: Payer: Commercial Managed Care - HMO | Admitting: Family

## 2022-05-13 VITALS — BP 118/68 | HR 8 | Temp 98.2°F | Wt 150.0 lb

## 2022-05-13 DIAGNOSIS — R7309 Other abnormal glucose: Secondary | ICD-10-CM

## 2022-05-13 DIAGNOSIS — I10 Essential (primary) hypertension: Secondary | ICD-10-CM | POA: Diagnosis not present

## 2022-05-13 DIAGNOSIS — E119 Type 2 diabetes mellitus without complications: Secondary | ICD-10-CM

## 2022-05-13 LAB — POCT GLYCOSYLATED HEMOGLOBIN (HGB A1C): Hemoglobin A1C: 6.6 % — AB (ref 4.0–5.6)

## 2022-05-13 NOTE — Progress Notes (Signed)
Subjective:    Patient ID: Melissa Garrison, female    DOB: 02/11/1961, 61 y.o.   MRN: [id number]  CC: Melissa Garrison is a 61 y.o. female who presents today for follow up.   HPI: Feels well today.  No new complaints  She has been eating healthier diet   DM-compliant metformin '1000mg'$  BID Hypertension-compliant with amlodipine 5 mg, lisinopril 40 mg Hyperlipidemia-compliant with Crestor '10mg'$   Smoker. Declines CT lung cancer screen  Colonoscopy is scheduled.  HISTORY:  Past Medical History:  Diagnosis Date   Allergic rhinitis 07/11/2011   Diabetes mellitus without complication (HCC)    Hepatitis C    Dx: 2011 Dr Jerilee Hoh STATES SHE WENT THROUGH TREATMENT AND STATES HEP C IS NOW RESOLVED    Hepatitis C, chronic ([place]) 07/11/2011   HTN (hypertension)    Hypertension 07/11/2011   IV drug abuse ([place])    HISTORY - greater than 10 years   Vitamin D deficiency    Past Surgical History:  Procedure Laterality Date   BREAST BIOPSY Left 12/05/2015   stereotactic biopsy- benign   CAPSULAR RELEASE Left 07/02/2021   Procedure: CAPSULAR RELEASE;  Surgeon: Leim Fabry, MD;  Location: [place];  Service: Orthopedics;  Laterality: Left;   CHOLECYSTECTOMY  2011   CLOSED MANIPULATION SHOULDER WITH STERIOD INJECTION Left 07/02/2021   Procedure: CLOSED MANIPULATION SHOULDER WITH STEROID INJECTION;  Surgeon: Leim Fabry, MD;  Location: [place];  Service: Orthopedics;  Laterality: Left;   COLONOSCOPY  2017   HUMERUS SURGERY Left    due to fracture   HYDRADENITIS EXCISION Right 03/20/2017   Procedure: EXCISION HIDRADENITIS AXILLA;  Surgeon: Robert Bellow, MD;  Location: [facility];  Service: General;  Laterality: Right;   LAPAROSCOPIC HYSTERECTOMY     for noncancerous reason for fibroids. Still has ovaries. no cervix on exam 10/10/20   LYSIS OF ADHESION Left 07/02/2021   Procedure: LYSIS OF ADHESION;  Surgeon: Leim Fabry, MD;  Location: [place];   Service: Orthopedics;  Laterality: Left;   SHOULDER ARTHROSCOPY Left 07/02/2021   Procedure: ARTHROSCOPY SHOULDER;arthoscopic lysis of adhesions, capsular release, and manipulation under anesthesia with coricosteroid injection;  Surgeon: Leim Fabry, MD;  Location: [place];  Service: Orthopedics;  Laterality: Left;   SHOULDER ARTHROSCOPY WITH SUBACROMIAL DECOMPRESSION AND OPEN ROTATOR C Left 01/04/2021   Procedure: Left shoulder arthroscopic rotator cuff repair  and subacromial decompression, and biceps tenodesis;  Surgeon: Leim Fabry, MD;  Location: [facility];  Service: Orthopedics;  Laterality: Left;   Family History  Problem Relation Age of Onset   Hypertension Mother    Diabetes Father    Hypertension Brother    Breast cancer Neg Hx    Colon cancer Neg Hx    Thyroid cancer Neg Hx     Allergies: Metformin and related Current Outpatient Medications on File Prior to Visit  Medication Sig Dispense Refill   amLODipine (NORVASC) 5 MG tablet Take 1 tablet (5 mg total) by mouth daily. 90 tablet 3   glucose blood (ONETOUCH VERIO) test strip Used to check blood sugars once a day. 100 each 3   lisinopril (ZESTRIL) 40 MG tablet Take 1 tablet (40 mg total) by mouth daily. 90 tablet 1   metFORMIN (GLUCOPHAGE XR) 500 MG 24 hr tablet Take 2 tablets (1,000 mg total) by mouth in the morning and at bedtime. 120 tablet 3   OneTouch Delica Lancets 12W MISC Used to check blood sugar once daily. 200 each 3   rosuvastatin (  CRESTOR) 10 MG tablet Take 1 tablet daily three days per week 48 tablet 2   acetaminophen (TYLENOL) 500 MG tablet Take 2 tablets (1,000 mg total) by mouth every 8 (eight) hours. (Patient not taking: Reported on 02/10/2022) 90 tablet 2   No current facility-administered medications on file prior to visit.    Social History   Tobacco Use   Smoking status: Every Day    Packs/day: 0.50    Years: 10.00    Total pack years: 5.00    Types: Cigarettes   Smokeless tobacco:  Never   Tobacco comments:    4/day  Vaping Use   Vaping Use: Never used  Substance Use Topics   Alcohol use: Yes    Comment: 2 beers per day.   Drug use: No    Comment: h/o iv drug use years ago    Review of Systems  Constitutional:  Negative for chills and fever.  Respiratory:  Negative for cough.   Cardiovascular:  Negative for chest pain and palpitations.  Gastrointestinal:  Negative for nausea and vomiting.      Objective:    BP 118/68   Pulse (!) 8   Temp 98.2 F (36.8 C) (Oral)   Wt 150 lb (68 kg)   SpO2 98%   BMI 25.75 kg/m  BP Readings from Last 3 Encounters:  05/13/22 118/68  02/10/22 118/70  12/30/21 130/78   Wt Readings from Last 3 Encounters:  05/13/22 150 lb (68 kg)  02/10/22 149 lb 6.4 oz (67.8 kg)  12/30/21 152 lb (68.9 kg)    Physical Exam Vitals reviewed.  Constitutional:      Appearance: She is well-developed.  Eyes:     Conjunctiva/sclera: Conjunctivae normal.  Cardiovascular:     Rate and Rhythm: Normal rate and regular rhythm.     Pulses: Normal pulses.     Heart sounds: Normal heart sounds.  Pulmonary:     Effort: Pulmonary effort is normal.     Breath sounds: Normal breath sounds. No wheezing, rhonchi or rales.  Skin:    General: Skin is warm and dry.  Neurological:     Mental Status: She is alert.  Psychiatric:        Speech: Speech normal.        Behavior: Behavior normal.        Thought Content: Thought content normal.        Assessment & Plan:   Problem List Items Addressed This Visit       Cardiovascular and Mediastinum   Hypertension    Chronic, stable.  Continue amlodipine 5 mg QD, lisinopril 40 mg QD        Endocrine   Diabetes mellitus without complication (HCC)    Improved. Continue metformin '1000mg'$  BID      Other Visit Diagnoses     Elevated glucose    -  Primary   Relevant Orders   POCT HgB A1C (Completed)        I am having Melissa Garrison maintain her acetaminophen, amLODipine,  lisinopril, rosuvastatin, OneTouch Delica Lancets 39J, OneTouch Verio, and metFORMIN.   No orders of the defined types were placed in this encounter.   Return precautions given.   Risks, benefits, and alternatives of the medications and treatment plan prescribed today were discussed, and patient expressed understanding.   Education regarding symptom management and diagnosis given to patient on AVS.  Continue to follow with Burnard Hawthorne, FNP for routine health maintenance.   Melissa Garrison and  I agreed with plan.   Mable Paris, FNP

## 2022-05-13 NOTE — Assessment & Plan Note (Signed)
Improved. Continue metformin '1000mg'$  BID

## 2022-05-13 NOTE — Assessment & Plan Note (Addendum)
Chronic, stable.  Continue amlodipine 5 mg QD, lisinopril 40 mg QD 

## 2022-05-19 ENCOUNTER — Encounter: Payer: Self-pay | Admitting: *Deleted

## 2022-05-20 ENCOUNTER — Encounter
Admission: RE | Disposition: A | Discharge status: Home / Self Care | Payer: Self-pay | Source: Home / Self Care | Attending: Gastroenterology

## 2022-05-20 ENCOUNTER — Ambulatory Visit: Payer: Commercial Managed Care - HMO | Admitting: Anesthesiology

## 2022-05-20 ENCOUNTER — Other Ambulatory Visit: Payer: Self-pay

## 2022-05-20 ENCOUNTER — Ambulatory Visit
Admission: RE | Admit: 2022-05-20 | Discharge: 2022-05-20 | Disposition: A | Discharge status: Home / Self Care | Payer: Commercial Managed Care - HMO | Attending: Gastroenterology | Admitting: Gastroenterology

## 2022-05-20 ENCOUNTER — Encounter: Payer: Self-pay | Admitting: *Deleted

## 2022-05-20 DIAGNOSIS — Z7984 Long term (current) use of oral hypoglycemic drugs: Secondary | ICD-10-CM | POA: Insufficient documentation

## 2022-05-20 DIAGNOSIS — E119 Type 2 diabetes mellitus without complications: Secondary | ICD-10-CM | POA: Insufficient documentation

## 2022-05-20 DIAGNOSIS — Z9049 Acquired absence of other specified parts of digestive tract: Secondary | ICD-10-CM | POA: Insufficient documentation

## 2022-05-20 DIAGNOSIS — Z1211 Encounter for screening for malignant neoplasm of colon: Secondary | ICD-10-CM | POA: Insufficient documentation

## 2022-05-20 DIAGNOSIS — I10 Essential (primary) hypertension: Secondary | ICD-10-CM | POA: Insufficient documentation

## 2022-05-20 DIAGNOSIS — K640 First degree hemorrhoids: Secondary | ICD-10-CM | POA: Diagnosis not present

## 2022-05-20 DIAGNOSIS — Z90710 Acquired absence of both cervix and uterus: Secondary | ICD-10-CM | POA: Insufficient documentation

## 2022-05-20 DIAGNOSIS — Z86010 Personal history of colonic polyps: Secondary | ICD-10-CM | POA: Insufficient documentation

## 2022-05-20 DIAGNOSIS — Z8619 Personal history of other infectious and parasitic diseases: Secondary | ICD-10-CM | POA: Diagnosis not present

## 2022-05-20 DIAGNOSIS — D123 Benign neoplasm of transverse colon: Secondary | ICD-10-CM | POA: Diagnosis not present

## 2022-05-20 DIAGNOSIS — E785 Hyperlipidemia, unspecified: Secondary | ICD-10-CM | POA: Insufficient documentation

## 2022-05-20 HISTORY — PX: COLONOSCOPY WITH PROPOFOL: SHX5780

## 2022-05-20 LAB — GLUCOSE, CAPILLARY: Glucose-Capillary: 159 mg/dL — ABNORMAL HIGH (ref 70–99)

## 2022-05-20 SURGERY — COLONOSCOPY WITH PROPOFOL
Anesthesia: General

## 2022-05-20 MED ORDER — SODIUM CHLORIDE 0.9 % IV SOLN: INTRAVENOUS | Status: DC

## 2022-05-20 MED ORDER — PROPOFOL 1000 MG/100ML IV EMUL
INTRAVENOUS | Status: AC
  Filled 2022-05-20: qty 100

## 2022-05-20 MED ORDER — KETAMINE HCL 10 MG/ML IJ SOLN
INTRAMUSCULAR | Status: DC | PRN
  Administered 2022-05-20: 20 mg via INTRAVENOUS

## 2022-05-20 MED ORDER — LIDOCAINE HCL (CARDIAC) PF 100 MG/5ML IV SOSY
PREFILLED_SYRINGE | INTRAVENOUS | Status: DC | PRN
  Administered 2022-05-20: 50 mg via INTRAVENOUS

## 2022-05-20 MED ORDER — PROPOFOL 500 MG/50ML IV EMUL
INTRAVENOUS | Status: DC | PRN
  Administered 2022-05-20: 100 ug/kg/min via INTRAVENOUS

## 2022-05-20 MED ORDER — SODIUM CHLORIDE 0.9 % IV SOLN: INTRAVENOUS | Status: DC | PRN

## 2022-05-20 MED ORDER — MIDAZOLAM HCL 2 MG/2ML IJ SOLN
INTRAMUSCULAR | Status: AC
  Filled 2022-05-20: qty 2

## 2022-05-20 MED ORDER — PROPOFOL 10 MG/ML IV BOLUS
INTRAVENOUS | Status: DC | PRN
  Administered 2022-05-20: 40 mg via INTRAVENOUS

## 2022-05-20 MED ORDER — MIDAZOLAM HCL 2 MG/2ML IJ SOLN
INTRAMUSCULAR | Status: DC | PRN
  Administered 2022-05-20: 2 mg via INTRAVENOUS

## 2022-05-20 MED ORDER — KETAMINE HCL 50 MG/5ML IJ SOSY
PREFILLED_SYRINGE | INTRAMUSCULAR | Status: AC
  Filled 2022-05-20: qty 5

## 2022-05-20 NOTE — Anesthesia Postprocedure Evaluation (Signed)
Anesthesia Post Note  Patient: Melissa Garrison  Procedure(s) Performed: COLONOSCOPY WITH PROPOFOL  Patient location during evaluation: PACU Anesthesia Type: General Level of consciousness: awake and alert Pain management: pain level controlled Vital Signs Assessment: post-procedure vital signs reviewed and stable Respiratory status: spontaneous breathing, nonlabored ventilation and respiratory function stable Cardiovascular status: blood pressure returned to baseline and stable Postop Assessment: no apparent nausea or vomiting Anesthetic complications: no   No notable events documented.   Last Vitals:  Vitals:   05/20/22 0817 05/20/22 0827  BP: 122/69 115/69  Pulse:  72  Resp: 15 18  Temp:    SpO2: 100% 100%    Last Pain:  Vitals:   05/20/22 0827  TempSrc:   PainSc: 0-No pain                 [place] Ouch

## 2022-05-20 NOTE — Op Note (Signed)
[facility] Patient Name: Melissa Garrison Procedure Date: 05/20/2022 7:25 AM MRN: [id number] Account #: [id number] Date of Birth: Nov 21, 1960 Admit Type: Outpatient Age: 61 Room: [facility] ENDO ROOM 1 Gender: Female Note Status: Finalized Instrument Name: Jasper Riling [id number] Procedure:             Colonoscopy Indications:           Surveillance: Personal history of adenomatous polyps                         on last colonoscopy > 5 years ago Providers:             Andrey Farmer MD, MD Medicines:             Monitored Anesthesia Care Complications:         No immediate complications. Estimated blood loss:                         Minimal. Procedure:             Pre-Anesthesia Assessment:                        - Prior to the procedure, a History and Physical was                         performed, and patient medications and allergies were                         reviewed. The patient is competent. The risks and                         benefits of the procedure and the sedation options and                         risks were discussed with the patient. All questions                         were answered and informed consent was obtained.                         Patient identification and proposed procedure were                         verified by the physician, the nurse, the                         anesthesiologist, the anesthetist and the technician                         in the endoscopy suite. Mental Status Examination:                         alert and oriented. Airway Examination: normal                         oropharyngeal airway and neck mobility. Respiratory                         Examination: clear to auscultation. CV Examination:  normal. Prophylactic Antibiotics: The patient does not                         require prophylactic antibiotics. Prior                         Anticoagulants: The patient has taken no  anticoagulant                         or antiplatelet agents. ASA Grade Assessment: II - A                         patient with mild systemic disease. After reviewing                         the risks and benefits, the patient was deemed in                         satisfactory condition to undergo the procedure. The                         anesthesia plan was to use monitored anesthesia care                         (MAC). Immediately prior to administration of                         medications, the patient was re-assessed for adequacy                         to receive sedatives. The heart rate, respiratory                         rate, oxygen saturations, blood pressure, adequacy of                         pulmonary ventilation, and response to care were                         monitored throughout the procedure. The physical                         status of the patient was re-assessed after the                         procedure.                        After obtaining informed consent, the colonoscope was                         passed under direct vision. Throughout the procedure,                         the patient's blood pressure, pulse, and oxygen                         saturations were monitored continuously. The  Colonoscope was introduced through the anus and                         advanced to the the cecum, identified by appendiceal                         orifice and ileocecal valve. The colonoscopy was                         performed without difficulty. The patient tolerated                         the procedure well. The quality of the bowel                         preparation was adequate to identify polyps. The                         ileocecal valve, appendiceal orifice, and rectum were                         photographed. Findings:      The perianal and digital rectal examinations were normal.      A 2 mm polyp was found in the transverse  colon. The polyp was sessile.       The polyp was removed with a cold snare. Resection and retrieval were       complete. Estimated blood loss was minimal.      Internal hemorrhoids were found during retroflexion. The hemorrhoids       were Grade I (internal hemorrhoids that do not prolapse).      The exam was otherwise without abnormality on direct and retroflexion       views. Impression:            - One 2 mm polyp in the transverse colon, removed with                         a cold snare. Resected and retrieved.                        - Internal hemorrhoids.                        - The examination was otherwise normal on direct and                         retroflexion views. Recommendation:        - Discharge patient to home.                        - Resume previous diet.                        - Continue present medications.                        - Await pathology results.                        - Repeat colonoscopy in 7 years for surveillance.                        -  Return to referring physician as previously                         scheduled. Procedure Code(s):     --- Professional ---                        [phone], Colonoscopy, flexible; with removal of                         tumor(s), polyp(s), or other lesion(s) by snare                         technique Diagnosis Code(s):     --- Professional ---                        Z86.010, Personal history of colonic polyps                        D12.3, Benign neoplasm of transverse colon (hepatic                         flexure or splenic flexure)                        K64.0, First degree hemorrhoids CPT copyright 2022 American Medical Association. All rights reserved. The codes documented in this report are preliminary and upon coder review may  be revised to meet current compliance requirements. Andrey Farmer MD, MD 05/20/2022 8:08:37 AM Number of Addenda: 0 Note Initiated On: 05/20/2022 7:25 AM Scope Withdrawal Time: 0  hours 6 minutes 54 seconds  Total Procedure Duration: 0 hours 10 minutes 50 seconds  Estimated Blood Loss:  Estimated blood loss was minimal.      [facility]

## 2022-05-20 NOTE — Anesthesia Preprocedure Evaluation (Addendum)
Anesthesia Evaluation  Patient identified by MRN, date of birth, ID band Patient awake    Reviewed: Allergy & Precautions, NPO status , Patient's Chart, lab work & pertinent test results  Airway Mallampati: III  TM Distance: >3 FB Neck ROM: full    Dental  (+) Chipped, Missing,    Pulmonary Current SmokerPatient did not abstain from smoking.   Pulmonary exam normal        Cardiovascular Exercise Tolerance: Good hypertension, Normal cardiovascular exam     Neuro/Psych negative neurological ROS  negative psych ROS   GI/Hepatic negative GI ROS,,,S/p hepatitis C treatment   Endo/Other  diabetes, Type 2    Renal/GU negative Renal ROS  negative genitourinary   Musculoskeletal   Abdominal Normal abdominal exam  (+)   Peds  Hematology negative hematology ROS (+)   Anesthesia Other Findings Past Medical History: 07/11/2011: Allergic rhinitis No date: Diabetes mellitus without complication ([place]) No date: Hepatitis C     Comment:  Dx: 2011 Dr Jerilee Hoh STATES SHE WENT THROUGH TREATMENT              AND STATES HEP C IS NOW RESOLVED  07/11/2011: Hepatitis C, chronic (HCC) No date: HTN (hypertension) 07/11/2011: Hypertension No date: IV drug abuse ([place])     Comment:  HISTORY - greater than 10 years No date: Vitamin D deficiency  Past Surgical History: 12/05/2015: BREAST BIOPSY; Left     Comment:  stereotactic biopsy- benign 07/02/2021: CAPSULAR RELEASE; Left     Comment:  Procedure: CAPSULAR RELEASE;  Surgeon: Leim Fabry, MD;              Location: [place];  Service: Orthopedics;                Laterality: Left; 2011: CHOLECYSTECTOMY 07/02/2021: CLOSED MANIPULATION SHOULDER WITH STERIOD INJECTION; Left     Comment:  Procedure: CLOSED MANIPULATION SHOULDER WITH STEROID               INJECTION;  Surgeon: Leim Fabry, MD;  Location: [place];  Service: Orthopedics;  Laterality:  Left; 2017: COLONOSCOPY No date: HUMERUS SURGERY; Left     Comment:  due to fracture 03/20/2017: HYDRADENITIS EXCISION; Right     Comment:  Procedure: EXCISION HIDRADENITIS AXILLA;  Surgeon:               Robert Bellow, MD;  Location: [facility];  Service:               General;  Laterality: Right; No date: LAPAROSCOPIC HYSTERECTOMY     Comment:  for noncancerous reason for fibroids. Still has ovaries.              no cervix on exam 10/10/20 07/02/2021: LYSIS OF ADHESION; Left     Comment:  Procedure: LYSIS OF ADHESION;  Surgeon: Leim Fabry,               MD;  Location: [place];  Service:               Orthopedics;  Laterality: Left; 07/02/2021: SHOULDER ARTHROSCOPY; Left     Comment:  Procedure: ARTHROSCOPY SHOULDER;arthoscopic lysis of               adhesions, capsular release, and manipulation under               anesthesia with coricosteroid injection;  Surgeon: Posey Pronto,  Tarry Kos, MD;  Location: [place];  Service:               Orthopedics;  Laterality: Left; 01/04/2021: SHOULDER ARTHROSCOPY WITH SUBACROMIAL DECOMPRESSION AND  OPEN ROTATOR C; Left     Comment:  Procedure: Left shoulder arthroscopic rotator cuff               repair  and subacromial decompression, and biceps               tenodesis;  Surgeon: Leim Fabry, MD;  Location: [facility]               ORS;  Service: Orthopedics;  Laterality: Left;     Reproductive/Obstetrics negative OB ROS                             Anesthesia Physical Anesthesia Plan  ASA: 2  Anesthesia Plan: General   Post-op Pain Management:    Induction: Intravenous  PONV Risk Score and Plan: Propofol infusion and TIVA  Airway Management Planned: Natural Airway  Additional Equipment:   Intra-op Plan:   Post-operative Plan:   Informed Consent: I have reviewed the patients History and Physical, chart, labs and discussed the procedure including the risks, benefits and  alternatives for the proposed anesthesia with the patient or authorized representative who has indicated his/her understanding and acceptance.     Dental Advisory Given  Plan Discussed with: Anesthesiologist, CRNA and Surgeon  Anesthesia Plan Comments:         Anesthesia Quick Evaluation

## 2022-05-20 NOTE — Transfer of Care (Signed)
Immediate Anesthesia Transfer of Care Note  Patient: Melissa Garrison  Procedure(s) Performed: COLONOSCOPY WITH PROPOFOL  Patient Location: PACU and Endoscopy Unit  Anesthesia Type:General  Level of Consciousness: drowsy  Airway & Oxygen Therapy: Patient Spontanous Breathing  Post-op Assessment: Report given to RN and Post -op Vital signs reviewed and stable  Post vital signs: Reviewed and stable  Last Vitals:  Vitals Value Taken Time  BP 117/72   Temp    Pulse 90   Resp 22   SpO2 99     Last Pain:  Vitals:   05/20/22 0702  TempSrc: Temporal  PainSc: 0-No pain         Complications: No notable events documented.

## 2022-05-20 NOTE — Interval H&P Note (Signed)
History and Physical Interval Note:  05/20/2022 7:44 AM  Daniele Dillow  has presented today for surgery, with the diagnosis of HO TA polpys.  The various methods of treatment have been discussed with the patient and family. After consideration of risks, benefits and other options for treatment, the patient has consented to  Procedure(s): COLONOSCOPY WITH PROPOFOL (N/A) as a surgical intervention.  The patient's history has been reviewed, patient examined, no change in status, stable for surgery.  I have reviewed the patient's chart and labs.  Questions were answered to the patient's satisfaction.     Lesly Rubenstein  Ok to proceed with colonoscopy

## 2022-05-20 NOTE — H&P (Signed)
Outpatient short stay form Pre-procedure 05/20/2022  Lesly Rubenstein, MD  Primary Physician: Burnard Hawthorne, FNP  Reason for visit:  Surveillance  History of present illness:    61 y/o lady with history of hypertension, DM II, and HLD here for surveillance colonoscopy. Last colonoscopy in 2017 with small TA. No blood thinners. No family history of GI malignancies. History of cholecystectomy and hysterectomy.    Current Facility-Administered Medications:    0.9 %  sodium chloride infusion, , Intravenous, Continuous, Malgorzata Albert, Hilton Cork, MD  Medications Prior to Admission  Medication Sig Dispense Refill Last Dose   amLODipine (NORVASC) 5 MG tablet Take 1 tablet (5 mg total) by mouth daily. 90 tablet 3 05/20/2022 at 0600   lisinopril (ZESTRIL) 40 MG tablet Take 1 tablet (40 mg total) by mouth daily. 90 tablet 1 05/19/2022 at 0600   metFORMIN (GLUCOPHAGE XR) 500 MG 24 hr tablet Take 2 tablets (1,000 mg total) by mouth in the morning and at bedtime. 120 tablet 3 05/19/2022 at 1800   acetaminophen (TYLENOL) 500 MG tablet Take 2 tablets (1,000 mg total) by mouth every 8 (eight) hours. (Patient not taking: Reported on 02/10/2022) 90 tablet 2    glucose blood (ONETOUCH VERIO) test strip Used to check blood sugars once a day. 100 each 3    OneTouch Delica Lancets 39Q MISC Used to check blood sugar once daily. 200 each 3    rosuvastatin (CRESTOR) 10 MG tablet Take 1 tablet daily three days per week 48 tablet 2      Allergies  Allergen Reactions   Metformin And Related     Trouble sleeping - CAN TAKE LOWER DOSE     Past Medical History:  Diagnosis Date   Allergic rhinitis 07/11/2011   Diabetes mellitus without complication (HCC)    Hepatitis C    Dx: 2011 Dr Jerilee Hoh STATES SHE WENT THROUGH TREATMENT AND STATES HEP C IS NOW RESOLVED    Hepatitis C, chronic ([place]) 07/11/2011   HTN (hypertension)    Hypertension 07/11/2011   IV drug abuse ([place])    HISTORY - greater than 10  years   Vitamin D deficiency     Review of systems:  Otherwise negative.    Physical Exam  Gen: Alert, oriented. Appears stated age.  HEENT: PERRLA. Lungs: No respiratory distress CV: RRR Abd: soft, benign, no masses Ext: No edema    Planned procedures: Proceed with colonoscopy. The patient understands the nature of the planned procedure, indications, risks, alternatives and potential complications including but not limited to bleeding, infection, perforation, damage to internal organs and possible oversedation/side effects from anesthesia. The patient agrees and gives consent to proceed.  Please refer to procedure notes for findings, recommendations and patient disposition/instructions.     Lesly Rubenstein, MD [facility]

## 2022-05-21 ENCOUNTER — Encounter: Payer: Self-pay | Admitting: Gastroenterology

## 2022-05-21 LAB — SURGICAL PATHOLOGY

## 2022-06-11 ENCOUNTER — Ambulatory Visit (INDEPENDENT_AMBULATORY_CARE_PROVIDER_SITE_OTHER): Payer: Commercial Managed Care - HMO

## 2022-06-11 DIAGNOSIS — Z23 Encounter for immunization: Secondary | ICD-10-CM | POA: Diagnosis not present

## 2022-07-10 ENCOUNTER — Other Ambulatory Visit: Payer: Self-pay | Admitting: Orthopedic Surgery

## 2022-07-14 ENCOUNTER — Encounter: Payer: Self-pay | Admitting: Urgent Care

## 2022-07-14 ENCOUNTER — Encounter
Admission: RE | Admit: 2022-07-14 | Discharge: 2022-07-14 | Disposition: A | Discharge status: Home / Self Care | Payer: Commercial Managed Care - HMO | Source: Ambulatory Visit | Attending: Orthopedic Surgery | Admitting: Orthopedic Surgery

## 2022-07-14 ENCOUNTER — Other Ambulatory Visit: Payer: Self-pay

## 2022-07-14 VITALS — BP 150/87 | Resp 16 | Ht 64.0 in | Wt 145.0 lb

## 2022-07-14 DIAGNOSIS — E785 Hyperlipidemia, unspecified: Secondary | ICD-10-CM | POA: Diagnosis not present

## 2022-07-14 DIAGNOSIS — Z01810 Encounter for preprocedural cardiovascular examination: Secondary | ICD-10-CM | POA: Diagnosis not present

## 2022-07-14 DIAGNOSIS — E119 Type 2 diabetes mellitus without complications: Secondary | ICD-10-CM | POA: Diagnosis not present

## 2022-07-14 DIAGNOSIS — Z01818 Encounter for other preprocedural examination: Secondary | ICD-10-CM | POA: Diagnosis not present

## 2022-07-14 DIAGNOSIS — I10 Essential (primary) hypertension: Secondary | ICD-10-CM | POA: Insufficient documentation

## 2022-07-14 LAB — CBC
HCT: 33.6 % — ABNORMAL LOW (ref 36.0–46.0)
Hemoglobin: 11.5 g/dL — ABNORMAL LOW (ref 12.0–15.0)
MCH: 29.7 pg (ref 26.0–34.0)
MCHC: 34.2 g/dL (ref 30.0–36.0)
MCV: 86.8 fL (ref 80.0–100.0)
Platelets: 254 10*3/uL (ref 150–400)
RBC: 3.87 MIL/uL (ref 3.87–5.11)
RDW: 14.1 % (ref 11.5–15.5)
WBC: 8 10*3/uL (ref 4.0–10.5)
nRBC: 0 % (ref 0.0–0.2)

## 2022-07-14 LAB — BASIC METABOLIC PANEL
Anion gap: 11 (ref 5–15)
BUN: 12 mg/dL (ref 6–20)
CO2: 25 mmol/L (ref 22–32)
Calcium: 9.9 mg/dL (ref 8.9–10.3)
Chloride: 100 mmol/L (ref 98–111)
Creatinine, Ser: 0.57 mg/dL (ref 0.44–1.00)
GFR, Estimated: >60 mL/min (ref >60)
Glucose, Bld: 119 mg/dL — ABNORMAL HIGH (ref 70–99)
Potassium: 3.9 mmol/L (ref 3.5–5.1)
Sodium: 136 mmol/L (ref 135–145)

## 2022-07-14 NOTE — Pre-Procedure Instructions (Signed)
Patient showed up in office for phone call pre op

## 2022-07-14 NOTE — Patient Instructions (Addendum)
Your procedure is scheduled on: Tuesday 07/22/22 Report to [place]. To find out your arrival time please call [phone] between 1PM - 3PM on Monday 07/21/22 .  Remember: Instructions that are not followed completely may result in serious medical risk, up to and including death, or upon the discretion of your surgeon and anesthesiologist your surgery may need to be rescheduled.     _X__ 1. Do not eat food after midnight the night before your procedure.                 No gum chewing or hard candies. You may drink clear liquids up to 2 hours                 before you are scheduled to arrive for your surgery- DO not drink clear                 liquids within 2 hours of the start of your surgery.                 Clear Liquids include:   Diabetics water only  Drink the Gatorade 2 hours before arriving for surgery.  __X__2.  On the morning of surgery brush your teeth with toothpaste and water, you                 may rinse your mouth with mouthwash if you wish.  Do not swallow any              toothpaste of mouthwash.     _X__ 3.  No Alcohol for 24 hours before or after surgery.   _X__ 4.  Do Not Smoke or use e-cigarettes For 24 Hours Prior to Your Surgery.                 Do not use any chewable tobacco products for at least 6 hours prior to                 surgery.  ____  5.  Bring all medications with you on the day of surgery if instructed.   __X__  6.  Notify your doctor if there is any change in your medical condition      (cold, fever, infections).     Do not wear jewelry, make-up, hairpins, clips or nail polish. Do not wear lotions, powders, or perfumes. NO DEODORANT Do not shave body hair 48 hours prior to surgery. Men may shave face and neck. Do not bring valuables to the hospital.    [facility] is not responsible for any belongings or valuables.  Contacts, dentures/partials or body piercings may not be worn into  surgery. Bring a case for your contacts, glasses or hearing aids, a denture cup will be supplied. Leave your suitcase in the car. After surgery it may be brought to your room. For patients admitted to the hospital, discharge time is determined by your treatment team.   Patients discharged the day of surgery will need an adult driver. You will not be allowed to drive yourself home, take an uber, lyft or taxi. You may use medical transportation such as [place] as long as you have an adult with you or waiting to assist you upon your arrival home. You must have an adult over 53 years old in the home with you for the first 24 hours after surgery   Please read over the following fact sheets that you  were given:   MRSA Information, CHG soap  __X__ Take these medicines the morning of surgery with A SIP OF WATER:    1. Amlodipine  2.   3.   4.  5.  6.  ____ Fleet Enema (as directed)   __X__ Use CHG Soap/SAGE wipes as directed  ____ Use inhalers on the day of surgery  __X__ Stop metformin/Janumet/Farxiga 2 days prior to surgery  LAST DOSE SATURDAY 07/19/22  ____ Take 1/2 of usual insulin dose the night before surgery. No insulin the morning          of surgery.   ____ Stop Blood Thinners Coumadin/Plavix/Xarelto/Pleta/Pradaxa/Eliquis/Effient/Aspirin  on   Or contact your Surgeon, Cardiologist or Medical Doctor regarding  ability to stop your blood thinners  __X__ Stop Anti-inflammatories 7 days before surgery such as Advil, Ibuprofen, Motrin,  BC or Goodies Powder, Naprosyn, Naproxen, Aleve, Aspirin    __X__ Stop all herbals and supplements, fish oil or vitamins  until after surgery.    ____ Bring C-Pap to the hospital.    YOU CAN TAKE TYLENOL AS NEEDED FOR PAIN     Preparing for Surgery with CHLORHEXIDINE GLUCONATE (CHG) Soap  Chlorhexidine Gluconate (CHG) Soap  o An antiseptic cleaner that kills germs and bonds with the skin to continue killing germs even after  washing  o Used for showering the night before surgery and morning of surgery  Before surgery, you can play an important role by reducing the number of germs on your skin.  CHG (Chlorhexidine gluconate) soap is an antiseptic cleanser which kills germs and bonds with the skin to continue killing germs even after washing.  Please do not use if you have an allergy to CHG or antibacterial soaps. If your skin becomes reddened/irritated stop using the CHG.  1. Shower the NIGHT BEFORE SURGERY and the MORNING OF SURGERY with CHG soap.  2. If you choose to wash your hair, wash your hair first as usual with your normal shampoo.  3. After shampooing, rinse your hair and body thoroughly to remove the shampoo.  4. Use CHG as you would any other liquid soap. You can apply CHG directly to the skin and wash gently with a scrungie or a clean washcloth.  5. Apply the CHG soap to your body only from the neck down. Do not use on open wounds or open sores. Avoid contact with your eyes, ears, mouth, and genitals (private parts). Wash face and genitals (private parts) with your normal soap.  6. Wash thoroughly, paying special attention to the area where your surgery will be performed.  7. Thoroughly rinse your body with warm water.  8. Do not shower/wash with your normal soap after using and rinsing off the CHG soap.  9. Pat yourself dry with a clean towel.  10. Wear clean pajamas to bed the night before surgery.  12. Place clean sheets on your bed the night of your first shower and do not sleep with pets.  13. Shower again with the CHG soap on the day of surgery prior to arriving at the hospital.  14. Do not apply any deodorants/lotions/powders.  15. Please wear clean clothes to the hospital.

## 2022-07-18 ENCOUNTER — Other Ambulatory Visit: Payer: Self-pay | Admitting: Family

## 2022-07-18 DIAGNOSIS — E119 Type 2 diabetes mellitus without complications: Secondary | ICD-10-CM

## 2022-07-22 ENCOUNTER — Encounter: Admission: RE | Payer: Self-pay | Source: Home / Self Care

## 2022-07-22 ENCOUNTER — Ambulatory Visit (INDEPENDENT_AMBULATORY_CARE_PROVIDER_SITE_OTHER): Payer: Commercial Managed Care - HMO | Admitting: Internal Medicine

## 2022-07-22 ENCOUNTER — Encounter: Payer: Self-pay | Admitting: Internal Medicine

## 2022-07-22 ENCOUNTER — Ambulatory Visit
Admission: RE | Admit: 2022-07-22 | Payer: Commercial Managed Care - HMO | Source: Home / Self Care | Admitting: Orthopedic Surgery

## 2022-07-22 VITALS — BP 132/64 | HR 89 | Temp 98.0°F | Ht 64.0 in | Wt 143.6 lb

## 2022-07-22 DIAGNOSIS — R051 Acute cough: Secondary | ICD-10-CM

## 2022-07-22 DIAGNOSIS — H6690 Otitis media, unspecified, unspecified ear: Secondary | ICD-10-CM | POA: Diagnosis not present

## 2022-07-22 LAB — POC COVID19 BINAXNOW: SARS Coronavirus 2 Ag: NEGATIVE

## 2022-07-22 LAB — POCT INFLUENZA A/B
Influenza A, POC: NEGATIVE
Influenza B, POC: NEGATIVE

## 2022-07-22 SURGERY — ARTHROSCOPY, SHOULDER
Anesthesia: Choice | Site: Shoulder | Laterality: Left

## 2022-07-22 MED ORDER — AMOXICILLIN-POT CLAVULANATE 875-125 MG PO TABS: 1.0000 | ORAL_TABLET | Freq: Two times a day (BID) | ORAL | 0 refills | Status: DC

## 2022-07-22 MED ORDER — PREDNISONE 10 MG PO TABS: ORAL_TABLET | ORAL | 0 refills | Status: DC

## 2022-07-22 MED ORDER — BENZONATATE 200 MG PO CAPS: 200.0000 mg | ORAL_CAPSULE | Freq: Three times a day (TID) | ORAL | 1 refills | Status: DC | PRN

## 2022-07-22 NOTE — Progress Notes (Signed)
Subjective:  Patient ID: Melissa Garrison, female    DOB: 01-31-1961  Age: 62 y.o. MRN: [id number]  CC: The primary encounter diagnosis was Acute cough. A diagnosis of Acute otitis media, unspecified otitis media type was also pertinent to this visit.  RI  Chief Complaint  Patient presents with   Cough    Cough, congestion, runny nose, sinus pain and pressure   "HIT ME FRIDAY NIGHT" with cough , rhinitis, conjunctivitis without fevers,  n/v.  Symptoms were severe enough initially to discourage her from smoking  , but now currently smoking again.  Taking otc cough syrup.  No chest pain ,  reports some pressure across bridge of nose,  ears  feel;like they are under water/ pressure,  full.    Has been expereiencing vertigo on and off with position change since Sunday   Outpatient Medications Prior to Visit  Medication Sig Dispense Refill   acetaminophen (TYLENOL) 650 MG CR tablet Take 650 mg by mouth daily as needed for pain.     amLODipine (NORVASC) 5 MG tablet Take 1 tablet (5 mg total) by mouth daily. 90 tablet 3   glucose blood (ONETOUCH VERIO) test strip Used to check blood sugars once a day. 100 each 3   lisinopril (ZESTRIL) 40 MG tablet Take 1 tablet (40 mg total) by mouth daily. 90 tablet 1   metFORMIN (GLUCOPHAGE-XR) 500 MG 24 hr tablet TAKE 2 TABLETS BY MOUTH IN THE MORNING AND 2 AT BEDTIME 360 tablet 0   OneTouch Delica Lancets 55D MISC Used to check blood sugar once daily. 200 each 3   rosuvastatin (CRESTOR) 10 MG tablet Take 1 tablet daily three days per week (Patient taking differently: Take 10 mg by mouth 3 (three) times a week. Take 1 tablet daily three days per week M-W-Fr) 48 tablet 2   No facility-administered medications prior to visit.    Review of Systems;  Patient denies headache, fevers, malaise, unintentional weight loss, skin rash, eye pain, sinus congestion and sinus pain, sore throat, dysphagia,  hemoptysis , cough, dyspnea, wheezing, chest pain, palpitations,  orthopnea, edema, abdominal pain, nausea, melena, diarrhea, constipation, flank pain, dysuria, hematuria, urinary  Frequency, nocturia, numbness, tingling, seizures,  Focal weakness, Loss of consciousness,  Tremor, insomnia, depression, anxiety, and suicidal ideation.      Objective:  BP 132/64   Pulse 89   Temp 98 F (36.7 C) (Oral)   Ht '5\' 4"'$  (1.626 m)   Wt 143 lb 9.6 oz (65.1 kg)   SpO2 99%   BMI 24.65 kg/m   BP Readings from Last 3 Encounters:  07/22/22 132/64  07/14/22 (!) 150/87  05/20/22 115/69    Wt Readings from Last 3 Encounters:  07/22/22 143 lb 9.6 oz (65.1 kg)  07/14/22 145 lb (65.8 kg)  05/20/22 150 lb (68 kg)    Physical Exam Vitals reviewed.  Constitutional:      General: She is not in acute distress.    Appearance: Normal appearance. She is normal weight. She is not ill-appearing, toxic-appearing or diaphoretic.  HENT:     Head: Normocephalic.     Right Ear: A middle ear effusion is present. Tympanic membrane is erythematous.     Left Ear: Hearing, tympanic membrane and ear canal normal.     Ears:     Comments: Right TM dull , red    Nose: Nasal tenderness and congestion present.  Eyes:     General: No scleral icterus.  Right eye: No discharge.        Left eye: No discharge.     Conjunctiva/sclera: Conjunctivae normal.  Musculoskeletal:        General: Normal range of motion.  Skin:    General: Skin is warm and dry.  Neurological:     General: No focal deficit present.     Mental Status: She is alert and oriented to person, place, and time. Mental status is at baseline.  Psychiatric:        Mood and Affect: Mood normal.        Behavior: Behavior normal.        Thought Content: Thought content normal.        Judgment: Judgment normal.     Lab Results  Component Value Date   HGBA1C 6.6 (A) 05/13/2022   HGBA1C 7.8 (H) 01/23/2022   HGBA1C 11.3 (A) 10/18/2021    Lab Results  Component Value Date   CREATININE 0.57 07/14/2022    CREATININE 0.57 02/10/2022   CREATININE 0.68 10/18/2021    Lab Results  Component Value Date   WBC 8.0 07/14/2022   HGB 11.5 (L) 07/14/2022   HCT 33.6 (L) 07/14/2022   PLT 254 07/14/2022   GLUCOSE 119 (H) 07/14/2022   CHOL 162 01/23/2022   TRIG 95.0 01/23/2022   HDL 57.60 01/23/2022   LDLCALC 85 01/23/2022   ALT 14 10/18/2021   AST 25 10/18/2021   NA 136 07/14/2022   K 3.9 07/14/2022   CL 100 07/14/2022   CREATININE 0.57 07/14/2022   BUN 12 07/14/2022   CO2 25 07/14/2022   TSH 1.73 10/18/2021   HGBA1C 6.6 (A) 05/13/2022   MICROALBUR 2.8 (H) 01/23/2022    No results found.  Assessment & Plan:  .Acute cough -     Respiratory virus panel -     POC COVID-19 BinaxNow -     POCT Influenza A/B  Acute otitis media, unspecified otitis media type Assessment & Plan: Given chronicity of symptoms, development of left ear pain and  exam consistent with bacterial otitis ,  Will treat with empiric antibiotics, topical decongestants, and steroid taper . probiotic advised      Other orders -     Amoxicillin-Pot Clavulanate; Take 1 tablet by mouth 2 (two) times daily.  Dispense: 14 tablet; Refill: 0 -     predniSONE; 6 tablets on Day 1 , then reduce by 1 tablet daily until gone  Dispense: 21 tablet; Refill: 0 -     Benzonatate; Take 1 capsule (200 mg total) by mouth 3 (three) times daily as needed for cough.  Dispense: 60 capsule; Refill: 1    Follow-up: No follow-ups on file.   Crecencio Mc, MD

## 2022-07-22 NOTE — Patient Instructions (Addendum)
I am treating you for a sinus infection and an ear infection.     I am prescribing an antibiotic (Augmentin ) and a prednisone taper  To manage the infection and the inflammation in your ear/sinuses.   The prednisone should be taken as follows:   6 tablets all at once on Day 1 5 tablets all at once on Day 2 4 tablets all at once on Day 3 3 tablets all at once on Day 4  2 tablets all at once on Day 5 1 on Day 6 ______________________  I have also prescribed  benzonatate capsules   FOR THE COUGH. , or use OTC Coricidin, Robitussin or Delsym   I also advise use of the following OTC meds to help with your other symptoms.   Tylenol  for sinus pain   Afrin nasal spray for sinus congestion ( use the off label generic brand)  Gargle with salt water as needed for sore throat.

## 2022-07-22 NOTE — Assessment & Plan Note (Signed)
Given chronicity of symptoms, development of left ear pain and  exam consistent with bacterial otitis ,  Will treat with empiric antibiotics, decongestants, and steroid taper . probiotic advised

## 2022-07-24 ENCOUNTER — Other Ambulatory Visit: Payer: Self-pay | Admitting: Orthopedic Surgery

## 2022-07-24 LAB — RESPIRATORY VIRUS PANEL
Adenovirus B: NOT DETECTED
HUMAN PARAINFLU VIRUS 1: NOT DETECTED
HUMAN PARAINFLU VIRUS 2: NOT DETECTED
HUMAN PARAINFLU VIRUS 3: NOT DETECTED
INFLUENZA A SUBTYPE H1: NOT DETECTED
INFLUENZA A SUBTYPE H3: NOT DETECTED
Influenza A: NOT DETECTED
Influenza B: NOT DETECTED
Metapneumovirus: DETECTED — AB
Respiratory Syncytial Virus A: NOT DETECTED
Respiratory Syncytial Virus B: NOT DETECTED
Rhinovirus: NOT DETECTED

## 2022-08-05 ENCOUNTER — Encounter: Payer: Self-pay | Admitting: Orthopedic Surgery

## 2022-08-06 ENCOUNTER — Encounter: Payer: Self-pay | Admitting: Orthopedic Surgery

## 2022-08-08 NOTE — Anesthesia Preprocedure Evaluation (Signed)
Anesthesia Evaluation  Patient identified by MRN, date of birth, ID band Patient awake    Reviewed: Allergy & Precautions, H&P , NPO status , Patient's Chart, lab work & pertinent test results  Airway Mallampati: III  TM Distance: >3 FB Neck ROM: full    Dental  (+) Missing, Partial Upper   Pulmonary Current SmokerPatient did not abstain from smoking.   Pulmonary exam normal        Cardiovascular hypertension, Normal cardiovascular exam     Neuro/Psych negative neurological ROS  negative psych ROS   GI/Hepatic negative GI ROS, Neg liver ROS,,,  Endo/Other  diabetes, Well Controlled, Type 2    Renal/GU negative Renal ROS  negative genitourinary   Musculoskeletal   Abdominal Normal abdominal exam  (+)   Peds  Hematology negative hematology ROS (+)   Anesthesia Other Findings Past Medical History: 07/11/2011: Allergic rhinitis No date: Diabetes mellitus without complication ([place]) No date: Hepatitis C     Comment:  Dx: 2011 Dr Jerilee Hoh STATES SHE WENT THROUGH TREATMENT              AND STATES HEP C IS NOW RESOLVED  07/11/2011: Hepatitis C, chronic ([place]) No date: Hyperlipidemia 07/11/2011: Hypertension No date: IV drug abuse ([place])     Comment:  HISTORY - none since before 2000 No date: Vitamin D deficiency No date: Wears dentures     Comment:  partial upper  Past Surgical History: 12/05/2015: BREAST BIOPSY; Left     Comment:  stereotactic biopsy- benign 07/02/2021: CAPSULAR RELEASE; Left     Comment:  Procedure: CAPSULAR RELEASE;  Surgeon: Leim Fabry, MD;              Location: [place];  Service: Orthopedics;                Laterality: Left; 2011: CHOLECYSTECTOMY 07/02/2021: CLOSED MANIPULATION SHOULDER WITH STERIOD INJECTION; Left     Comment:  Procedure: CLOSED MANIPULATION SHOULDER WITH STEROID               INJECTION;  Surgeon: Leim Fabry, MD;  Location: [place];  Service: Orthopedics;  Laterality: Left; 2017: COLONOSCOPY 05/20/2022: COLONOSCOPY WITH PROPOFOL; N/A     Comment:  Procedure: COLONOSCOPY WITH PROPOFOL;  Surgeon:               Lesly Rubenstein, MD;  Location: [facility];                Service: Endoscopy;  Laterality: N/A; No date: HUMERUS SURGERY; Left     Comment:  due to fracture has plate 03/20/2017: HYDRADENITIS EXCISION; Right     Comment:  Procedure: EXCISION HIDRADENITIS AXILLA;  Surgeon:               Robert Bellow, MD;  Location: [facility];  Service:               General;  Laterality: Right; No date: LAPAROSCOPIC HYSTERECTOMY     Comment:  for noncancerous reason for fibroids. Still has ovaries.              no cervix on exam 10/10/20 07/02/2021: LYSIS OF ADHESION; Left     Comment:  Procedure: LYSIS OF ADHESION;  Surgeon: Leim Fabry,               MD;  Location: [place];  Service:  Orthopedics;  Laterality: Left; 07/02/2021: SHOULDER ARTHROSCOPY; Left     Comment:  Procedure: ARTHROSCOPY SHOULDER;arthoscopic lysis of               adhesions, capsular release, and manipulation under               anesthesia with coricosteroid injection;  Surgeon: Leim Fabry, MD;  Location: [place];  Service:               Orthopedics;  Laterality: Left; 01/04/2021: SHOULDER ARTHROSCOPY WITH SUBACROMIAL DECOMPRESSION AND  OPEN ROTATOR C; Left     Comment:  Procedure: Left shoulder arthroscopic rotator cuff               repair  and subacromial decompression, and biceps               tenodesis;  Surgeon: Leim Fabry, MD;  Location: [facility]               ORS;  Service: Orthopedics;  Laterality: Left;  BMI    Body Mass Index: 25.75 kg/m      Reproductive/Obstetrics negative OB ROS                             Anesthesia Physical Anesthesia Plan  ASA: 2  Anesthesia Plan: General   Post-op Pain Management: Regional block   Induction:  Intravenous  PONV Risk Score and Plan: 2 and Ondansetron, Dexamethasone and Midazolam  Airway Management Planned: Oral ETT  Additional Equipment:   Intra-op Plan:   Post-operative Plan:   Informed Consent: I have reviewed the patients History and Physical, chart, labs and discussed the procedure including the risks, benefits and alternatives for the proposed anesthesia with the patient or authorized representative who has indicated his/her understanding and acceptance.     Dental Advisory Given  Plan Discussed with: CRNA and Surgeon  Anesthesia Plan Comments:        Anesthesia Quick Evaluation

## 2022-08-11 ENCOUNTER — Ambulatory Visit
Admission: RE | Admit: 2022-08-11 | Discharge: 2022-08-11 | Disposition: A | Discharge status: Home / Self Care | Payer: Commercial Managed Care - HMO | Attending: Orthopedic Surgery | Admitting: Orthopedic Surgery

## 2022-08-11 ENCOUNTER — Other Ambulatory Visit: Payer: Self-pay | Admitting: Orthopedic Surgery

## 2022-08-11 ENCOUNTER — Encounter
Admission: RE | Disposition: A | Discharge status: Home / Self Care | Payer: Self-pay | Source: Home / Self Care | Attending: Orthopedic Surgery

## 2022-08-11 ENCOUNTER — Ambulatory Visit: Payer: Commercial Managed Care - HMO | Admitting: Anesthesiology

## 2022-08-11 ENCOUNTER — Other Ambulatory Visit: Payer: Self-pay

## 2022-08-11 DIAGNOSIS — I10 Essential (primary) hypertension: Secondary | ICD-10-CM | POA: Insufficient documentation

## 2022-08-11 DIAGNOSIS — Z981 Arthrodesis status: Secondary | ICD-10-CM | POA: Insufficient documentation

## 2022-08-11 DIAGNOSIS — B182 Chronic viral hepatitis C: Secondary | ICD-10-CM | POA: Diagnosis not present

## 2022-08-11 DIAGNOSIS — Z7984 Long term (current) use of oral hypoglycemic drugs: Secondary | ICD-10-CM | POA: Insufficient documentation

## 2022-08-11 DIAGNOSIS — E119 Type 2 diabetes mellitus without complications: Secondary | ICD-10-CM | POA: Diagnosis not present

## 2022-08-11 DIAGNOSIS — M7552 Bursitis of left shoulder: Secondary | ICD-10-CM | POA: Insufficient documentation

## 2022-08-11 DIAGNOSIS — F17210 Nicotine dependence, cigarettes, uncomplicated: Secondary | ICD-10-CM | POA: Insufficient documentation

## 2022-08-11 DIAGNOSIS — E785 Hyperlipidemia, unspecified: Secondary | ICD-10-CM | POA: Insufficient documentation

## 2022-08-11 DIAGNOSIS — Z9049 Acquired absence of other specified parts of digestive tract: Secondary | ICD-10-CM | POA: Insufficient documentation

## 2022-08-11 HISTORY — DX: Presence of dental prosthetic device (complete) (partial): Z97.2

## 2022-08-11 HISTORY — DX: Hyperlipidemia, unspecified: E78.5

## 2022-08-11 HISTORY — PX: CLOSED MANIPULATION SHOULDER WITH STERIOD INJECTION: SHX5611

## 2022-08-11 LAB — GLUCOSE, CAPILLARY: Glucose-Capillary: 148 mg/dL — ABNORMAL HIGH (ref 70–99)

## 2022-08-11 SURGERY — CLOSED MANIPULATION SHOULDER WITH STEROID INJECTION
Anesthesia: General | Site: Shoulder | Laterality: Left

## 2022-08-11 MED ORDER — CEFAZOLIN SODIUM-DEXTROSE 2-4 GM/100ML-% IV SOLN
2.0000 g | INTRAVENOUS | Status: AC
  Administered 2022-08-11: 2 g via INTRAVENOUS

## 2022-08-11 MED ORDER — IBUPROFEN 800 MG PO TABS: 800.0000 mg | ORAL_TABLET | Freq: Three times a day (TID) | ORAL | 0 refills | Status: AC

## 2022-08-11 MED ORDER — FENTANYL CITRATE (PF) 100 MCG/2ML IJ SOLN
INTRAMUSCULAR | Status: DC | PRN
  Administered 2022-08-11: 100 ug via INTRAVENOUS

## 2022-08-11 MED ORDER — ONDANSETRON HCL 4 MG/2ML IJ SOLN
INTRAMUSCULAR | Status: DC | PRN
  Administered 2022-08-11: 4 mg via INTRAVENOUS

## 2022-08-11 MED ORDER — ASPIRIN 325 MG PO TBEC: 325.0000 mg | DELAYED_RELEASE_TABLET | Freq: Every day | ORAL | 0 refills | Status: AC

## 2022-08-11 MED ORDER — ONDANSETRON 4 MG PO TBDP: 4.0000 mg | ORAL_TABLET | Freq: Three times a day (TID) | ORAL | 0 refills | Status: DC | PRN

## 2022-08-11 MED ORDER — BUPIVACAINE HCL (PF) 0.5 % IJ SOLN
INTRAMUSCULAR | Status: DC | PRN
  Administered 2022-08-11: 10 mL via PERINEURAL

## 2022-08-11 MED ORDER — RINGERS IRRIGATION IR SOLN
Status: DC | PRN
  Administered 2022-08-11: 12000 mL

## 2022-08-11 MED ORDER — EPINEPHRINE PF 1 MG/ML IJ SOLN
INTRAMUSCULAR | Status: DC | PRN
  Administered 2022-08-11: 4 mg

## 2022-08-11 MED ORDER — OXYCODONE HCL 5 MG PO TABS: 5.0000 mg | ORAL_TABLET | ORAL | 0 refills | Status: DC | PRN

## 2022-08-11 MED ORDER — LIDOCAINE HCL (CARDIAC) PF 100 MG/5ML IV SOSY
PREFILLED_SYRINGE | INTRAVENOUS | Status: DC | PRN
  Administered 2022-08-11: 100 mg via INTRAVENOUS

## 2022-08-11 MED ORDER — DEXAMETHASONE SODIUM PHOSPHATE 4 MG/ML IJ SOLN
INTRAMUSCULAR | Status: DC | PRN
  Administered 2022-08-11: 4 mg via INTRAVENOUS

## 2022-08-11 MED ORDER — TRIAMCINOLONE ACETONIDE 40 MG/ML IJ SUSP
INTRAMUSCULAR | Status: DC | PRN
  Administered 2022-08-11: 80 mg via INTRAMUSCULAR

## 2022-08-11 MED ORDER — BUPIVACAINE LIPOSOME 1.3 % IJ SUSP
INTRAMUSCULAR | Status: DC | PRN
  Administered 2022-08-11: 10 mL via PERINEURAL

## 2022-08-11 MED ORDER — ACETAMINOPHEN 500 MG PO TABS: 1000.0000 mg | ORAL_TABLET | Freq: Three times a day (TID) | ORAL | 2 refills | Status: DC

## 2022-08-11 MED ORDER — EPHEDRINE SULFATE (PRESSORS) 50 MG/ML IJ SOLN
INTRAMUSCULAR | Status: DC | PRN
  Administered 2022-08-11: 5 mg via INTRAVENOUS

## 2022-08-11 MED ORDER — FENTANYL CITRATE (PF) 100 MCG/2ML IJ SOLN
INTRAMUSCULAR | Status: DC | PRN
  Administered 2022-08-11: 50 ug via EPIDURAL

## 2022-08-11 MED ORDER — MIDAZOLAM HCL 5 MG/5ML IJ SOLN
INTRAMUSCULAR | Status: DC | PRN
  Administered 2022-08-11: 2 mg via INTRAVENOUS

## 2022-08-11 MED ORDER — SUCCINYLCHOLINE CHLORIDE 200 MG/10ML IV SOSY
PREFILLED_SYRINGE | INTRAVENOUS | Status: DC | PRN
  Administered 2022-08-11: 100 mg via INTRAVENOUS

## 2022-08-11 MED ORDER — BUPIVACAINE-EPINEPHRINE (PF) 0.5% -1:200000 IJ SOLN
INTRAMUSCULAR | Status: DC | PRN
  Administered 2022-08-11: 4 mL via PERINEURAL

## 2022-08-11 MED ORDER — PROPOFOL 10 MG/ML IV BOLUS
INTRAVENOUS | Status: DC | PRN
  Administered 2022-08-11: 110 mg via INTRAVENOUS

## 2022-08-11 MED ORDER — LIDOCAINE HCL (PF) 1 % IJ SOLN
INTRAMUSCULAR | Status: DC | PRN
  Administered 2022-08-11: 4 mL

## 2022-08-11 MED ORDER — LACTATED RINGERS IV SOLN: INTRAVENOUS | Status: DC

## 2022-08-11 SURGICAL SUPPLY — 40 items
ADPR IRR PORT MULTIBAG TUBE (MISCELLANEOUS) ×1
APL PRP STRL LF DISP 70% ISPRP (MISCELLANEOUS) ×1
BLADE SHAVER 4.5X7 STR FR (MISCELLANEOUS) ×1 IMPLANT
CANNULA PART THRD DISP 5.75X7 (CANNULA) ×1 IMPLANT
CHLORAPREP W/TINT 26 (MISCELLANEOUS) ×1 IMPLANT
COOLER POLAR GLACIER W/PUMP (MISCELLANEOUS) ×1 IMPLANT
COVER LIGHT HANDLE UNIVERSAL (MISCELLANEOUS) ×2 IMPLANT
DRAPE U-SHAPE 48X52 POLY STRL (PACKS) ×2 IMPLANT
ELECT REM PT RETURN 9FT ADLT (ELECTROSURGICAL)
ELECTRODE REM PT RTRN 9FT ADLT (ELECTROSURGICAL) ×1 IMPLANT
GAUZE SPONGE 4X4 12PLY STRL (GAUZE/BANDAGES/DRESSINGS) ×1 IMPLANT
GAUZE XEROFORM 1X8 LF (GAUZE/BANDAGES/DRESSINGS) ×1 IMPLANT
GLOVE SRG 8 PF TXTR STRL LF DI (GLOVE) ×1 IMPLANT
GLOVE SURG ENC MOIS LTX SZ7.5 (GLOVE) ×2 IMPLANT
GLOVE SURG ENC MOIS LTX SZ8 (GLOVE) ×1 IMPLANT
GLOVE SURG UNDER POLY LF SZ8 (GLOVE) ×2
GOWN STRL REUS W/ TWL LRG LVL3 (GOWN DISPOSABLE) ×1 IMPLANT
GOWN STRL REUS W/ TWL XL LVL3 (GOWN DISPOSABLE) ×1 IMPLANT
GOWN STRL REUS W/TWL LRG LVL3 (GOWN DISPOSABLE) ×2
GOWN STRL REUS W/TWL XL LVL3 (GOWN DISPOSABLE) ×1
IV LACTATED RINGER IRRG 3000ML (IV SOLUTION) ×4
IV LR IRRIG 3000ML ARTHROMATIC (IV SOLUTION) ×6 IMPLANT
KIT STABILIZATION SHOULDER (MISCELLANEOUS) ×1 IMPLANT
KIT TURNOVER KIT A (KITS) ×1 IMPLANT
MANIFOLD NEPTUNE II (INSTRUMENTS) ×2 IMPLANT
MASK FACE SPIDER DISP (MASK) ×1 IMPLANT
MAT ABSORB  FLUID 56X50 GRAY (MISCELLANEOUS) ×2
MAT ABSORB FLUID 56X50 GRAY (MISCELLANEOUS) ×2 IMPLANT
NDL HYPO 21X1.5 SAFETY (NEEDLE) IMPLANT
NEEDLE HYPO 21X1.5 SAFETY (NEEDLE) ×1 IMPLANT
PACK ARTHROSCOPY SHOULDER (MISCELLANEOUS) ×1 IMPLANT
PAD WRAPON POLAR SHDR XLG (MISCELLANEOUS) ×1 IMPLANT
SET Y ADAPTER MULIT-BAG IRRIG (MISCELLANEOUS) ×1 IMPLANT
SLING ARM M TX990204 (SOFTGOODS) IMPLANT
SYR 5ML LL (SYRINGE) ×1 IMPLANT
TAPE MICROFOAM 4IN (TAPE) ×1 IMPLANT
TUBING INFLOW SET DBFLO PUMP (TUBING) ×1 IMPLANT
TUBING OUTFLOW SET DBLFO PUMP (TUBING) ×1 IMPLANT
WAND WEREWOLF FLOW 90D (MISCELLANEOUS) ×1 IMPLANT
WRAPON POLAR PAD SHDR XLG (MISCELLANEOUS) ×1

## 2022-08-11 NOTE — Anesthesia Procedure Notes (Signed)
Procedure Name: Intubation Date/Time: 08/11/2022 1:28 PM  Performed by: Lilly Gasser, [place], CRNAPre-anesthesia Checklist: Patient identified, Patient being monitored, Timeout performed, Emergency Drugs available and Suction available Patient Re-evaluated:Patient Re-evaluated prior to induction Oxygen Delivery Method: Circle system utilized Preoxygenation: Pre-oxygenation with 100% oxygen Induction Type: IV induction Ventilation: Mask ventilation without difficulty Laryngoscope Size: Mac and 4 Grade View: Grade I Tube type: Oral Tube size: 7.0 mm Number of attempts: 1 Airway Equipment and Method: Stylet Placement Confirmation: ETT inserted through vocal cords under direct vision, positive ETCO2 and breath sounds checked- equal and bilateral Secured at: 22 cm Tube secured with: Tape Dental Injury: Teeth and Oropharynx as per pre-operative assessment

## 2022-08-11 NOTE — Discharge Instructions (Addendum)
Post-Op Instructions - Shoulder Capsular Release/Manipulation Under Anesthesia  1. Bracing: You should wear a sling for comfort only. Sling should NOT be worn longer than ~1 week.   2. Driving: No driving for 1 week post-op. Must be off narcotic pain medication.  3. Activity: No active lifting for ~2 weeks. Perform range of motion exercises DAILY at home and with physical therapy as prescribed.   4. Physical Therapy: This NEEDS to begin the day after surgery, and proceed ~6-12 weeks. This should be at least 3x/week.   5. Medications:  - You will be provided a prescription for narcotic pain medicine. After surgery, take 1-2 narcotic tablets every 4 hours if needed for severe pain.  - A prescription for anti-nausea medication will be provided in case the narcotic medicine causes nausea - take 1 tablet every 6 hours only if nauseated.   - Take tylenol 1000 mg (2 Extra Strength tablets or 3 regular strength) every 8 hours for pain.  May decrease or stop tylenol 5 days after surgery if you are having minimal pain. - Take ibuprofen 856m three times/day with food for at least two weeks every day. This will help reduce post-operative inflammation and swelling. Please call our offices if this causes any stomach/GI irritation.  - Take Aspirin 3298mdaily x 2 weeks to help prevent DVT/PE (Blood clots) - It is extremely important that you maintain appropriate blood glucose levels. High blood glucose levels after surgery can lead to scar tissue forming again. Please contact your primary care physician if you are having difficulty with this after surgery.   If you are taking prescription medication for anxiety, depression, insomnia, muscle spasm, chronic pain, or for attention deficit disorder, you are advised that you are at a higher risk of adverse effects with use of narcotics post-op, including narcotic addiction/dependence, depressed breathing, death. If you use non-prescribed substances: alcohol,  marijuana, cocaine, heroin, methamphetamines, etc., you are at a higher risk of adverse effects with use of narcotics post-op, including narcotic addiction/dependence, depressed breathing, death. You are advised that taking > 50 morphine milligram equivalents (MME) of narcotic pain medication per day results in twice the risk of overdose or death. For your prescription provided: oxycodone 5 mg - taking more than 6 tablets per day would result in > 50 morphine milligram equivalents (MME) of narcotic pain medication. Be advised that we will prescribe narcotics short-term, for acute post-operative pain only - 3 weeks for major operations such as shoulder repair/reconstruction surgeries.    6. Post-Op Appointment:  Your first post-op appointment will be ~1 week post-op.  7. Work or School: For most, but not all procedures, we advise staying out of work or school for at least 1 to 2 weeks in order to recover from the stress of surgery and to allow time for healing.   If you need a work or school note this can be provided.   Information for Discharge Teaching: EXPAREL (bupivacaine liposome injectable suspension)   Your surgeon or anesthesiologist gave you EXPAREL(bupivacaine) to help control your pain after surgery.  EXPAREL is a local anesthetic that provides pain relief by numbing the tissue around the surgical site. EXPAREL is designed to release pain medication over time and can control pain for up to 72 hours. Depending on how you respond to EXPAREL, you may require less pain medication during your recovery.  Possible side effects: Temporary loss of sensation or ability to move in the area where bupivacaine was injected. Nausea, vomiting, constipation Rarely, numbness and  tingling in your mouth or lips, lightheadedness, or anxiety may occur. Call your doctor right away if you think you may be experiencing any of these sensations, or if you have other questions regarding possible side  effects.  Follow all other discharge instructions given to you by your surgeon or nurse. Eat a healthy diet and drink plenty of water or other fluids.  If you return to the hospital for any reason within 96 hours following the administration of EXPAREL, it is important for health care providers to know that you have received this anesthetic. A teal colored band has been placed on your arm with the date, time and amount of EXPAREL you have received in order to alert and inform your health care providers. Please leave this armband in place for the full 96 hours following administration, and then you may remove the band.  PERIPHERAL NERVE BLOCK PATIENT INFORMATION  Your surgeon has requested a peripheral nerve block for your surgery. This anesthetic technique provides excellent post-operative pain relief for you in a safe and effective manner. It will also help reduce the risk of nausea and vomiting and allow earlier discharge from the hospital.   The block is performed under sedation with ultrasound guidance prior to your procedure. Due to the sedation, your may or may not remember the block experience. The nerve block will begin to take effect anywhere from 5 to 30 minutes after being administered. You will be transported to the operating room from your surgery after the block is completed.   At the end of surgery, when the anesthesia wears off, you will notice a few things. Your may not be able to move or feel the part of your body targeted by the nerve block. These are normal experiences, and they will disappear as the block wears off.  If you had an interscalene nerve block performed (which is common for shoulder surgery), your voice can be very hoarse and you may feel that you are not able to take as deep a breath as you did before surgery. Some patients may also notice a droopy eyelid on the affected side. These symptoms will resolve once the block wears off.  Pain control: The nerve block technique  used is a single injection that can last anywhere from 1-3 days. The duration of the numbness can vary between individuals. After leaving the hospital, it is important that you begin to take your prescribed pain medication when you start to sense the nerve block wearing off. This will help you avoid unpleasant pain at the time the nerve block wears off, which can sometimes be in the middle of the night. The block will only cover pain in the areas targeted by the nerve block so if you experience surgical pain outside of that area, please take your prescribed pain medication. Management of the "numb area": After a nerve block, you cannot feel pain, pressure, or temperature in the affected area so there is an increased risk for injury. You should take extra care to protect the affected areas until sensation and movement returns. Please take caution to not come in contact with extremely hot or cold items because you will not be able to sense or protect yourself form the extremes of temperature.  You may experience some persistent numbness after the procedure by most neurological deficits resolve over time and the incidence of serious long term neurological complications attributable to peripheral nerve blocks are relatively uncommon.    POLAR CARE INFORMATION  http://jones.com/  How  to use [facility]?  YouTube   BargainHeads.tn  OPERATING INSTRUCTIONS  Start the product With dry hands, connect the transformer to the electrical connection located on the top of the cooler. Next, plug the transformer into an appropriate electrical outlet. The unit will automatically start running at this point.  To stop the pump, disconnect electrical power.  Unplug to stop the product when not in use. Unplugging the Polar Care unit turns it off. Always unplug immediately after use. Never leave it plugged in while unattended. Remove pad.    FIRST ADD WATER TO FILL  LINE, THEN ICE---Replace ice when existing ice is almost melted  1 Discuss Treatment with your [place] Practitioner and Use Only as Prescribed 2 Apply Insulation Barrier & Cold Therapy Pad 3 Check for Moisture 4 Inspect Skin Regularly  Tips and Trouble Shooting Usage Tips 1. Use cubed or chunked ice for optimal performance. 2. It is recommended to drain the Pad between uses. To drain the pad, hold the Pad upright with the hose pointed toward the ground. Depress the black plunger and allow water to drain out. 3. You may disconnect the Pad from the unit without removing the pad from the affected area by depressing the silver tabs on the hose coupling and gently pulling the hoses apart. The Pad and unit will seal itself and will not leak. Note: Some dripping during release is normal. 4. DO NOT RUN PUMP WITHOUT WATER! The pump in this unit is designed to run with water. Running the unit without water will cause permanent damage to the pump. 5. Unplug unit before removing lid.  TROUBLESHOOTING GUIDE Pump not running, Water not flowing to the pad, Pad is not getting cold 1. Make sure the transformer is plugged into the wall outlet. 2. Confirm that the ice and water are filled to the indicated levels. 3. Make sure there are no kinks in the pad. 4. Gently pull on the blue tube to make sure the tube/pad junction is straight. 5. Remove the pad from the treatment site and ll it while the pad is lying at; then reapply. 6. Confirm that the pad couplings are securely attached to the unit. Listen for the double clicks (Figure 1) to confirm the pad couplings are securely attached.  Leaks    Note: Some condensation on the lines, controller, and pads is unavoidable, especially in warmer climates. 1. If using a [facility] Polar Care Cold Therapy unit with a detachable Cold Therapy Pad, and a leak exists (other than condensation on the lines) disconnect the pad couplings. Make sure the silver tabs on the  couplings are depressed before reconnecting the pad to the pump hose; then confirm both sides of the coupling are properly clicked in. 2. If the coupling continues to leak or a leak is detected in the pad itself, stop using it and call [facility] at [phone].  Cleaning After use, empty and dry the unit with a soft cloth. Warm water and mild detergent may be used occasionally to clean the pump and tubes.  WARNING: The [place] can be cold enough to cause serious injury, including full skin necrosis. Follow these Operating Instructions, and carefully read the Product Insert (see pouch on side of unit) and the Cold Therapy Pad Fitting Instructions (provided with each Cold Therapy Pad) prior to use.

## 2022-08-11 NOTE — Anesthesia Postprocedure Evaluation (Signed)
Anesthesia Post Note  Patient: Eddye Wiesenfeld  Procedure(s) Performed: Left shoulder arthroscopic lysis of adhesions, capsular release, and manipulation under anesthesia with corticosteroid injection (Left: Shoulder)  Patient location during evaluation: PACU Anesthesia Type: General Level of consciousness: awake and alert Pain management: pain level controlled Vital Signs Assessment: post-procedure vital signs reviewed and stable Respiratory status: spontaneous breathing, nonlabored ventilation and respiratory function stable Cardiovascular status: blood pressure returned to baseline and stable Postop Assessment: no apparent nausea or vomiting Anesthetic complications: no   No notable events documented.   Last Vitals:  Vitals:   08/11/22 1500 08/11/22 1510  BP: (!) 146/76 (!) 144/77  Pulse: 93 87  Resp: 18 (!) 22  Temp:  36.7 C  SpO2: 100% 94%    Last Pain:  Vitals:   08/11/22 1510  TempSrc:   PainSc: 0-No pain                 [place] Ouch

## 2022-08-11 NOTE — Op Note (Signed)
OPERATIVE NOTE SURGERY DATE: 08/11/2022    PRE-OP DIAGNOSIS: 1. Left shoulder postoperative arthrofibrosis 2. Left subacromial bursitis   POST-OP DIAGNOSIS:  1. Left shoulder postoperative arthrofibrosis 2. Left subacromial bursitis   PROCEDURES: 1. Left shoulder capsular release, lysis of adhesions, manipulation under anesthesia  2. Left subacromial decompression without acromioplasty  3. Left shoulder extensive glenohumeral debridement 4. Left glenohumeral and subacromial injections with corticosteroid   SURGEON:  Cato Mulligan, MD   ASSISTANT(S):  Linward Natal, PA-S     ANESTHESIA: Regional block with Exparel, Gen   TOTAL IV FLUIDS: per anesthesia record    ESTIMATED BLOOD LOSS: Minimal   DRAINS:  None.   SPECIMENS: None   IMPLANTS: None.   COMPLICATIONS: none   INDICATIONS: Melissa Garrison is a 62 y.o. female with a history of prior left arthroscopic rotator cuff repair, biceps tenodesis, subacromial decompression by me on 01/04/2021 followed by L shoulder capsular release and manipulation under anesthesia by me on 07/02/21. She did well initially, but developed recurrent arthrofibrosis postoperatively despite physical therapy and corticosteroid injections. Preoperative left shoulder examination was notable for significant motion loss and pain. Surgery was recommended for capsular releases, manipulation under anesthesia, and subacromial decompression/bursectomy with corticosteroid injections into glenohumeral joint and subacromial space. After discussion of risks, benefits, and alternatives to surgery, the patient elected to proceed.     OPERATIVE FINDINGS:   Operative Shoulder Range of Motion:   Preop  Postop  Flexion  120 150  Abduction  90 120  ER at 0  40 70  ER at 90  85 105  IR at 90  20 50  IR posterior  T10 T6      Intra-operative findings: A thorough arthroscopic examination of the shoulder was performed.  The findings are: 1. Biceps tendon: Not  visualized in the joint consistent with prior biceps tenodesis 2. Superior labrum: injected with surrounding synovitis  3. Posterior labrum and capsule: Synovitic posterior capsule 4. Inferior capsule and inferior recess: Significant synovitis and thickening of capsule 5. Glenoid cartilage surface: Normal 6. Supraspinatus attachment: Healed prior supraspinatus repair 7. Posterior rotator cuff attachment:  Normal 8. Humeral head articular cartilage: scattered, small focal areas of grade 2 degenerative changes 9. Rotator interval: significant synovitis and thickening of capsule 10: Subscapularis tendon:  Normal with significant scar tissue surrounding tendon 11. Anterior labrum: Mildly degenerative 12. IGHL: significant synovitis around IGHL     DETAILS OF PROCEDURE: The patient was identified in the preoperative holding area. Informed consent was obtained. Operative extremity was marked. After satisfactory upper extremity regional block with Exparel was performed in the preoperative holding area, the patient was brought to the operating room and placed in a well-padded beach chair positioner.  Eyes were protected, head was affixed in neutral, and the patient was given preoperative IV antibiotics within 30 minutes of the start of the case and a surgical time-out occurred. The upper extremity was prescrubbed with Hibiclens and alcohol, prepped with ChloraPrep and draped in the usual sterile fashion.     I used the prior incisions for this procedure.  A posterior portal was made first with an 11 blade. The glenohumeral joint was easily entered with a blunt trochar and the arthroscope introduced. The findings of diagnostic arthroscopy are described above.  A standard anterior portal was made.   The joint was remarkable for significant synovitis which was chronic in the anterior, superior, posterior, and inferior aspects. This required synovectomy with a shaver, arthroscopic biter, and Arthrocare device  in the affected compartments listed above.  A combination of electrocautery and oscillating shaver was used to debride the rotator interval tissue.  The anterior labrum and superior labrum was debrided with an oscillating shaver and Arthrocare wand. The posterior aspect of the coracoid was exposed.  The anterior and posterior aspects of the subscapularis were cleared of tissue so there was no tethering.  Next, an upbiting duckbill basket was then used to perform a capsulotomy of the rotator interval and then the MGHL and the IGHL (anterior band).  Care was taken to protect the intraarticular subscapularis.  Adhesions were cleared off the subscapularis to allow full internal and external rotation.     The arthroscope was placed into the anterior portal.  The posterior capsule was quite thickened and inflamed as well.  After synovectomy, the duckbill basket was used to perform release from the superior glenoid, down into the axillary pouch, around to the anterior band of the IGHL.  A complete 360 degree capsulotomy was performed in this manner.  Care was taken to protect the axillary nerve by staying on the glenoid side and making sure not to rotate the shoulder externally during the capsulotomy.  Hemostasis was achieved with the ArthroCare wand.  There was no unusual bleeding.    The arthroscope was then placed in the subacromial space. An accessory lateral portal was established. There was moderate scar and bursitis in the subacromial space and gutters.  A complete subacromial bursectomy and debridement of the gutters was carried out with a shaver.  ArthroCare was used to control bleeding.  The rotator cuff repair was noted to be intact.   Next, manipulation under anesthesia was carried out in a gentle, controlled manner with short lever arms. See above chart for post-manipulation improvement in range of motion.    The skin was closed with interrupted 3-0 nylon sutures. Injections of 40 mg Kenalog with 1%  lidocaine and 0.5% ropivacaine were placed separately in the glenohumeral joint and subacromial space with a spinal needle.   Xeroform gauze, sterile dressings were applied. The patient was placed in a shoulder sling.  Polar Care was applied.     Instrument, sponge, and needle counts were correct prior to closure and at the conclusion of the case.    DISPOSITION: PACU - hemodynamically stable.   POSTOPERATIVE PLAN: The patient will be discharged home. PT to begin 1 day postop for range of motion exercises.  ASA x 2 weeks for DVT ppx. Sling only for comfort and wean this week as soon as tolerated. RTC in ~1-2 weeks.

## 2022-08-11 NOTE — Transfer of Care (Signed)
Immediate Anesthesia Transfer of Care Note  Patient: Melissa Garrison  Procedure(s) Performed: Left shoulder arthroscopic lysis of adhesions, capsular release, and manipulation under anesthesia with corticosteroid injection (Left: Shoulder)  Patient Location: PACU  Anesthesia Type: General  Level of Consciousness: awake, alert  and patient cooperative  Airway and Oxygen Therapy: Patient Spontanous Breathing and Patient connected to supplemental oxygen  Post-op Assessment: Post-op Vital signs reviewed, Patient's Cardiovascular Status Stable, Respiratory Function Stable, Patent Airway and No signs of Nausea or vomiting  Post-op Vital Signs: Reviewed and stable  Complications: No notable events documented.

## 2022-08-11 NOTE — H&P (Signed)
Paper H&P to be scanned into permanent record. H&P reviewed. No significant changes noted.  

## 2022-08-11 NOTE — Progress Notes (Signed)
Assisted Johnson ANMD with left, interscalene , ultrasound guided block. Side rails up, monitors on throughout procedure. See vital signs in flow sheet. Tolerated Procedure well.

## 2022-08-11 NOTE — Anesthesia Procedure Notes (Signed)
Anesthesia Regional Block: Interscalene brachial plexus block   Pre-Anesthetic Checklist: , timeout performed,  Correct Patient, Correct Site, Correct Laterality,  Correct Procedure, Correct Position, site marked,  Risks and benefits discussed,  Surgical consent,  Pre-op evaluation,  At surgeon's request and post-op pain management  Laterality: Left  Prep: chloraprep       Needles:  Injection technique: Single-shot  Needle Type: Stimiplex     Needle Length: 9cm  Needle Gauge: 22     Additional Needles:   Procedures:,,,, ultrasound used (permanent image in chart),,    Narrative:  Start time: 08/11/2022 12:50 PM End time: 08/11/2022 12:56 PM Injection made incrementally with aspirations every 5 mL.  Performed by: Personally  Anesthesiologist: Iran Ouch, MD  Additional Notes: Patient consented for risk and benefits of nerve block including but not limited to nerve damage, failed block, bleeding and infection.  Patient voiced understanding.  Functioning IV was confirmed and monitors were applied.  Timeout done prior to procedure and prior to any sedation being given to the patient.  Patient confirmed procedure site prior to any sedation given to the patient. Sterile prep,hand hygiene and sterile gloves were used.  Minimal sedation used for procedure.  No paresthesia endorsed by patient during the procedure.  Negative aspiration and negative test dose prior to incremental administration of local anesthetic. The patient tolerated the procedure well with no immediate complications.

## 2022-08-12 ENCOUNTER — Encounter: Payer: Self-pay | Admitting: Orthopedic Surgery

## 2022-08-13 ENCOUNTER — Encounter: Payer: Self-pay | Admitting: Family

## 2022-08-13 ENCOUNTER — Other Ambulatory Visit: Payer: Self-pay

## 2022-08-13 ENCOUNTER — Ambulatory Visit: Payer: Commercial Managed Care - HMO | Admitting: Family

## 2022-08-13 VITALS — BP 128/72 | HR 72 | Temp 98.2°F | Ht 64.0 in | Wt 150.2 lb

## 2022-08-13 DIAGNOSIS — E119 Type 2 diabetes mellitus without complications: Secondary | ICD-10-CM | POA: Diagnosis not present

## 2022-08-13 DIAGNOSIS — R899 Unspecified abnormal finding in specimens from other organs, systems and tissues: Secondary | ICD-10-CM | POA: Diagnosis not present

## 2022-08-13 DIAGNOSIS — I10 Essential (primary) hypertension: Secondary | ICD-10-CM

## 2022-08-13 DIAGNOSIS — R7309 Other abnormal glucose: Secondary | ICD-10-CM

## 2022-08-13 DIAGNOSIS — Z1231 Encounter for screening mammogram for malignant neoplasm of breast: Secondary | ICD-10-CM

## 2022-08-13 LAB — CBC WITH DIFFERENTIAL/PLATELET
Basophils Absolute: 0 10*3/uL (ref 0.0–0.1)
Basophils Relative: 0.2 % (ref 0.0–3.0)
Eosinophils Absolute: 0 10*3/uL (ref 0.0–0.7)
Eosinophils Relative: 0.1 % (ref 0.0–5.0)
HCT: 34.8 % — ABNORMAL LOW (ref 36.0–46.0)
Hemoglobin: 11.9 g/dL — ABNORMAL LOW (ref 12.0–15.0)
Lymphocytes Relative: 12.9 % (ref 12.0–46.0)
Lymphs Abs: 1.4 10*3/uL (ref 0.7–4.0)
MCHC: 34.3 g/dL (ref 30.0–36.0)
MCV: 91.8 fl (ref 78.0–100.0)
Monocytes Absolute: 0.4 10*3/uL (ref 0.1–1.0)
Monocytes Relative: 3.7 % (ref 3.0–12.0)
Neutro Abs: 9 10*3/uL — ABNORMAL HIGH (ref 1.4–7.7)
Neutrophils Relative %: 83.1 % — ABNORMAL HIGH (ref 43.0–77.0)
Platelets: 273 10*3/uL (ref 150.0–400.0)
RBC: 3.79 Mil/uL — ABNORMAL LOW (ref 3.87–5.11)
RDW: 15.2 % (ref 11.5–15.5)
WBC: 10.8 10*3/uL — ABNORMAL HIGH (ref 4.0–10.5)

## 2022-08-13 LAB — POCT GLYCOSYLATED HEMOGLOBIN (HGB A1C): Hemoglobin A1C: 7 % — AB (ref 4.0–5.6)

## 2022-08-13 LAB — B12 AND FOLATE PANEL
Folate: 7.4 ng/mL (ref >5.9)
Vitamin B-12: 609 pg/mL (ref 211–911)

## 2022-08-13 MED ORDER — LISINOPRIL 40 MG PO TABS: 40.0000 mg | ORAL_TABLET | Freq: Every day | ORAL | 3 refills | Status: DC

## 2022-08-13 MED ORDER — ROSUVASTATIN CALCIUM 10 MG PO TABS: ORAL_TABLET | ORAL | 2 refills | Status: DC

## 2022-08-13 NOTE — Assessment & Plan Note (Signed)
Chronic, stable.  Continue amlodipine 5 mg QD, lisinopril 40 mg QD

## 2022-08-13 NOTE — Progress Notes (Signed)
Assessment & Plan:  Diabetes mellitus without complication [facility]) Assessment & Plan: Lab Results  Component Value Date   HGBA1C 7.0 (A) 08/13/2022   Increased from prior.  Discussed dietary discretion and ways to reduce carbohydrates.  Counseled on lower carb options.  We agreed for now to continue metformin 1024m BID  Orders: -     Lipid panel  Elevated glucose -     POCT glycosylated hemoglobin (Hb A1C)  Encounter for screening mammogram for malignant neoplasm of breast  Abnormal laboratory test -     IBC + Ferritin -     B12 and Folate Panel -     Urinalysis, Routine w reflex microscopic -     CBC with Differential/Platelet  Primary hypertension Assessment & Plan: Chronic, stable.  Continue amlodipine 5 mg QD, lisinopril 40 mg QD      Return precautions given.   Risks, benefits, and alternatives of the medications and treatment plan prescribed today were discussed, and patient expressed understanding.   Education regarding symptom management and diagnosis given to patient on AVS either electronically or printed.  Return in about 3 months (around 11/11/2022).  MMable Paris FNP  Subjective:    Patient ID: Melissa Garrison female    DOB: [birth year] 62y.o.   MRN: [id number] CC: VNeenah Marchandis a 62y.o. female who presents today for follow up.   HPI: Feels well today.  No new complaints.  She endorses dietary indiscretion with increased carbohydrates.  She is compliant with medications.  Recent left rotator cuff surgery.  She is doing well and started physical therapy   Allergies: Patient has no known allergies. Current Outpatient Medications on File Prior to Visit  Medication Sig Dispense Refill   acetaminophen (TYLENOL) 500 MG tablet Take 2 tablets (1,000 mg total) by mouth every 8 (eight) hours. 90 tablet 2   amLODipine (NORVASC) 5 MG tablet Take 1 tablet (5 mg total) by mouth daily. 90 tablet 3   aspirin EC 325 MG tablet Take 1 tablet (325 mg  total) by mouth daily for 14 days. 14 tablet 0   glucose blood (ONETOUCH VERIO) test strip Used to check blood sugars once a day. 100 each 3   ibuprofen (ADVIL) 800 MG tablet Take 1 tablet (800 mg total) by mouth 3 (three) times daily for 14 days. 42 tablet 0   lisinopril (ZESTRIL) 40 MG tablet Take 1 tablet (40 mg total) by mouth daily. 90 tablet 1   metFORMIN (GLUCOPHAGE-XR) 500 MG 24 hr tablet TAKE 2 TABLETS BY MOUTH IN THE MORNING AND 2 AT BEDTIME 360 tablet 0   ondansetron (ZOFRAN-ODT) 4 MG disintegrating tablet Take 1 tablet (4 mg total) by mouth every 8 (eight) hours as needed for nausea or vomiting. 20 tablet 0   OneTouch Delica Lancets 399991111MISC Used to check blood sugar once daily. 200 each 3   oxyCODONE (ROXICODONE) 5 MG immediate release tablet Take 1-2 tablets (5-10 mg total) by mouth every 4 (four) hours as needed (pain). 30 tablet 0   rosuvastatin (CRESTOR) 10 MG tablet Take 1 tablet daily three days per week (Patient taking differently: Take 10 mg by mouth 3 (three) times a week. Take 1 tablet daily three days per week M-W-Fr) 48 tablet 2   benzonatate (TESSALON) 200 MG capsule Take 1 capsule (200 mg total) by mouth 3 (three) times daily as needed for cough. (Patient not taking: Reported on 08/05/2022) 60 capsule 1   No current facility-administered medications on  file prior to visit.    Review of Systems  Constitutional:  Negative for chills and fever.  Respiratory:  Negative for cough.   Cardiovascular:  Negative for chest pain and palpitations.  Gastrointestinal:  Negative for nausea and vomiting.      Objective:    BP 128/72   Pulse 72   Temp 98.2 F (36.8 C) (Oral)   Ht 5' 4"$  (1.626 m)   Wt 150 lb 3.2 oz (68.1 kg)   SpO2 99%   BMI 25.78 kg/m  BP Readings from Last 3 Encounters:  08/13/22 128/72  08/11/22 (!) 144/77  07/22/22 132/64   Wt Readings from Last 3 Encounters:  08/13/22 150 lb 3.2 oz (68.1 kg)  08/11/22 150 lb (68 kg)  07/22/22 143 lb 9.6 oz (65.1  kg)    Physical Exam Vitals reviewed.  Constitutional:      Appearance: She is well-developed.  Eyes:     Conjunctiva/sclera: Conjunctivae normal.  Cardiovascular:     Rate and Rhythm: Normal rate and regular rhythm.     Pulses: Normal pulses.     Heart sounds: Normal heart sounds.  Pulmonary:     Effort: Pulmonary effort is normal.     Breath sounds: Normal breath sounds. No wheezing, rhonchi or rales.  Skin:    General: Skin is warm and dry.  Neurological:     Mental Status: She is alert.  Psychiatric:        Speech: Speech normal.        Behavior: Behavior normal.        Thought Content: Thought content normal.

## 2022-08-13 NOTE — Addendum Note (Signed)
Addended by: Leeanne Rio on: 08/13/2022 11:20 AM   Modules accepted: Orders

## 2022-08-13 NOTE — Patient Instructions (Signed)
Please call  and schedule your 3D mammogram and /or bone density scan as we discussed.   [facility]  ( new location in 2023)  [address], [place], [place] [address]  [place], [place]  [phone]  Nice to see you!  

## 2022-08-13 NOTE — Assessment & Plan Note (Signed)
Lab Results  Component Value Date   HGBA1C 7.0 (A) 08/13/2022   Increased from prior.  Discussed dietary discretion and ways to reduce carbohydrates.  Counseled on lower carb options.  We agreed for now to continue metformin 1035m BID

## 2022-08-14 LAB — IBC + FERRITIN
Ferritin: 262.3 ng/mL (ref 10.0–291.0)
Iron: 107 ug/dL (ref 42–145)
Saturation Ratios: 24.6 % (ref 20.0–50.0)
TIBC: 435.4 ug/dL (ref 250.0–450.0)
Transferrin: 311 mg/dL (ref 212.0–360.0)

## 2022-08-14 LAB — LIPID PANEL
Cholesterol: 162 mg/dL (ref 0–200)
HDL: 69 mg/dL (ref >39.00)
LDL Cholesterol: 75 mg/dL (ref 0–99)
NonHDL: 93.39
Total CHOL/HDL Ratio: 2
Triglycerides: 93 mg/dL (ref 0.0–149.0)
VLDL: 18.6 mg/dL (ref 0.0–40.0)

## 2022-08-21 ENCOUNTER — Other Ambulatory Visit: Payer: Commercial Managed Care - HMO

## 2022-08-22 ENCOUNTER — Other Ambulatory Visit (INDEPENDENT_AMBULATORY_CARE_PROVIDER_SITE_OTHER): Payer: Commercial Managed Care - HMO

## 2022-08-22 DIAGNOSIS — R899 Unspecified abnormal finding in specimens from other organs, systems and tissues: Secondary | ICD-10-CM | POA: Diagnosis not present

## 2022-08-22 LAB — URINALYSIS, ROUTINE W REFLEX MICROSCOPIC
Bilirubin Urine: NEGATIVE
Hgb urine dipstick: NEGATIVE
Ketones, ur: NEGATIVE
Leukocytes,Ua: NEGATIVE
Nitrite: NEGATIVE
Specific Gravity, Urine: 1.02 (ref 1.000–1.030)
Total Protein, Urine: NEGATIVE
Urine Glucose: NEGATIVE
Urobilinogen, UA: 0.2 (ref 0.0–1.0)
pH: 5.5 (ref 5.0–8.0)

## 2022-10-28 ENCOUNTER — Telehealth: Payer: Self-pay | Admitting: Family

## 2022-10-28 DIAGNOSIS — I10 Essential (primary) hypertension: Secondary | ICD-10-CM

## 2022-10-28 MED ORDER — AMLODIPINE BESYLATE 5 MG PO TABS: 5.0000 mg | ORAL_TABLET | Freq: Every day | ORAL | 3 refills | Status: DC

## 2022-10-28 NOTE — Telephone Encounter (Signed)
Patient called back and note was read. 

## 2022-10-28 NOTE — Telephone Encounter (Signed)
Prescription Request  10/28/2022  LOV: 08/13/2022  What is the name of the medication or equipment? amLODipine (NORVASC) 5 MG tablet   Have you contacted your pharmacy to request a refill? No   Which pharmacy would you like this sent to?  [facility] - [place], [place] - [address] [place] [address] Phone: [phone] Fax: [phone]     Patient notified that their request is being sent to the clinical staff for review and that they should receive a response within 2 business days.   Please advise at [facility] [phone]

## 2022-10-28 NOTE — Telephone Encounter (Signed)
LVM to inform pt refill has been sent in to pharmacy

## 2022-11-11 ENCOUNTER — Ambulatory Visit (INDEPENDENT_AMBULATORY_CARE_PROVIDER_SITE_OTHER): Payer: Commercial Managed Care - HMO | Admitting: Family

## 2022-11-11 ENCOUNTER — Encounter: Payer: Self-pay | Admitting: Family

## 2022-11-11 VITALS — BP 138/78 | HR 88 | Temp 98.2°F | Ht 64.0 in | Wt 152.8 lb

## 2022-11-11 DIAGNOSIS — Z1231 Encounter for screening mammogram for malignant neoplasm of breast: Secondary | ICD-10-CM

## 2022-11-11 DIAGNOSIS — M542 Cervicalgia: Secondary | ICD-10-CM

## 2022-11-11 DIAGNOSIS — R7309 Other abnormal glucose: Secondary | ICD-10-CM

## 2022-11-11 DIAGNOSIS — E119 Type 2 diabetes mellitus without complications: Secondary | ICD-10-CM

## 2022-11-11 LAB — CBC WITH DIFFERENTIAL/PLATELET
Basophils Absolute: 0.1 10*3/uL (ref 0.0–0.1)
Basophils Relative: 0.6 % (ref 0.0–3.0)
Eosinophils Absolute: 0.1 10*3/uL (ref 0.0–0.7)
Eosinophils Relative: 0.5 % (ref 0.0–5.0)
HCT: 37.1 % (ref 36.0–46.0)
Hemoglobin: 12.4 g/dL (ref 12.0–15.0)
Lymphocytes Relative: 40.2 % (ref 12.0–46.0)
Lymphs Abs: 4.1 10*3/uL — ABNORMAL HIGH (ref 0.7–4.0)
MCHC: 33.4 g/dL (ref 30.0–36.0)
MCV: 90 fl (ref 78.0–100.0)
Monocytes Absolute: 0.6 10*3/uL (ref 0.1–1.0)
Monocytes Relative: 6.3 % (ref 3.0–12.0)
Neutro Abs: 5.4 10*3/uL (ref 1.4–7.7)
Neutrophils Relative %: 52.4 % (ref 43.0–77.0)
Platelets: 271 10*3/uL (ref 150.0–400.0)
RBC: 4.13 Mil/uL (ref 3.87–5.11)
RDW: 14.6 % (ref 11.5–15.5)
WBC: 10.2 10*3/uL (ref 4.0–10.5)

## 2022-11-11 LAB — POCT GLYCOSYLATED HEMOGLOBIN (HGB A1C): Hemoglobin A1C: 7.7 % — AB (ref 4.0–5.6)

## 2022-11-11 LAB — MICROALBUMIN / CREATININE URINE RATIO
Creatinine,U: 67.3 mg/dL
Microalb Creat Ratio: 14.2 mg/g (ref 0.0–30.0)
Microalb, Ur: 9.5 mg/dL — ABNORMAL HIGH (ref 0.0–1.9)

## 2022-11-11 MED ORDER — TRAMADOL HCL 50 MG PO TABS
50.0000 mg | ORAL_TABLET | Freq: Every day | ORAL | 2 refills | Status: DC | PRN
Start: 2022-11-11 — End: 2023-02-18

## 2022-11-11 MED ORDER — CYCLOBENZAPRINE HCL 5 MG PO TABS: 5.0000 mg | ORAL_TABLET | Freq: Three times a day (TID) | ORAL | 1 refills | Status: DC | PRN

## 2022-11-11 NOTE — Assessment & Plan Note (Addendum)
Concern for cervical stenosis based on prior MRI obtained in 2014.  This may also be aggravated by muscle spasm.   Patient is following Dr. Allena Katz.  Per patient, he is ordering cervical MRI therefore I did not order any imaging at this time.  Discussed options for pain management.  Advised to continue Tylenol 1000 mg every 8 hours.  I have sent in Flexeril 5 to 10 mg 3 times daily as needed.  We discussed tramadol 50 mg daily as needed as needed for moderate to severe pain.  Counseled on sedation of Flexeril and tramadol.  Advised to store both medications in a safe place.  Encouraged her to continue icing regimen.  She will let me know how she is doing. I looked up patient on [place] Controlled Substances Reporting System PMP AWARE and saw no activity that raised concern of inappropriate use.

## 2022-11-11 NOTE — Patient Instructions (Addendum)
Buy over the salon pas pain patch.   Continue tylenol and icing.   Trial of flexeril, muscle relaxant.   Do not drive or operate heavy machinery while on muscle relaxant. Please do not drink alcohol. Only take this medication as needed for acute muscle spasm at bedtime. This medication make you feel drowsy so be very careful.  Stop taking if become too drowsy or somnolent as this puts you at risk for falls. Please contact our office with any questions.    I have sent tramadol to use for more moderate pain. Please be careful and store in a safe  place.   Do not use tramadol with flexeril at the same time as of these medications can be sedating.  Please call  and schedule your 3D mammogram and /or bone density scan as we discussed.   [facility]  ( new location in 2023)  [address], [place], [place] [address]  [place], [place]  [phone]

## 2022-11-11 NOTE — Assessment & Plan Note (Signed)
Lab Results  Component Value Date   HGBA1C 7.7 (A) 11/11/2022   Increased from prior.  Discussed dietary discretion and ways to reduce carbohydrates.  We discussed recent prednisone which has likely aggravated.  We discussed Ozempic.  Patient politely declines adding on Ozempic at this time.  She would like to work more on her diet.

## 2022-11-11 NOTE — Progress Notes (Signed)
Assessment & Plan:  Diabetes mellitus without complication [facility]) Assessment & Plan: Lab Results  Component Value Date   HGBA1C 7.7 (A) 11/11/2022   Increased from prior.  Discussed dietary discretion and ways to reduce carbohydrates.  We discussed recent prednisone which has likely aggravated.  We discussed Ozempic.  Patient politely declines adding on Ozempic at this time.  She would like to work more on her diet.    Orders: -     Microalbumin / creatinine urine ratio -     CBC with Differential/Platelet  Elevated glucose -     POCT glycosylated hemoglobin (Hb A1C)  Encounter for screening mammogram for malignant neoplasm of breast -     3D Screening Mammogram, Left and Right; Future  Neck pain Assessment & Plan: Concern for cervical stenosis based on prior MRI obtained in 2014.  This may also be aggravated by muscle spasm.   Patient is following Dr. Allena Katz.  Per patient, he is ordering cervical MRI therefore I did not order any imaging at this time.  Discussed options for pain management.  Advised to continue Tylenol 1000 mg every 8 hours.  I have sent in Flexeril 5 to 10 mg 3 times daily as needed.  We discussed tramadol 50 mg daily as needed as needed for moderate to severe pain.  Counseled on sedation of Flexeril and tramadol.  Advised to store both medications in a safe place.  Encouraged her to continue icing regimen.  She will let me know how she is doing. I looked up patient on [place] Controlled Substances Reporting System PMP AWARE and saw no activity that raised concern of inappropriate use.    Orders: -     Cyclobenzaprine HCl; Take 1-2 tablets (5-10 mg total) by mouth 3 (three) times daily as needed for muscle spasms.  Dispense: 90 tablet; Refill: 1 -     traMADol HCl; Take 1 tablet (50 mg total) by mouth daily as needed.  Dispense: 30 tablet; Refill: 2     Return precautions given.   Risks, benefits, and alternatives of the medications and treatment plan prescribed  today were discussed, and patient expressed understanding.   Education regarding symptom management and diagnosis given to patient on AVS either electronically or printed.  No follow-ups on file.  Rennie Plowman, FNP  Subjective:    Patient ID: Leeonna Pusateri, female    DOB: 11-03-1960, 62 y.o.   MRN: [id number]  CC: Shakia Bruch is a 62 y.o. female who presents today for follow up.   HPI: Overall feels well today.  Endorses dietary indiscretion.  She was eating more when she was on prednisone.  She is gaining weight.   She continues to follow with Dr Allena Katz , last seen 10/29/22, for left shoulder pain and neck pain. Given steroid injection at that visit with temporary relief.  She continues to have left upper back pain.  She describes it as a dull ache there all the time No numbness, vision changes, HA.  S/p left sided revision shoulder capsular release 10/08/22. She was on steroid taper 2 months ago from orthopedics.   She is tylenol 1000mg  every 8 hours.   No h/o seizure.   She is compliant with metformin.  No p personal or family h/o thyroid or endocrine cancer.   MRI cervical spine 07/17/2012 with mild foraminal narrowing C4-C5.  Allergies: Patient has no known allergies. Current Outpatient Medications on File Prior to Visit  Medication Sig Dispense Refill   acetaminophen (TYLENOL) 500  MG tablet Take 2 tablets (1,000 mg total) by mouth every 8 (eight) hours. 90 tablet 2   amLODipine (NORVASC) 5 MG tablet Take 1 tablet (5 mg total) by mouth daily. 90 tablet 3   glucose blood (ONETOUCH VERIO) test strip Used to check blood sugars once a day. 100 each 3   lisinopril (ZESTRIL) 40 MG tablet Take 1 tablet (40 mg total) by mouth daily. 90 tablet 3   metFORMIN (GLUCOPHAGE-XR) 500 MG 24 hr tablet TAKE 2 TABLETS BY MOUTH IN THE MORNING AND 2 AT BEDTIME 360 tablet 0   OneTouch Delica Lancets 33G MISC Used to check blood sugar once daily. 200 each 3   rosuvastatin (CRESTOR) 10 MG  tablet Take 1 tablet daily three days per week 48 tablet 2   No current facility-administered medications on file prior to visit.    Review of Systems  Constitutional:  Negative for chills and fever.  Respiratory:  Negative for cough.   Cardiovascular:  Negative for chest pain and palpitations.  Gastrointestinal:  Negative for nausea and vomiting.  Musculoskeletal:  Positive for arthralgias and back pain.  Neurological:  Negative for numbness and headaches.      Objective:    BP 138/78   Pulse 88   Temp 98.2 F (36.8 C) (Oral)   Ht 5\' 4"  (1.626 m)   Wt 152 lb 12.8 oz (69.3 kg)   SpO2 99%   BMI 26.23 kg/m  BP Readings from Last 3 Encounters:  11/11/22 138/78  08/13/22 128/72  08/11/22 (!) 144/77   Wt Readings from Last 3 Encounters:  11/11/22 152 lb 12.8 oz (69.3 kg)  08/13/22 150 lb 3.2 oz (68.1 kg)  08/11/22 150 lb (68 kg)    Physical Exam Vitals reviewed.  Constitutional:      Appearance: She is well-developed.  Eyes:     Conjunctiva/sclera: Conjunctivae normal.  Neck:   Cardiovascular:     Rate and Rhythm: Normal rate and regular rhythm.     Pulses: Normal pulses.     Heart sounds: Normal heart sounds.  Pulmonary:     Effort: Pulmonary effort is normal.     Breath sounds: Normal breath sounds. No wheezing, rhonchi or rales.  Musculoskeletal:     Cervical back: No signs of trauma or torticollis. Pain with movement and muscular tenderness present. No spinous process tenderness. Normal range of motion.  Skin:    General: Skin is warm and dry.  Neurological:     Mental Status: She is alert.  Psychiatric:        Speech: Speech normal.        Behavior: Behavior normal.        Thought Content: Thought content normal.

## 2022-12-17 ENCOUNTER — Other Ambulatory Visit: Payer: Self-pay | Admitting: Orthopedic Surgery

## 2022-12-17 DIAGNOSIS — M25512 Pain in left shoulder: Secondary | ICD-10-CM

## 2022-12-18 ENCOUNTER — Other Ambulatory Visit: Payer: Self-pay | Admitting: Orthopedic Surgery

## 2022-12-18 DIAGNOSIS — M5412 Radiculopathy, cervical region: Secondary | ICD-10-CM

## 2022-12-22 ENCOUNTER — Encounter: Payer: Self-pay | Admitting: Student in an Organized Health Care Education/Training Program

## 2022-12-22 ENCOUNTER — Ambulatory Visit
Payer: Commercial Managed Care - HMO | Attending: Student in an Organized Health Care Education/Training Program | Admitting: Student in an Organized Health Care Education/Training Program

## 2022-12-22 VITALS — BP 138/85 | HR 98 | Resp 16 | Ht 64.0 in | Wt 150.0 lb

## 2022-12-22 DIAGNOSIS — M75102 Unspecified rotator cuff tear or rupture of left shoulder, not specified as traumatic: Secondary | ICD-10-CM | POA: Diagnosis present

## 2022-12-22 DIAGNOSIS — Z9889 Other specified postprocedural states: Secondary | ICD-10-CM | POA: Diagnosis present

## 2022-12-22 DIAGNOSIS — M12812 Other specific arthropathies, not elsewhere classified, left shoulder: Secondary | ICD-10-CM | POA: Diagnosis present

## 2022-12-22 DIAGNOSIS — G8929 Other chronic pain: Secondary | ICD-10-CM | POA: Insufficient documentation

## 2022-12-22 DIAGNOSIS — M25512 Pain in left shoulder: Secondary | ICD-10-CM | POA: Insufficient documentation

## 2022-12-22 NOTE — Patient Instructions (Addendum)
______________________________________________________________________  Preparing for your procedure  Appointments: If you think you may not be able to keep your appointment, call 24-48 hours in advance to cancel. We need time to make it available to others.  During your procedure appointment there will be: No Prescription Refills. No disability issues to discussed. No medication changes or discussions.  Instructions: Food intake: Avoid eating anything solid for at least 8 hours prior to your procedure. Clear liquid intake: You may take clear liquids such as water up to 2 hours prior to your procedure. (No carbonated drinks. No soda.) Transportation: Unless otherwise stated by your physician, bring a driver. Morning Medicines: Except for blood thinners, take all of your other morning medications with a sip of water. Make sure to take your heart and blood pressure medicines. If your blood pressure's lower number is above 100, the case will be rescheduled. Blood thinners: Make sure to stop your blood thinners as instructed.  If you take a blood thinner, but were not instructed to stop it, call our office [phone] and ask to talk to a nurse. Not stopping a blood thinner prior to certain procedures could lead to serious complications. Diabetics on insulin: Notify the staff so that you can be scheduled 1st case in the morning. If your diabetes requires high dose insulin, take only  of your normal insulin dose the morning of the procedure and notify the staff that you have done so. Preventing infections: Shower with an antibacterial soap the morning of your procedure.  Build-up your immune system: Take 1000 mg of Vitamin C with every meal (3 times a day) the day prior to your procedure. Antibiotics: Inform the nursing staff if you are taking any antibiotics or if you have any conditions that may require antibiotics prior to procedures. (Example: recent joint implants)   Pregnancy: If you are  pregnant make sure to notify the nursing staff. Not doing so may result in injury to the fetus, including death.  Sickness: If you have a cold, fever, or any active infections, call and cancel or reschedule your procedure. Receiving steroids while having an infection may result in complications. Arrival: You must be in the facility at least 30 minutes prior to your scheduled procedure. Tardiness: Your scheduled time is also the cutoff time. If you do not arrive at least 15 minutes prior to your procedure, you will be rescheduled.  Children: Do not bring any children with you. Make arrangements to keep them home. Dress appropriately: There is always a possibility that your clothing may get soiled. Avoid long dresses. Valuables: Do not bring any jewelry or valuables.  Reasons to call and reschedule or cancel your procedure: (Following these recommendations will minimize the risk of a serious complication.) Surgeries: Avoid having procedures within 2 weeks of any surgery. (Avoid for 2 weeks before or after any surgery). Flu Shots: Avoid having procedures within 2 weeks of a flu shots or . (Avoid for 2 weeks before or after immunizations). Barium: Avoid having a procedure within 7-10 days after having had a radiological study involving the use of radiological contrast. (Myelograms, Barium swallow or enema study). Heart attacks: Avoid any elective procedures or surgeries for the initial 6 months after a "Myocardial Infarction" (Heart Attack). Blood thinners: It is imperative that you stop these medications before procedures. Let us know if you if you take any blood thinner.  Infection: Avoid procedures during or within two weeks of an infection (including chest colds or gastrointestinal problems). Symptoms associated with infections   include: Localized redness, fever, chills, night sweats or profuse sweating, burning sensation when voiding, cough, congestion, stuffiness, runny nose, sore throat, diarrhea,  nausea, vomiting, cold or Flu symptoms, recent or current infections. It is specially important if the infection is over the area that we intend to treat. Heart and lung problems: Symptoms that may suggest an active cardiopulmonary problem include: cough, chest pain, breathing difficulties or shortness of breath, dizziness, ankle swelling, uncontrolled high or unusually low blood pressure, and/or palpitations. If you are experiencing any of these symptoms, cancel your procedure and contact your primary care physician for an evaluation.  Remember:  Regular Business hours are:  Monday to Thursday 8:00 AM to 4:00 PM  Provider's Schedule: Delano Metz, MD:  Procedure days: Tuesday and Thursday 7:30 AM to 4:00 PM  Edward Jolly, MD:  Procedure days: Monday and Wednesday 7:30 AM to 4:00 PM  ______________________________________________________________________  Suprascapular Nerve Entrapment Suprascapular nerve entrapment is pressure or squeezing (entrapment) of the suprascapular nerve. This nerve provides feeling to the muscles near the shoulder blade (scapula) and shoulder joint. This nerve begins in the spinal cord in the neck and passes down the front of the neck, behind the collarbone (clavicle), and over the back of the scapula. Nerve entrapment can happen anywhere along the nerve, but it commonly happens: In the neck, near where the nerve leaves the spinal cord. At the top of the scapula, under strong tissue that connects bones to each other (ligament). Behind the scapula, where the nerve passes through a narrow area. This condition is common among athletes who often raise their arms overhead and rotate their shoulders outward, which is why it is sometimes called volleyball shoulder. Suprascapular nerve entrapment can lead to nerve damage in the shoulder (neuropathy). What are the causes?  This condition is commonly caused by narrowing of the tissues around the suprascapular nerve. This  may happen gradually from repeatedly making overhead arm motions or frequently carrying a heavy backpack. It may also result from a sudden (acute) injury such as a fall or getting hit in the area where the nerve is. Less commonly, this condition may be caused by a fluid-filled lump growing near the nerve (ganglion cyst) that causes the nerve to swell. What increases the risk? You are more likely to develop this condition if: You are a young, female athlete. You participate in sports that involve overhead arm movements. These sports include: Volleyball. Baseball. Tennis. Weight lifting. What are the signs or symptoms? Symptoms of this condition include: Pain. This may feel like a dull ache, and it may get worse with overhead arm movements. Weakness. Decreased range of motion. How is this diagnosed? This condition is diagnosed based on: Your symptoms. Your medical history, including your history of recent injuries. A physical exam to test your muscle strength and range of motion. Your body's response to a shot (injection) of numbing medicine in your shoulder. This may be done to see if the medicine helps relieve your shoulder pain. Tests, such as: Electromyogram (EMG). This is an electrical nerve study that is used to find out which nerve and muscles are affected. X-rays. MRI. Ultrasound. This test uses sound waves to take pictures of the inside of the body. How is this treated? Treatment for this condition may include: Avoiding activities that cause pain. Taking medicines to help relieve pain. Doing physical therapy. Having one or more injections of a numbing medicine (steroid) to help reduce inflammation and pain. Having surgery to remove a ganglion cyst or  enlarge a narrow area. Surgery may be needed if your condition does not improve after trying other treatment methods. Follow these instructions at home: Medicines Take over-the-counter and prescription medicines only as told by  your health care provider. Ask your provider if the medicine prescribed to you requires you to avoid driving or using heavy machinery. Activity Return to your normal activities as told by your provider. Ask your provider what activities are safe for you. Do not lift anything that is heavier than 10 lb (4.5 kg), or the limit that you are told, until your provider says that it is safe. Ask your provider when it is safe for you to drive. Do exercises as told by your provider. General instructions Do not use any products that contain nicotine or tobacco. These products include cigarettes, chewing tobacco, and vaping devices, such as e-cigarettes. These can delay bone healing. If you need help quitting, ask your provider. Keep all follow-up visits. Your provider will monitor your healing and adjust your activities. How is this prevented? Warm up and stretch before being active. Cool down and stretch after being active. Give your body time to rest between periods of activity. Be safe and responsible while being active. This will help you to avoid falls. Maintain physical fitness, including strength and flexibility. Contact a health care provider if: You have pain that gets worse or does not get better with medicine. Your symptoms get worse or do not go away after 6 months of treatment. This information is not intended to replace advice given to you by your health care provider. Make sure you discuss any questions you have with your health care provider. Document Revised: 01/15/2022 Document Reviewed: 01/15/2022 [employer] Patient Education  2024 [employer].
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
## 2022-12-22 NOTE — Progress Notes (Signed)
Safety precautions to be maintained throughout the outpatient stay will include: orient to surroundings, keep bed in low position, maintain call bell within reach at all times, provide assistance with transfer out of bed and ambulation.  

## 2022-12-29 ENCOUNTER — Other Ambulatory Visit: Payer: Commercial Managed Care - HMO

## 2022-12-31 ENCOUNTER — Encounter: Payer: Self-pay | Admitting: Family

## 2023-01-19 ENCOUNTER — Ambulatory Visit
Admission: RE | Admit: 2023-01-19 | Discharge: 2023-01-19 | Disposition: A | Discharge status: Home / Self Care | Payer: Medicaid Other | Source: Ambulatory Visit | Attending: Student in an Organized Health Care Education/Training Program | Admitting: Student in an Organized Health Care Education/Training Program

## 2023-01-19 ENCOUNTER — Encounter: Payer: Self-pay | Admitting: Student in an Organized Health Care Education/Training Program

## 2023-01-19 ENCOUNTER — Ambulatory Visit
Payer: Medicaid Other | Attending: Student in an Organized Health Care Education/Training Program | Admitting: Student in an Organized Health Care Education/Training Program

## 2023-01-19 DIAGNOSIS — Z9889 Other specified postprocedural states: Secondary | ICD-10-CM | POA: Insufficient documentation

## 2023-01-19 DIAGNOSIS — M25512 Pain in left shoulder: Secondary | ICD-10-CM | POA: Insufficient documentation

## 2023-01-19 DIAGNOSIS — M12812 Other specific arthropathies, not elsewhere classified, left shoulder: Secondary | ICD-10-CM | POA: Diagnosis present

## 2023-01-19 DIAGNOSIS — M75102 Unspecified rotator cuff tear or rupture of left shoulder, not specified as traumatic: Secondary | ICD-10-CM | POA: Insufficient documentation

## 2023-01-19 DIAGNOSIS — G8929 Other chronic pain: Secondary | ICD-10-CM | POA: Insufficient documentation

## 2023-01-19 MED ORDER — DEXAMETHASONE SODIUM PHOSPHATE 10 MG/ML IJ SOLN
10.0000 mg | Freq: Once | INTRAMUSCULAR | Status: AC
  Administered 2023-01-19: 10 mg
  Filled 2023-01-19: qty 1

## 2023-01-19 MED ORDER — LIDOCAINE HCL 2 % IJ SOLN
20.0000 mL | Freq: Once | INTRAMUSCULAR | Status: AC
  Administered 2023-01-19: 100 mg
  Filled 2023-01-19: qty 40

## 2023-01-19 MED ORDER — IOHEXOL 180 MG/ML  SOLN
10.0000 mL | Freq: Once | INTRAMUSCULAR | Status: AC
  Administered 2023-01-19: 10 mL via INTRA_ARTICULAR
  Filled 2023-01-19: qty 20

## 2023-01-19 MED ORDER — DIAZEPAM 5 MG PO TABS
5.0000 mg | ORAL_TABLET | ORAL | Status: AC
  Administered 2023-01-19: 5 mg via ORAL

## 2023-01-19 MED ORDER — DIAZEPAM 5 MG PO TABS
ORAL_TABLET | ORAL | Status: AC
  Filled 2023-01-19: qty 1

## 2023-01-19 MED ORDER — ROPIVACAINE HCL 2 MG/ML IJ SOLN
4.0000 mL | Freq: Once | INTRAMUSCULAR | Status: AC
  Administered 2023-01-19: 4 mL via INTRA_ARTICULAR
  Filled 2023-01-19: qty 20

## 2023-01-19 NOTE — Progress Notes (Signed)
Safety precautions to be maintained throughout the outpatient stay will include: orient to surroundings, keep bed in low position, maintain call bell within reach at all times, provide assistance with transfer out of bed and ambulation.  

## 2023-01-19 NOTE — Patient Instructions (Signed)

## 2023-01-19 NOTE — Progress Notes (Signed)
PROVIDER NOTE: Interpretation of information contained herein should be left to medically-trained personnel. Specific patient instructions are provided elsewhere under "Patient Instructions" section of medical record. This document was created in part using STT-dictation technology, any transcriptional errors that may result from this process are unintentional.  Patient: Melissa Garrison Type: Established DOB: [birth year] MRN: [id number] PCP: Allegra Grana, FNP  Service: Procedure DOS: 01/19/2023 Setting: Ambulatory Location: Ambulatory outpatient facility Delivery: Face-to-face Provider: Edward Jolly, MD Specialty: Interventional Pain Management Specialty designation: 09 Location: Outpatient facility Ref. Prov.: Edward Jolly, MD       Interventional Therapy   Procedure: Suprascapular nerve block (SSNB) #1  Laterality:  Left  Level: Superior to scapular spine, lateral to supraspinatus fossa (Suprascapular notch).  Imaging: Fluoroscopic guidance         Anesthesia: Local anesthesia (1-2% Lidocaine) Anxiolysis: PO Valium 5 mg DOS: 01/19/2023  Performed by: Edward Jolly, MD  Purpose: Diagnostic/Therapeutic Indications: Shoulder pain, severe enough to impact quality of life and/or function. 1. Left rotator cuff tear arthropathy   2. Hx of shoulder surgery   3. Chronic left shoulder pain    NAS-11 score:   Pre-procedure: 7 /10   Post-procedure: 4 /10     Target: Suprascapular nerve Location: midway between the medial border of the scapula and the acromion as it runs through the suprascapular notch. Region: Suprascapular, posterior shoulder  Approach: Percutaneous  Neuroanatomy: The suprascapular nerve is the lateral branch of the superior trunk of the brachial plexus. It receives nerve fibers that originate in the nerve roots C5 and C6 (and sometimes C4). It is a mixed nerve, meaning that it provides both sensory and motor supply for the suprascapular region. Function: The main  function of this nerve is to provide motor innervation for two muscles, the supraspinatus and infraspinatus muscles. They are part of the rotator cuff muscles. In addition, the suprascapular nerve provides a sensory supply to the joints of the scapula (glenohumeral and acromioclavicular joints). Rationale (medical necessity): procedure needed and proper for the diagnosis and/or treatment of the patient's medical symptoms and needs.  Position / Prep / Materials:  Position: Prone Materials:  Tray: Block Needle(s):  Type: Spinal  Gauge (G): 22  Length: 3.5 in.  Qty: 1 Prep solution: DuraPrep (Iodine Povacrylex [0.7% available iodine] and Isopropyl Alcohol, 74% w/w) Prep Area: Entire posterior shoulder area. From upper spine to shoulder proper (upper arm), and from lateral neck to lower tip of shoulder blade.   H&P (Pre-op Assessment):  Ms. Haseley is a 62 y.o. (year old), female patient, seen today for interventional treatment. She  has a past surgical history that includes Laparoscopic hysterectomy; Cholecystectomy (2011); Humerus surgery (Left); Hydradenitis excision (Right, 03/20/2017); Breast biopsy (Left, 12/05/2015); Colonoscopy (2017); Shoulder arthroscopy with subacromial decompression and open rotator cuff repair, open bicep tendon repair (Left, 01/04/2021); Capsular release (Left, 07/02/2021); Lysis of adhesion (Left, 07/02/2021); Closed manipulation shoulder with steroid injection (Left, 07/02/2021); Shoulder arthroscopy (Left, 07/02/2021); Colonoscopy with propofol (N/A, 05/20/2022); and Closed manipulation shoulder with steroid injection (Left, 08/11/2022). Ms. Homsey has a current medication list which includes the following prescription(s): amlodipine, cyclobenzaprine, onetouch verio, lisinopril, metformin, onetouch delica lancets 33g, rosuvastatin, tramadol, and acetaminophen. Her primarily concern today is the Shoulder Pain (left)  Initial Vital Signs:  Pulse/HCG Rate: 91ECG Heart  Rate: 96 Temp: 97.8 F (36.6 C) Resp: 20 BP: 138/77 SpO2: 100 %  BMI: Estimated body mass index is 25.75 kg/m as calculated from the following:   Height as of this encounter: 5'  4" (1.626 m).   Weight as of this encounter: 150 lb (68 kg).  Risk Assessment: Allergies: Reviewed. She has No Known Allergies.  Allergy Precautions: None required Coagulopathies: Reviewed. None identified.  Blood-thinner therapy: None at this time Active Infection(s): Reviewed. None identified. Ms. Steinberger is afebrile  Site Confirmation: Ms. Walby was asked to confirm the procedure and laterality before marking the site Procedure checklist: Completed Consent: Before the procedure and under the influence of no sedative(s), amnesic(s), or anxiolytics, the patient was informed of the treatment options, risks and possible complications. To fulfill our ethical and legal obligations, as recommended by the American Medical Association's Code of Ethics, I have informed the patient of my clinical impression; the nature and purpose of the treatment or procedure; the risks, benefits, and possible complications of the intervention; the alternatives, including doing nothing; the risk(s) and benefit(s) of the alternative treatment(s) or procedure(s); and the risk(s) and benefit(s) of doing nothing. The patient was provided information about the general risks and possible complications associated with the procedure. These may include, but are not limited to: failure to achieve desired goals, infection, bleeding, organ or nerve damage, allergic reactions, paralysis, and death. In addition, the patient was informed of those risks and complications associated to the procedure, such as failure to decrease pain; infection; bleeding; organ or nerve damage with subsequent damage to sensory, motor, and/or autonomic systems, resulting in permanent pain, numbness, and/or weakness of one or several areas of the body; allergic reactions;  (i.e.: anaphylactic reaction); and/or death. Furthermore, the patient was informed of those risks and complications associated with the medications. These include, but are not limited to: allergic reactions (i.e.: anaphylactic or anaphylactoid reaction(s)); adrenal axis suppression; blood sugar elevation that in diabetics may result in ketoacidosis or comma; water retention that in patients with history of congestive heart failure may result in shortness of breath, pulmonary edema, and decompensation with resultant heart failure; weight gain; swelling or edema; medication-induced neural toxicity; particulate matter embolism and blood vessel occlusion with resultant organ, and/or nervous system infarction; and/or aseptic necrosis of one or more joints. Finally, the patient was informed that Medicine is not an exact science; therefore, there is also the possibility of unforeseen or unpredictable risks and/or possible complications that may result in a catastrophic outcome. The patient indicated having understood very clearly. We have given the patient no guarantees and we have made no promises. Enough time was given to the patient to ask questions, all of which were answered to the patient's satisfaction. Ms. Monteon has indicated that she wanted to continue with the procedure. Attestation: I, the ordering provider, attest that I have discussed with the patient the benefits, risks, side-effects, alternatives, likelihood of achieving goals, and potential problems during recovery for the procedure that I have provided informed consent. Date  Time: 01/19/2023  8:36 AM  Pre-Procedure Preparation:  Monitoring: As per clinic protocol. Respiration, ETCO2, SpO2, BP, heart rate and rhythm monitor placed and checked for adequate function Safety Precautions: Patient was assessed for positional comfort and pressure points before starting the procedure. Time-out: I initiated and conducted the "Time-out" before starting  the procedure, as per protocol. The patient was asked to participate by confirming the accuracy of the "Time Out" information. Verification of the correct person, site, and procedure were performed and confirmed by me, the nursing staff, and the patient. "Time-out" conducted as per Joint Commission's Universal Protocol (UP.01.01.01). Time: 0906 Start Time: 0906 hrs.  Description of Procedure:  Procedural Technique Safety Precautions: Aspiration looking for blood return was conducted prior to all injections. At no point did we inject any substances, as a needle was being advanced. No attempts were made at seeking any paresthesias. Safe injection practices and needle disposal techniques used. Medications properly checked for expiration dates. SDV (single dose vial) medications used. Description of the Procedure: Protocol guidelines were followed. The patient was placed in position over the procedure table. The target area was identified and the area prepped in the usual manner. Skin & deeper tissues infiltrated with local anesthetic. Appropriate amount of time allowed to pass for local anesthetics to take effect. The procedure needles were then advanced to the target area. Proper needle placement secured. Negative aspiration confirmed. Solution injected in intermittent fashion, asking for systemic symptoms every 0.5cc of injectate. The needles were then removed and the area cleansed, making sure to leave some of the prepping solution back to take advantage of its long term bactericidal properties.  Vitals:   01/19/23 0839 01/19/23 0905 01/19/23 0910  BP: 138/77 (!) 120/95 133/72  Pulse: 91    Resp:  20 18  Temp: 97.8 F (36.6 C)    SpO2: 100% 100% 100%  Weight: 150 lb (68 kg)    Height: 5\' 4"  (1.626 m)       Start Time: 0906 hrs. End Time: 0909 hrs.  Imaging Guidance (Spinal):          Type of Imaging Technique: Fluoroscopy Guidance (Spinal) Indication(s): Assistance in needle guidance  and placement for procedures requiring needle placement in or near specific anatomical locations not easily accessible without such assistance. Exposure Time: Please see nurses notes. Contrast: Before injecting any contrast, we confirmed that the patient did not have an allergy to iodine, shellfish, or radiological contrast. Once satisfactory needle placement was completed at the desired level, radiological contrast was injected. Contrast injected under live fluoroscopy. No contrast complications. See chart for type and volume of contrast used. Fluoroscopic Guidance: I was personally present during the use of fluoroscopy. "Tunnel Vision Technique" used to obtain the best possible view of the target area. Parallax error corrected before commencing the procedure. "Direction-depth-direction" technique used to introduce the needle under continuous pulsed fluoroscopy. Once target was reached, antero-posterior, oblique, and lateral fluoroscopic projection used confirm needle placement in all planes. Images permanently stored in EMR. Interpretation: I personally interpreted the imaging intraoperatively. Adequate needle placement confirmed in multiple planes. Appropriate spread of contrast into desired area was observed. No evidence of afferent or efferent intravascular uptake. No intrathecal or subarachnoid spread observed. Permanent images saved into the patient's record.  Antibiotic Prophylaxis:   Anti-infectives (From admission, onward)    None      Indication(s): None identified  Post-operative Assessment:  Post-procedure Vital Signs:  Pulse/HCG Rate: 9192 Temp: 97.8 F (36.6 C) Resp: 18 BP: 133/72 SpO2: 100 %  EBL: None  Complications: No immediate post-treatment complications observed by team, or reported by patient.  Note: The patient tolerated the entire procedure well. A repeat set of vitals were taken after the procedure and the patient was kept under observation following  institutional policy, for this type of procedure. Post-procedural neurological assessment was performed, showing return to baseline, prior to discharge. The patient was provided with post-procedure discharge instructions, including a section on how to identify potential problems. Should any problems arise concerning this procedure, the patient was given instructions to immediately contact us, at any time, without hesitation. In any case, we plan to contact the patient  by telephone for a follow-up status report regarding this interventional procedure.  Comments:  No additional relevant information.  Plan of Care (POC)  Orders:  Orders Placed This Encounter  Procedures   DG PAIN CLINIC C-ARM 1-60 MIN NO REPORT    Intraoperative interpretation by procedural physician at [facility].    Standing Status:   Standing    Number of Occurrences:   1    Order Specific Question:   Reason for exam:    Answer:   Assistance in needle guidance and placement for procedures requiring needle placement in or near specific anatomical locations not easily accessible without such assistance.    Medications ordered for procedure: Meds ordered this encounter  Medications   iohexol (OMNIPAQUE) 180 MG/ML injection 10 mL    Must be Myelogram-compatible. If not available, you may substitute with a water-soluble, non-ionic, hypoallergenic, myelogram-compatible radiological contrast medium.   lidocaine (XYLOCAINE) 2 % (with pres) injection 400 mg   diazepam (VALIUM) tablet 5 mg    Make sure Flumazenil is available in the pyxis when using this medication. If oversedation occurs, administer 0.2 mg IV over 15 sec. If after 45 sec no response, administer 0.2 mg again over 1 min; may repeat at 1 min intervals; not to exceed 4 doses (1 mg)   dexamethasone (DECADRON) injection 10 mg   ropivacaine (PF) 2 mg/mL (0.2%) (NAROPIN) injection 4 mL   Medications administered: We administered iohexol, lidocaine, diazepam,  dexamethasone, and ropivacaine (PF) 2 mg/mL (0.2%).  See the medical record for exact dosing, route, and time of administration.  Follow-up plan:   Return in about 3 weeks (around 02/09/2023) for Post Procedure Evaluation, in person.       Left SSNB 01/19/23    Recent Visits Date Type Provider Dept  12/22/22 Office Visit Edward Jolly, MD [facility]  Showing recent visits within past 90 days and meeting all other requirements Today's Visits Date Type Provider Dept  01/19/23 Procedure visit Edward Jolly, MD [facility]  Showing today's visits and meeting all other requirements Future Appointments Date Type Provider Dept  02/09/23 Appointment Edward Jolly, MD [facility]  Showing future appointments within next 90 days and meeting all other requirements  Disposition: Discharge home  Discharge (Date  Time): 01/19/2023; 0920 hrs.   Primary Care Physician: Allegra Grana, FNP Location: [facility] Note by: Edward Jolly, MD (TTS technology used. I apologize for any typographical errors that were not detected and corrected.) Date: 01/19/2023; Time: 9:23 AM  Disclaimer:  Medicine is not an Visual merchandiser. The only guarantee in medicine is that nothing is guaranteed. It is important to note that the decision to proceed with this intervention was based on the information collected from the patient. The Data and conclusions were drawn from the patient's questionnaire, the interview, and the physical examination. Because the information was provided in large part by the patient, it cannot be guaranteed that it has not been purposely or unconsciously manipulated. Every effort has been made to obtain as much relevant data as possible for this evaluation. It is important to note that the conclusions that lead to this procedure are derived in large part from the available data. Always take into account that the treatment will also be  dependent on availability of resources and existing treatment guidelines, considered by other Pain Management Practitioners as being common knowledge and practice, at the time of the intervention. For Medico-Legal purposes, it is also important to  point out that variation in procedural techniques and pharmacological choices are the acceptable norm. The indications, contraindications, technique, and results of the above procedure should only be interpreted and judged by a Board-Certified Interventional Pain Specialist with extensive familiarity and expertise in the same exact procedure and technique.

## 2023-01-20 ENCOUNTER — Telehealth: Payer: Self-pay | Admitting: *Deleted

## 2023-01-20 NOTE — Telephone Encounter (Signed)
No problems post procedure. 

## 2023-01-21 ENCOUNTER — Encounter (INDEPENDENT_AMBULATORY_CARE_PROVIDER_SITE_OTHER): Payer: Self-pay

## 2023-01-23 ENCOUNTER — Ambulatory Visit
Admission: RE | Admit: 2023-01-23 | Discharge: 2023-01-23 | Disposition: A | Discharge status: Home / Self Care | Payer: Medicaid Other | Source: Ambulatory Visit | Attending: Family | Admitting: Family

## 2023-01-23 DIAGNOSIS — Z1231 Encounter for screening mammogram for malignant neoplasm of breast: Secondary | ICD-10-CM

## 2023-01-27 ENCOUNTER — Telehealth: Payer: Self-pay | Admitting: Family

## 2023-01-27 ENCOUNTER — Other Ambulatory Visit: Payer: Self-pay | Admitting: Family

## 2023-01-27 MED ORDER — FREESTYLE LANCETS MISC: 12 refills | Status: AC

## 2023-01-27 NOTE — Telephone Encounter (Signed)
[facility] called about OneTouch Delica Lancets 33G MISC stating that Melissa Garrison no longer covers for this medication. She wanted to know was there another medication similar to [employer] Lancets 33G MISC could cover through her insurance.

## 2023-01-27 NOTE — Telephone Encounter (Signed)
Sent in new rx for lancets

## 2023-01-29 ENCOUNTER — Other Ambulatory Visit: Payer: Self-pay | Admitting: Family

## 2023-01-30 ENCOUNTER — Other Ambulatory Visit: Payer: Self-pay

## 2023-01-30 MED ORDER — BLOOD GLUCOSE MONITORING SUPPL DEVI: 1.0000 | Freq: Three times a day (TID) | 0 refills | Status: AC

## 2023-02-09 ENCOUNTER — Encounter: Payer: Self-pay | Admitting: Student in an Organized Health Care Education/Training Program

## 2023-02-09 ENCOUNTER — Ambulatory Visit
Payer: Medicaid Other | Attending: Student in an Organized Health Care Education/Training Program | Admitting: Student in an Organized Health Care Education/Training Program

## 2023-02-09 VITALS — BP 141/80 | HR 90 | Temp 97.3°F | Resp 16 | Ht 64.0 in | Wt 150.0 lb

## 2023-02-09 DIAGNOSIS — Z9889 Other specified postprocedural states: Secondary | ICD-10-CM | POA: Insufficient documentation

## 2023-02-09 DIAGNOSIS — M542 Cervicalgia: Secondary | ICD-10-CM | POA: Diagnosis present

## 2023-02-09 DIAGNOSIS — G8929 Other chronic pain: Secondary | ICD-10-CM | POA: Insufficient documentation

## 2023-02-09 DIAGNOSIS — M25512 Pain in left shoulder: Secondary | ICD-10-CM | POA: Insufficient documentation

## 2023-02-09 DIAGNOSIS — M5412 Radiculopathy, cervical region: Secondary | ICD-10-CM | POA: Diagnosis not present

## 2023-02-09 NOTE — Patient Instructions (Signed)

## 2023-02-09 NOTE — Progress Notes (Signed)
PROVIDER NOTE: Information contained herein reflects review and annotations entered in association with encounter. Interpretation of such information and data should be left to medically-trained personnel. Information provided to patient can be located elsewhere in the medical record under "Patient Instructions". Document created using STT-dictation technology, any transcriptional errors that may result from process are unintentional.    Patient: Melissa Garrison  Service Category: E/M  Provider: Edward Jolly, MD  DOB: 17-Apr-1961  DOS: 02/09/2023  Referring Provider: Allegra Grana, FNP  MRN: [id number]  Specialty: Interventional Pain Management  PCP: Allegra Grana, FNP  Type: Established Patient  Setting: Ambulatory outpatient    Location: Office  Delivery: Face-to-face     HPI  Melissa Garrison, a 62 y.o. year old female, is here today because of her Cervical radicular pain [M54.12]. Melissa Garrison's primary complain today is Neck Pain (left)  Pertinent problems: Melissa Garrison does not have any pertinent problems on file. Pain Assessment: Severity of Chronic pain is reported as a 10-Worst pain ever/10. Location: Neck Left/left shoulder and left upper arm. Onset: More than a month ago. Quality: Aching, Dull, Sharp. Timing: Constant. Modifying factor(s): supporting arm on a pillow. Vitals:  height is 5\' 4"  (1.626 m) and weight is 150 lb (68 kg). Her temporal temperature is 97.3 F (36.3 C) (abnormal). Her blood pressure is 141/80 (abnormal) and her pulse is 90. Her respiration is 16 and oxygen saturation is 100%.  BMI: Estimated body mass index is 25.75 kg/m as calculated from the following:   Height as of this encounter: 5\' 4"  (1.626 m).   Weight as of this encounter: 150 lb (68 kg). Last encounter: 12/22/2022. Last procedure: 01/19/2023.  Reason for encounter: post-procedure evaluation and assessment.   Post-procedure evaluation   Procedure: Suprascapular nerve block (SSNB) #1   Laterality:  Left  Level: Superior to scapular spine, lateral to supraspinatus fossa (Suprascapular notch).  Imaging: Fluoroscopic guidance         Anesthesia: Local anesthesia (1-2% Lidocaine) Anxiolysis: PO Valium 5 mg DOS: 01/19/2023  Performed by: Edward Jolly, MD  Purpose: Diagnostic/Therapeutic Indications: Shoulder pain, severe enough to impact quality of life and/or function. 1. Left rotator cuff tear arthropathy   2. Hx of shoulder surgery   3. Chronic left shoulder pain    NAS-11 score:   Pre-procedure: 7 /10   Post-procedure: 4 /10      Effectiveness:  Initial hour after procedure: 100 %  Subsequent 4-6 hours post-procedure: 100 %  Analgesia past initial 6 hours: 10 %  Ongoing improvement:  Analgesic:  0%      ROS  Constitutional: Denies any fever or chills Gastrointestinal: No reported hemesis, hematochezia, vomiting, or acute GI distress Musculoskeletal:  Left cervical spine and left shoulder pain Neurological: No reported episodes of acute onset apraxia, aphasia, dysarthria, agnosia, amnesia, paralysis, loss of coordination, or loss of consciousness  Medication Review  Blood Glucose Monitoring Suppl, OneTouch Delica Lancets 33G, acetaminophen, amLODipine, cyclobenzaprine, freestyle, lisinopril, metFORMIN, rosuvastatin, and traMADol  History Review  Allergy: Melissa Garrison has No Known Allergies. Drug: Melissa Garrison  reports that she does not currently use drugs. Alcohol:  reports current alcohol use of about 3.0 standard drinks of alcohol per week. Tobacco:  reports that she has been smoking cigarettes. She has a 5 pack-year smoking history. She has never used smokeless tobacco. Social: Melissa Garrison  reports that she has been smoking cigarettes. She has a 5 pack-year smoking history. She has never used smokeless tobacco. She reports current alcohol  use of about 3.0 standard drinks of alcohol per week. She reports that she does not currently use drugs. Medical:   has a past medical history of Allergic rhinitis (07/11/2011), Diabetes mellitus without complication (HCC), Hepatitis C, Hepatitis C, chronic (HCC) (07/11/2011), Hyperlipidemia, Hypertension (07/11/2011), IV drug abuse (HCC), Vitamin D deficiency, and Wears dentures. Surgical: Melissa Garrison  has a past surgical history that includes Laparoscopic hysterectomy; Cholecystectomy (2011); Humerus surgery (Left); Hydradenitis excision (Right, 03/20/2017); Breast biopsy (Left, 12/05/2015); Colonoscopy (2017); Shoulder arthroscopy with subacromial decompression and open rotator cuff repair, open bicep tendon repair (Left, 01/04/2021); Capsular release (Left, 07/02/2021); Lysis of adhesion (Left, 07/02/2021); Closed manipulation shoulder with steroid injection (Left, 07/02/2021); Shoulder arthroscopy (Left, 07/02/2021); Colonoscopy with propofol (N/A, 05/20/2022); and Closed manipulation shoulder with steroid injection (Left, 08/11/2022). Family: family history includes Diabetes in her father; Hypertension in her brother and mother.  Laboratory Chemistry Profile   Renal Lab Results  Component Value Date   BUN 12 07/14/2022   CREATININE 0.57 07/14/2022   GFR 99.59 02/10/2022   GFRAA >60 03/16/2017   GFRNONAA >60 07/14/2022    Hepatic Lab Results  Component Value Date   AST 25 10/18/2021   ALT 14 10/18/2021   ALBUMIN 4.4 10/18/2021   ALKPHOS 55 10/18/2021    Electrolytes Lab Results  Component Value Date   NA 136 07/14/2022   K 3.9 07/14/2022   CL 100 07/14/2022   CALCIUM 9.9 07/14/2022    Bone Lab Results  Component Value Date   VD25OH 13.67 (L) 10/18/2021    Inflammation (CRP: Acute Phase) (ESR: Chronic Phase) No results found for: "CRP", "ESRSEDRATE", "LATICACIDVEN"       Note: Above Lab results reviewed.  Recent Imaging Review  MM 3D SCREENING MAMMOGRAM BILATERAL BREAST CLINICAL DATA:  Screening.  EXAM: DIGITAL SCREENING BILATERAL MAMMOGRAM WITH TOMOSYNTHESIS AND  CAD  TECHNIQUE: Bilateral screening digital craniocaudal and mediolateral oblique mammograms were obtained. Bilateral screening digital breast tomosynthesis was performed. The images were evaluated with computer-aided detection.  COMPARISON:  Previous exam(s).  ACR Breast Density Category c: The breasts are heterogeneously dense, which may obscure small masses.  FINDINGS: There are no findings suspicious for malignancy.  IMPRESSION: No mammographic evidence of malignancy. A result letter of this screening mammogram will be mailed directly to the patient.  RECOMMENDATION: Screening mammogram in one year. (Code:SM-B-01Y)  BI-RADS CATEGORY  1: Negative.  Electronically Signed   By: Hulan Saas M.D.   On: 01/26/2023 11:35 Note: Reviewed        Physical Exam  General appearance: Well nourished, well developed, and well hydrated. In no apparent acute distress Mental status: Alert, oriented x 3 (person, place, & time)       Respiratory: No evidence of acute respiratory distress Eyes: PERLA Vitals: BP (!) 141/80   Pulse 90   Temp (!) 97.3 F (36.3 C) (Temporal)   Resp 16   Ht 5\' 4"  (1.626 m)   Wt 150 lb (68 kg)   SpO2 100%   BMI 25.75 kg/m  BMI: Estimated body mass index is 25.75 kg/m as calculated from the following:   Height as of this encounter: 5\' 4"  (1.626 m).   Weight as of this encounter: 150 lb (68 kg). Ideal: Ideal body weight: 54.7 kg (120 lb 9.5 oz) Adjusted ideal body weight: 60 kg (132 lb 5.7 oz)  Cervical Spine Area Exam  Skin & Axial Inspection: No masses, redness, edema, swelling, or associated skin lesions Alignment: Symmetrical Functional ROM: Pain restricted ROM,  to the left Stability: No instability detected Muscle Tone/Strength: Functionally intact. No obvious neuro-muscular anomalies detected. Sensory (Neurological): Dermatomal pain pattern Palpation: No palpable anomalies             Upper Extremity (UE) Exam    Side: Right upper  extremity  Side: Left upper extremity  Skin & Extremity Inspection: Skin color, temperature, and hair growth are WNL. No peripheral edema or cyanosis. No masses, redness, swelling, asymmetry, or associated skin lesions. No contractures.  Skin & Extremity Inspection: Skin color, temperature, and hair growth are WNL. No peripheral edema or cyanosis. No masses, redness, swelling, asymmetry, or associated skin lesions. No contractures.  Functional ROM: Unrestricted ROM          Functional ROM: Unrestricted ROM          Muscle Tone/Strength: Functionally intact. No obvious neuro-muscular anomalies detected.  Muscle Tone/Strength: Functionally intact. No obvious neuro-muscular anomalies detected.  Sensory (Neurological): Unimpaired          Sensory (Neurological): Dermatomal pain pattern          Palpation: No palpable anomalies              Palpation: No palpable anomalies              Provocative Test(s):  Phalen's test: deferred Tinel's test: deferred Apley's scratch test (touch opposite shoulder):  Action 1 (Across chest): deferred Action 2 (Overhead): deferred Action 3 (LB reach): deferred   Provocative Test(s):  Phalen's test: deferred Tinel's test: deferred Apley's scratch test (touch opposite shoulder):  Action 1 (Across chest): deferred Action 2 (Overhead): Decreased ROM Action 3 (LB reach): Decreased ROM     Assessment   Diagnosis Status  1. Cervical radicular pain   2. Chronic left shoulder pain   3. Hx of shoulder surgery   4. Cervicalgia    Worsening Worsening Controlled   Updated Problems: Problem  Cervical Radicular Pain  Chronic Left Shoulder Pain  Hx of Shoulder Surgery    Plan of Care  Unfortunately, no significant benefit after her left suprascapular nerve block.  Do not recommend radiofrequency ablation or peripheral nerve stimulation at this point.  She states that the pain radiates from her neck region to her posterior lateral shoulder overlying her deltoid.   She has occasional numbness and tingling in this area as well.  Recommend follow-up with MRI of cervical spine as below to evaluate for any foraminal stenosis or cervical degenerative disc disease/facet changes that could be contributing to her symptoms.  Orders:  Orders Placed This Encounter  Procedures   MR CERVICAL SPINE WO CONTRAST    Patient presents with axial pain with possible radicular component. Please assist [place] in identifying specific level(s) and laterality of any additional findings such as: 1. Facet (Zygapophyseal) joint DJD (Hypertrophy, space narrowing, subchondral sclerosis, and/or osteophyte formation) 2. DDD and/or IVDD (Loss of disc height, desiccation, gas patterns, osteophytes, endplate sclerosis, or "Black disc disease") 3. Pars defects 4. Spondylolisthesis, spondylosis, and/or spondyloarthropathies (include Degree/Grade of displacement in mm) (stability) 5. Vertebral body Fractures (acute/chronic) (state percentage of collapse) 6. Demineralization (osteopenia/osteoporotic) 7. Bone pathology 8. Foraminal narrowing  9. Surgical changes 10. Central, Lateral Recess, and/or Foraminal Stenosis (include AP diameter of stenosis in mm) 11. Surgical changes (hardware type, status, and presence of fibrosis) 12. Modic Type Changes (MRI only) 13. IVDD (Disc bulge, protrusion, herniation, extrusion) (Level, laterality, extent)    Standing Status:   Future    Standing Expiration Date:   03/12/2023  Scheduling Instructions:     Please make sure that the patient understands that this needs to be done as soon as possible. Never have the patient do the imaging "just before the next appointment". Inform patient that having the imaging done within the [facility] will expedite the availability of the results and will provide      imaging availability to the requesting physician. In addition inform the patient that the imaging order has an expiration date and will not be renewed if not  done within the active period.    Order Specific Question:   What is the patient's sedation requirement?    Answer:   No Sedation    Order Specific Question:   Does the patient have a pacemaker or implanted devices?    Answer:   No    Order Specific Question:   Preferred imaging location?    Answer:   [facility] (table limit-350lbs)    Order Specific Question:   Call Results- Best Contact Number?    Answer:   [phone] [facility])    Order Specific Question:   Radiology Contrast Protocol - do NOT remove file path    Answer:   [email or web address]   Follow-up plan:   Return for patient will call to schedule F2F appt prn.      Left SSNB 01/19/23     Recent Visits Date Type Provider Dept  01/19/23 Procedure visit Edward Jolly, MD [facility]  12/22/22 Office Visit Edward Jolly, MD [facility]  Showing recent visits within past 90 days and meeting all other requirements Today's Visits Date Type Provider Dept  02/09/23 Office Visit Edward Jolly, MD [facility]  Showing today's visits and meeting all other requirements Future Appointments No visits were found meeting these conditions. Showing future appointments within next 90 days and meeting all other requirements  I discussed the assessment and treatment plan with the patient. The patient was provided an opportunity to ask questions and all were answered. The patient agreed with the plan and demonstrated an understanding of the instructions.  Patient advised to call back or seek an in-person evaluation if the symptoms or condition worsens.  Duration of encounter: .  Total time on encounter, as per AMA guidelines included both the face-to-face and non-face-to-face time personally spent by the physician and/or other qualified health care professional(s) on the day of the encounter (includes time in activities that require the physician or other  qualified health care professional and does not include time in activities normally performed by clinical staff). Physician's time may include the following activities when performed: Preparing to see the patient (e.g., pre-charting review of records, searching for previously ordered imaging, lab work, and nerve conduction tests) Review of prior analgesic pharmacotherapies. Reviewing PMP Interpreting ordered tests (e.g., lab work, imaging, nerve conduction tests) Performing post-procedure evaluations, including interpretation of diagnostic procedures Obtaining and/or reviewing separately obtained history Performing a medically appropriate examination and/or evaluation Counseling and educating the patient/family/caregiver Ordering medications, tests, or procedures Referring and communicating with other health care professionals (when not separately reported) Documenting clinical information in the electronic or other health record Independently interpreting results (not separately reported) and communicating results to the patient/ family/caregiver Care coordination (not separately reported)  Note by: Edward Jolly, MD Date: 02/09/2023; Time: 2:21 PM

## 2023-02-16 ENCOUNTER — Ambulatory Visit: Payer: Commercial Managed Care - HMO | Admitting: Family

## 2023-02-16 ENCOUNTER — Ambulatory Visit
Admission: RE | Admit: 2023-02-16 | Discharge: 2023-02-16 | Disposition: A | Discharge status: Home / Self Care | Payer: Medicaid Other | Source: Ambulatory Visit | Attending: Student in an Organized Health Care Education/Training Program | Admitting: Student in an Organized Health Care Education/Training Program

## 2023-02-16 DIAGNOSIS — M25512 Pain in left shoulder: Secondary | ICD-10-CM | POA: Diagnosis present

## 2023-02-16 DIAGNOSIS — M542 Cervicalgia: Secondary | ICD-10-CM | POA: Insufficient documentation

## 2023-02-16 DIAGNOSIS — G8929 Other chronic pain: Secondary | ICD-10-CM | POA: Insufficient documentation

## 2023-02-16 DIAGNOSIS — M5412 Radiculopathy, cervical region: Secondary | ICD-10-CM | POA: Insufficient documentation

## 2023-02-18 ENCOUNTER — Encounter: Payer: Self-pay | Admitting: Family

## 2023-02-18 ENCOUNTER — Ambulatory Visit (INDEPENDENT_AMBULATORY_CARE_PROVIDER_SITE_OTHER): Payer: Medicaid Other | Admitting: Family

## 2023-02-18 VITALS — BP 136/76 | HR 85 | Temp 98.5°F | Ht 64.0 in | Wt 150.4 lb

## 2023-02-18 DIAGNOSIS — I10 Essential (primary) hypertension: Secondary | ICD-10-CM | POA: Diagnosis not present

## 2023-02-18 DIAGNOSIS — Z23 Encounter for immunization: Secondary | ICD-10-CM | POA: Diagnosis not present

## 2023-02-18 DIAGNOSIS — R899 Unspecified abnormal finding in specimens from other organs, systems and tissues: Secondary | ICD-10-CM

## 2023-02-18 DIAGNOSIS — R7309 Other abnormal glucose: Secondary | ICD-10-CM

## 2023-02-18 DIAGNOSIS — Z7985 Long-term (current) use of injectable non-insulin antidiabetic drugs: Secondary | ICD-10-CM

## 2023-02-18 DIAGNOSIS — M542 Cervicalgia: Secondary | ICD-10-CM | POA: Diagnosis not present

## 2023-02-18 DIAGNOSIS — M25512 Pain in left shoulder: Secondary | ICD-10-CM

## 2023-02-18 DIAGNOSIS — G8929 Other chronic pain: Secondary | ICD-10-CM

## 2023-02-18 DIAGNOSIS — E119 Type 2 diabetes mellitus without complications: Secondary | ICD-10-CM

## 2023-02-18 DIAGNOSIS — Z7984 Long term (current) use of oral hypoglycemic drugs: Secondary | ICD-10-CM

## 2023-02-18 LAB — COMPREHENSIVE METABOLIC PANEL
ALT: 13 U/L (ref 0–35)
AST: 15 U/L (ref 0–37)
Albumin: 4 g/dL (ref 3.5–5.2)
Alkaline Phosphatase: 50 U/L (ref 39–117)
BUN: 12 mg/dL (ref 6–23)
CO2: 26 mEq/L (ref 19–32)
Calcium: 10.3 mg/dL (ref 8.4–10.5)
Chloride: 101 mEq/L (ref 96–112)
Creatinine, Ser: 0.65 mg/dL (ref 0.40–1.20)
GFR: 95.8 mL/min (ref >60.00)
Glucose, Bld: 227 mg/dL — ABNORMAL HIGH (ref 70–99)
Potassium: 4.2 mEq/L (ref 3.5–5.1)
Sodium: 136 mEq/L (ref 135–145)
Total Bilirubin: 0.3 mg/dL (ref 0.2–1.2)
Total Protein: 7.4 g/dL (ref 6.0–8.3)

## 2023-02-18 LAB — CBC WITH DIFFERENTIAL/PLATELET
Basophils Absolute: 0 10*3/uL (ref 0.0–0.1)
Basophils Relative: 0.3 % (ref 0.0–3.0)
Eosinophils Absolute: 0.1 10*3/uL (ref 0.0–0.7)
Eosinophils Relative: 1 % (ref 0.0–5.0)
HCT: 35.4 % — ABNORMAL LOW (ref 36.0–46.0)
Hemoglobin: 11.6 g/dL — ABNORMAL LOW (ref 12.0–15.0)
Lymphocytes Relative: 43.1 % (ref 12.0–46.0)
Lymphs Abs: 3.8 10*3/uL (ref 0.7–4.0)
MCHC: 32.7 g/dL (ref 30.0–36.0)
MCV: 85.8 fl (ref 78.0–100.0)
Monocytes Absolute: 0.5 10*3/uL (ref 0.1–1.0)
Monocytes Relative: 5.5 % (ref 3.0–12.0)
Neutro Abs: 4.4 10*3/uL (ref 1.4–7.7)
Neutrophils Relative %: 50.1 % (ref 43.0–77.0)
Platelets: 357 10*3/uL (ref 150.0–400.0)
RBC: 4.13 Mil/uL (ref 3.87–5.11)
RDW: 14 % (ref 11.5–15.5)
WBC: 8.7 10*3/uL (ref 4.0–10.5)

## 2023-02-18 LAB — POCT GLYCOSYLATED HEMOGLOBIN (HGB A1C): Hemoglobin A1C: 9.7 % — AB (ref 4.0–5.6)

## 2023-02-18 MED ORDER — SEMAGLUTIDE(0.25 OR 0.5MG/DOS) 2 MG/3ML ~~LOC~~ SOPN
0.2500 mg | PEN_INJECTOR | SUBCUTANEOUS | 2 refills | Status: DC
Start: 2023-02-18 — End: 2023-08-24

## 2023-02-18 MED ORDER — TRAMADOL HCL 50 MG PO TABS
50.0000 mg | ORAL_TABLET | Freq: Two times a day (BID) | ORAL | 2 refills | Status: DC | PRN
Start: 2023-02-18 — End: 2023-08-24

## 2023-02-18 NOTE — Patient Instructions (Addendum)
Please call [facility] and see if in network.  You likely can schedule an appointment without a referral.  You would need to request diabetic eye exam-[phone]  As discussed, increase tramadol from 50 mg once daily to 50 mg twice daily as needed.  This medication can be sedating so please ensure you do not feel drowsy or groggy on this medication particularly as you are also taking a muscle relaxant called Flexeril. Please continue to follow with pain management, Dr. Cherylann Ratel   Start ozempic 0.25mg  once per week once per week injected subcutaneously ( [place])  in stomach. Please clean with alcohol swab prior to injection and be sure to rotate site. You may schedule a nurse visit if you would like to first injection.   After 4 weeks, and if tolerated and weight loss has not reached 1-2 lbs per week, please increase to 0.5mg  once per week [place].    Please read information on medication below and remember black box warning that you may not take if you or a family member is diagnosed with thyroid cancer (medullary thyroid cancer), or multiple endocrine neoplasia ( MEN).       Semaglutide injection solution What is this medicine? SEMAGLUTIDE (Sem a GLOO tide) is used to improve blood sugar control in adults with type 2 diabetes. This medicine may be used with other diabetes medicines. This drug may also reduce the risk of heart attack or stroke if you have type 2 diabetes and risk factors for heart disease. This medicine may be used for other purposes; ask your health care provider or pharmacist if you have questions. COMMON BRAND NAME(S): OZEMPIC What should I tell my health care provider before I take this medicine? They need to know if you have any of these conditions: endocrine tumors (MEN 2) or if someone in your family had these tumors eye disease, vision problems history of pancreatitis kidney disease stomach problems thyroid cancer or if someone in your family had thyroid cancer an  unusual or allergic reaction to semaglutide, other medicines, foods, dyes, or preservatives pregnant or trying to get pregnant breast-feeding How should I use this medicine? This medicine is for injection under the skin of your upper leg (thigh), stomach area, or upper arm. It is given once every week (every 7 days). You will be taught how to prepare and give this medicine. Use exactly as directed. Take your medicine at regular intervals. Do not take it more often than directed. If you use this medicine with insulin, you should inject this medicine and the insulin separately. Do not mix them together. Do not give the injections right next to each other. Change (rotate) injection sites with each injection. It is important that you put your used needles and syringes in a special sharps container. Do not put them in a trash can. If you do not have a sharps container, call your pharmacist or healthcare provider to get one. A special MedGuide will be given to you by the pharmacist with each prescription and refill. Be sure to read this information carefully each time. This drug comes with INSTRUCTIONS FOR USE. Ask your pharmacist for directions on how to use this drug. Read the information carefully. Talk to your pharmacist or health care provider if you have questions. Talk to your pediatrician regarding the use of this medicine in children. Special care may be needed. Overdosage: If you think you have taken too much of this medicine contact a poison control center or emergency room at  once. NOTE: This medicine is only for you. Do not share this medicine with others. What if I miss a dose? If you miss a dose, take it as soon as you can within 5 days after the missed dose. Then take your next dose at your regular weekly time. If it has been longer than 5 days after the missed dose, do not take the missed dose. Take the next dose at your regular time. Do not take double or extra doses. If you have questions  about a missed dose, contact your health care provider for advice. What may interact with this medicine? other medicines for diabetes Many medications may cause changes in blood sugar, these include: alcohol containing beverages antiviral medicines for HIV or AIDS aspirin and aspirin-like drugs certain medicines for blood pressure, heart disease, irregular heart beat chromium diuretics female hormones, such as estrogens or progestins, birth control pills fenofibrate gemfibrozil isoniazid lanreotide female hormones or anabolic steroids MAOIs like Carbex, Eldepryl, Marplan, Nardil, and Parnate medicines for weight loss medicines for allergies, asthma, cold, or cough medicines for depression, anxiety, or psychotic disturbances niacin nicotine NSAIDs, medicines for pain and inflammation, like ibuprofen or naproxen octreotide pasireotide pentamidine phenytoin probenecid quinolone antibiotics such as ciprofloxacin, levofloxacin, ofloxacin some herbal dietary supplements steroid medicines such as prednisone or cortisone sulfamethoxazole; trimethoprim thyroid hormones Some medications can hide the warning symptoms of low blood sugar (hypoglycemia). You may need to monitor your blood sugar more closely if you are taking one of these medications. These include: beta-blockers, often used for high blood pressure or heart problems (examples include atenolol, metoprolol, propranolol) clonidine guanethidine reserpine This list may not describe all possible interactions. Give your health care provider a list of all the medicines, herbs, non-prescription drugs, or dietary supplements you use. Also tell them if you smoke, drink alcohol, or use illegal drugs. Some items may interact with your medicine. What should I watch for while using this medicine? Visit your doctor or health care professional for regular checks on your progress. Drink plenty of fluids while taking this medicine. Check with  your doctor or health care professional if you get an attack of severe diarrhea, nausea, and vomiting. The loss of too much body fluid can make it dangerous for you to take this medicine. A test called the HbA1C (A1C) will be monitored. This is a simple blood test. It measures your blood sugar control over the last 2 to 3 months. You will receive this test every 3 to 6 months. Learn how to check your blood sugar. Learn the symptoms of low and high blood sugar and how to manage them. Always carry a quick-source of sugar with you in case you have symptoms of low blood sugar. Examples include hard sugar candy or glucose tablets. Make sure others know that you can choke if you eat or drink when you develop serious symptoms of low blood sugar, such as seizures or unconsciousness. They must get medical help at once. Tell your doctor or health care professional if you have high blood sugar. You might need to change the dose of your medicine. If you are sick or exercising more than usual, you might need to change the dose of your medicine. Do not skip meals. Ask your doctor or health care professional if you should avoid alcohol. Many nonprescription cough and cold products contain sugar or alcohol. These can affect blood sugar. Pens should never be shared. Even if the needle is changed, sharing may result in passing of viruses  like hepatitis or HIV. Wear a medical ID bracelet or chain, and carry a card that describes your disease and details of your medicine and dosage times. Do not become pregnant while taking this medicine. Women should inform their doctor if they wish to become pregnant or think they might be pregnant. There is a potential for serious side effects to an unborn child. Talk to your health care professional or pharmacist for more information. What side effects may I notice from receiving this medicine? Side effects that you should report to your doctor or health care professional as soon as  possible: allergic reactions like skin rash, itching or hives, swelling of the face, lips, or tongue breathing problems changes in vision diarrhea that continues or is severe lump or swelling on the neck severe nausea signs and symptoms of infection like fever or chills; cough; sore throat; pain or trouble passing urine signs and symptoms of low blood sugar such as feeling anxious, confusion, dizziness, increased hunger, unusually weak or tired, sweating, shakiness, cold, irritable, headache, blurred vision, fast heartbeat, loss of consciousness signs and symptoms of kidney injury like trouble passing urine or change in the amount of urine trouble swallowing unusual stomach upset or pain vomiting Side effects that usually do not require medical attention (report to your doctor or health care professional if they continue or are bothersome): constipation diarrhea nausea pain, redness, or irritation at site where injected stomach upset This list may not describe all possible side effects. Call your doctor for medical advice about side effects. You may report side effects to FDA at 1-800-FDA-1088. Where should I keep my medicine? Keep out of the reach of children. Store unopened pens in a refrigerator between 2 and 8 degrees C (36 and 46 degrees F). Do not freeze. Protect from light and heat. After you first use the pen, it can be stored for 56 days at room temperature between 15 and 30 degrees C (59 and 86 degrees F) or in a refrigerator. Throw away your used pen after 56 days or after the expiration date, whichever comes first. Do not store your pen with the needle attached. If the needle is left on, medicine may leak from the pen. NOTE: This sheet is a summary. It may not cover all possible information. If you have questions about this medicine, talk to your doctor, pharmacist, or health care provider.  2021 Elsevier/Gold Standard (2019-02-22 09:41:51)

## 2023-02-18 NOTE — Assessment & Plan Note (Signed)
Lab Results  Component Value Date   HGBA1C 9.7 (A) 02/18/2023   Uncontrolled.  Continue metformin 1000 mg twice daily.  Start Ozempic 0.25 mg and after 4 weeks increase to 0.5 mg weekly.  Counseled on side effects, mechanism of action, blackbox warning as it relates to medullary thyroid cancer, multiple endocrine neoplasia.

## 2023-02-18 NOTE — Assessment & Plan Note (Signed)
Suboptimal control.  Following with pain management.  Increase tramadol to 50 mg twice daily as needed.  Continue Flexeril 5 mg 3 times daily as needed.  Counseled on sedation.  Patient no longer drinking alcohol.  Will follow.

## 2023-02-18 NOTE — Assessment & Plan Note (Signed)
Slightly elevated today.  Discussed DASH diet.  Consider changing lisinopril to irbesartan or telmisartan at follow-up if systolic blood pressure persistently in the 130s.  Continue amlodipine 5 mg daily, lisinopril 40 mg daily

## 2023-02-18 NOTE — Progress Notes (Signed)
Assessment & Plan:  Diabetes mellitus without complication [facility]) Assessment & Plan: Lab Results  Component Value Date   HGBA1C 9.7 (A) 02/18/2023   Uncontrolled.  Continue metformin 1000 mg twice daily.  Start Ozempic 0.25 mg and after 4 weeks increase to 0.5 mg weekly.  Counseled on side effects, mechanism of action, blackbox warning as it relates to medullary thyroid cancer, multiple endocrine neoplasia.  Orders: -     Semaglutide(0.25 or 0.5MG /DOS); Inject 0.25 mg into the skin once a week.  Dispense: 3 mL; Refill: 2 -     Comprehensive metabolic panel  Elevated glucose -     POCT glycosylated hemoglobin (Hb A1C)  Neck pain -     traMADol HCl; Take 1 tablet (50 mg total) by mouth 2 (two) times daily as needed.  Dispense: 60 tablet; Refill: 2  Primary hypertension Assessment & Plan: Slightly elevated today.  Discussed DASH diet.  Consider changing lisinopril to irbesartan or telmisartan at follow-up if systolic blood pressure persistently in the 130s.  Continue amlodipine 5 mg daily, lisinopril 40 mg daily   Abnormal laboratory test -     CBC with Differential/Platelet  Chronic left shoulder pain Assessment & Plan: Suboptimal control.  Following with pain management.  Increase tramadol to 50 mg twice daily as needed.  Continue Flexeril 5 mg 3 times daily as needed.  Counseled on sedation.  Patient no longer drinking alcohol.  Will follow.       Return precautions given.   Risks, benefits, and alternatives of the medications and treatment plan prescribed today were discussed, and patient expressed understanding.   Education regarding symptom management and diagnosis given to patient on AVS either electronically or printed.  Return in about 3 months (around 05/21/2023).  Rennie Plowman, FNP  Subjective:    Patient ID: Melissa Garrison, female    DOB: February 20, 1961, 62 y.o.   MRN: [id number]  CC: Melissa Garrison is a 62 y.o. female who presents today for follow up.    HPI: Compliant with metformin 1000 mg BID  She is interested in starting Ozempic.  No family or personal history of medullary thyroid cancer, multiple endocrine neoplasia.  No constipation  Following with Dr Cherylann Ratel for left shoulder and neck . She is taking tramadol 50mg      Allergies: Patient has no known allergies. Current Outpatient Medications on File Prior to Visit  Medication Sig Dispense Refill   amLODipine (NORVASC) 5 MG tablet Take 1 tablet (5 mg total) by mouth daily. 90 tablet 3   Blood Glucose Monitoring Suppl DEVI 1 each by Does not apply route in the morning, at noon, and at bedtime. May substitute to any manufacturer covered by patient's insurance. 1 each 0   cyclobenzaprine (FLEXERIL) 5 MG tablet Take 1-2 tablets (5-10 mg total) by mouth 3 (three) times daily as needed for muscle spasms. 90 tablet 1   Lancets (FREESTYLE) lancets Use as instructed 100 each 12   lisinopril (ZESTRIL) 40 MG tablet Take 1 tablet (40 mg total) by mouth daily. 90 tablet 3   metFORMIN (GLUCOPHAGE-XR) 500 MG 24 hr tablet TAKE 2 TABLETS BY MOUTH IN THE MORNING AND 2 AT BEDTIME 360 tablet 0   OneTouch Delica Lancets 33G MISC Used to check blood sugar once daily. 200 each 3   rosuvastatin (CRESTOR) 10 MG tablet Take 1 tablet daily three days per week 48 tablet 2   No current facility-administered medications on file prior to visit.    Review of Systems  Constitutional:  Negative for chills and fever.  Respiratory:  Negative for cough.   Cardiovascular:  Negative for chest pain and palpitations.  Gastrointestinal:  Negative for nausea and vomiting.      Objective:    BP 136/76   Pulse 85   Temp 98.5 F (36.9 C) (Oral)   Ht 5\' 4"  (1.626 m)   Wt 150 lb 6.4 oz (68.2 kg)   SpO2 99%   BMI 25.82 kg/m  BP Readings from Last 3 Encounters:  02/18/23 136/76  02/09/23 (!) 141/80  01/19/23 133/72   Wt Readings from Last 3 Encounters:  02/18/23 150 lb 6.4 oz (68.2 kg)  02/09/23 150 lb (68  kg)  01/19/23 150 lb (68 kg)    Physical Exam Vitals reviewed.  Constitutional:      Appearance: She is well-developed.  Eyes:     Conjunctiva/sclera: Conjunctivae normal.  Cardiovascular:     Rate and Rhythm: Normal rate and regular rhythm.     Pulses: Normal pulses.     Heart sounds: Normal heart sounds.  Pulmonary:     Effort: Pulmonary effort is normal.     Breath sounds: Normal breath sounds. No wheezing, rhonchi or rales.  Skin:    General: Skin is warm and dry.  Neurological:     Mental Status: She is alert.  Psychiatric:        Speech: Speech normal.        Behavior: Behavior normal.        Thought Content: Thought content normal.

## 2023-02-24 ENCOUNTER — Other Ambulatory Visit: Payer: Self-pay | Admitting: *Deleted

## 2023-02-24 DIAGNOSIS — D649 Anemia, unspecified: Secondary | ICD-10-CM

## 2023-02-27 ENCOUNTER — Other Ambulatory Visit (HOSPITAL_COMMUNITY): Payer: Self-pay

## 2023-02-27 ENCOUNTER — Telehealth: Payer: Self-pay

## 2023-02-27 NOTE — Telephone Encounter (Signed)
Pharmacy Patient Advocate Encounter   Received notification from CoverMyMeds that prior authorization for Ozempic 2mg /54ml is required/requested.   Insurance verification completed.   The patient is insured through  [employer]  .   Per test claim: PA required; PA submitted to [employer] via CoverMyMeds Key/confirmation #/EOC  [id number] Status is pending

## 2023-03-02 ENCOUNTER — Other Ambulatory Visit (HOSPITAL_COMMUNITY): Payer: Self-pay

## 2023-03-02 NOTE — Telephone Encounter (Signed)
Spoke to pt and informed her that her Ozempic has been approved and notification has been sent in to the pharmacy

## 2023-03-02 NOTE — Telephone Encounter (Signed)
Pharmacy Patient Advocate Encounter  Received notification from  [employer]  that Prior Authorization for Ozempic 2mg /7ml has been APPROVED from 02/27/23 to 02/27/24. Ran test claim, Copay is $4. This test claim was processed through [facility]- copay amounts may vary at other pharmacies due to pharmacy/plan contracts, or as the patient moves through the different stages of their insurance plan.   PA #/Case ID/Reference #: [id number]  Left a voicemail at [facility] pharmacy to notify of the approval.

## 2023-03-09 ENCOUNTER — Telehealth: Payer: Self-pay

## 2023-03-09 NOTE — Telephone Encounter (Signed)
Patient notified of MRI results per Dr Cherylann Ratel.  Appt made to discuss PNS

## 2023-03-11 ENCOUNTER — Encounter: Payer: Self-pay | Admitting: Student in an Organized Health Care Education/Training Program

## 2023-03-11 ENCOUNTER — Ambulatory Visit
Payer: Medicare Other | Attending: Student in an Organized Health Care Education/Training Program | Admitting: Student in an Organized Health Care Education/Training Program

## 2023-03-11 VITALS — BP 121/70 | HR 89 | Temp 97.5°F | Ht 64.0 in | Wt 140.0 lb

## 2023-03-11 DIAGNOSIS — M12812 Other specific arthropathies, not elsewhere classified, left shoulder: Secondary | ICD-10-CM | POA: Diagnosis present

## 2023-03-11 DIAGNOSIS — Z9889 Other specified postprocedural states: Secondary | ICD-10-CM | POA: Insufficient documentation

## 2023-03-11 DIAGNOSIS — S4432XS Injury of axillary nerve, left arm, sequela: Secondary | ICD-10-CM | POA: Diagnosis present

## 2023-03-11 DIAGNOSIS — M25512 Pain in left shoulder: Secondary | ICD-10-CM | POA: Diagnosis present

## 2023-03-11 DIAGNOSIS — M542 Cervicalgia: Secondary | ICD-10-CM | POA: Diagnosis not present

## 2023-03-11 DIAGNOSIS — M47812 Spondylosis without myelopathy or radiculopathy, cervical region: Secondary | ICD-10-CM | POA: Diagnosis present

## 2023-03-11 DIAGNOSIS — M75102 Unspecified rotator cuff tear or rupture of left shoulder, not specified as traumatic: Secondary | ICD-10-CM | POA: Insufficient documentation

## 2023-03-11 DIAGNOSIS — G8929 Other chronic pain: Secondary | ICD-10-CM | POA: Insufficient documentation

## 2023-03-11 NOTE — Progress Notes (Signed)
PROVIDER NOTE: Information contained herein reflects review and annotations entered in association with encounter. Interpretation of such information and data should be left to medically-trained personnel. Information provided to patient can be located elsewhere in the medical record under "Patient Instructions". Document created using STT-dictation technology, any transcriptional errors that may result from process are unintentional.    Patient: Melissa Garrison  Service Category: E/M  Provider: Edward Jolly, MD  DOB: Apr 03, 1961  DOS: 03/11/2023  Referring Provider: Allegra Grana, FNP  MRN: [id number]  Specialty: Interventional Pain Management  PCP: Allegra Grana, FNP  Type: Established Patient  Setting: Ambulatory outpatient    Location: Office  Delivery: Face-to-face     HPI  Melissa Garrison, a 62 y.o. year old female, is here today because of her Cervical spondylosis [M47.812]. Melissa Garrison primary complain today is Shoulder Pain (left) Pain Assessment: Severity of Chronic pain is reported as a 8 /10. Location: Shoulder Left/radiates down to left elbow. Onset: More than a month ago. Quality: Constant, Aching, Dull, Sharp, Stabbing, Shooting. Timing: Constant. Modifying factor(s): meds, prop up. Vitals:  height is 5\' 4"  (1.626 m) and weight is 140 lb (63.5 kg). Her temporal temperature is 97.5 F (36.4 C) (abnormal). Her blood pressure is 121/70 and her pulse is 89. Her oxygen saturation is 100%.  BMI: Estimated body mass index is 24.03 kg/m as calculated from the following:   Height as of this encounter: 5\' 4"  (1.626 m).   Weight as of this encounter: 140 lb (63.5 kg). Last encounter: 02/09/2023. Last procedure: 01/19/2023.  Reason for encounter:   Patient presents today with severe and persistent left shoulder pain.  It is overlying her deltoid and axillary nerve.  She has limited range of motion of the shoulder.  She has completed her cervical MRI which does show mild to  moderate left C3-C4 facet arthropathy that could be contributing to her neck pain as well.  She states that her shoulder pain is much more severe than her left neck pain.  She also notes discomfort in her trapezius.  She is status post left suprascapular nerve block which unfortunately was not helpful.  12/22/2022 HPI from initial clinic visit: Melissa Garrison is a pleasant 62 year old female who presents with a chief complaint of left shoulder pain, left cervical spine pain with occasional radiation into her left bicep.  She has a history of 3 left rotator cuff surgeries with Dr. Allena Katz.  She has pain with shoulder abduction.  She is also endorsing pain that radiates up her left neck and also down her left bicep.  She has a history of remote cocaine abuse, hep C but has been clean for the last 40 years.  She is managed on Flexeril and tramadol by her primary care provider.  She is being referred here from Dr. Allena Katz to consider nerve blocks for her left shoulder.  She has a cervical MRI that has been ordered by Dr. Allena Katz given concern for cervical radiculopathy as she is also having cervical spine and left bicep pain.  She states that she is hoping to change insurance providers.   ROS  Constitutional: Denies any fever or chills Gastrointestinal: No reported hemesis, hematochezia, vomiting, or acute GI distress Musculoskeletal:  Left cervical spine and shoulder pain Neurological: No reported episodes of acute onset apraxia, aphasia, dysarthria, agnosia, amnesia, paralysis, loss of coordination, or loss of consciousness  Medication Review  Blood Glucose Monitoring Suppl, OneTouch Delica Lancets 33G, Semaglutide(0.25 or 0.5MG /DOS), amLODipine, cyclobenzaprine, freestyle, lisinopril, metFORMIN,  rosuvastatin, and traMADol  History Review  Allergy: Melissa Garrison has No Known Allergies. Drug: Melissa Garrison  reports that she does not currently use drugs. Alcohol:  reports that she does not currently use alcohol after  a past usage of about 3.0 standard drinks of alcohol per week. Tobacco:  reports that she has been smoking cigarettes. She has a 5 pack-year smoking history. She has never used smokeless tobacco. Social: Melissa Garrison  reports that she has been smoking cigarettes. She has a 5 pack-year smoking history. She has never used smokeless tobacco. She reports that she does not currently use alcohol after a past usage of about 3.0 standard drinks of alcohol per week. She reports that she does not currently use drugs. Medical:  has a past medical history of Allergic rhinitis (07/11/2011), Diabetes mellitus without complication (HCC), Hepatitis C, Hepatitis C, chronic (HCC) (07/11/2011), Hyperlipidemia, Hypertension (07/11/2011), IV drug abuse (HCC), Vitamin D deficiency, and Wears dentures. Surgical: Melissa Garrison  has a past surgical history that includes Laparoscopic hysterectomy; Cholecystectomy (2011); Humerus surgery (Left); Hydradenitis excision (Right, 03/20/2017); Breast biopsy (Left, 12/05/2015); Colonoscopy (2017); Shoulder arthroscopy with subacromial decompression and open rotator cuff repair, open bicep tendon repair (Left, 01/04/2021); Capsular release (Left, 07/02/2021); Lysis of adhesion (Left, 07/02/2021); Closed manipulation shoulder with steroid injection (Left, 07/02/2021); Shoulder arthroscopy (Left, 07/02/2021); Colonoscopy with propofol (N/A, 05/20/2022); and Closed manipulation shoulder with steroid injection (Left, 08/11/2022). Family: family history includes Diabetes in her father; Hypertension in her brother and mother.  Laboratory Chemistry Profile   Renal Lab Results  Component Value Date   BUN 12 02/18/2023   CREATININE 0.65 02/18/2023   GFR 95.80 02/18/2023   GFRAA >60 03/16/2017   GFRNONAA >60 07/14/2022    Hepatic Lab Results  Component Value Date   AST 15 02/18/2023   ALT 13 02/18/2023   ALBUMIN 4.0 02/18/2023   ALKPHOS 50 02/18/2023    Electrolytes Lab Results   Component Value Date   NA 136 02/18/2023   K 4.2 02/18/2023   CL 101 02/18/2023   CALCIUM 10.3 02/18/2023    Bone Lab Results  Component Value Date   VD25OH 13.67 (L) 10/18/2021    Inflammation (CRP: Acute Phase) (ESR: Chronic Phase) No results found for: "CRP", "ESRSEDRATE", "LATICACIDVEN"       Note: Above Lab results reviewed.  Recent Imaging Review  MR CERVICAL SPINE WO CONTRAST CLINICAL DATA:  Neck pain radiating in the shoulder for over 2-1/2 years.  EXAM: MRI CERVICAL SPINE WITHOUT CONTRAST  TECHNIQUE: Multiplanar, multisequence MR imaging of the cervical spine was performed. No intravenous contrast was administered.  COMPARISON:  None Available.  FINDINGS: Alignment: Standard.  Vertebrae: No fracture, evidence of discitis, or bone lesion.  Cord: Normal signal throughout.  Posterior Fossa, vertebral arteries, paraspinal tissues: Negative.  Disc levels:  C2-3: Negative.  C3-4: Mild-to-moderate facet arthropathy is worse on the left. There is a shallow disc bulge but the central canal and foramina are open.  C4-5: Shallow disc bulge and mild uncovertebral spurring. Mild facet arthropathy. No stenosis.  C5-6: Shallow disc bulge without stenosis.  C6-7: Shallow disc bulge without stenosis.  C7-T1: Negative.  IMPRESSION: Mild cervical spondylosis without central canal or foraminal narrowing.  Electronically Signed   By: Drusilla Kanner M.D.   On: 03/04/2023 11:11 Note: Reviewed        Physical Exam  General appearance: Well nourished, well developed, and well hydrated. In no apparent acute distress Mental status: Alert, oriented x 3 (person, place, &  time)       Respiratory: No evidence of acute respiratory distress Eyes: PERLA Vitals: BP 121/70   Pulse 89   Temp (!) 97.5 F (36.4 C) (Temporal)   Ht 5\' 4"  (1.626 m)   Wt 140 lb (63.5 kg)   SpO2 100%   BMI 24.03 kg/m  BMI: Estimated body mass index is 24.03 kg/m as calculated from the  following:   Height as of this encounter: 5\' 4"  (1.626 m).   Weight as of this encounter: 140 lb (63.5 kg). Ideal: Ideal body weight: 54.7 kg (120 lb 9.5 oz) Adjusted ideal body weight: 58.2 kg (128 lb 5.7 oz)  Cervical Spine Area Exam  Skin & Axial Inspection: No masses, redness, edema, swelling, or associated skin lesions Alignment: Symmetrical Functional ROM: Pain restricted ROM, to the left Stability: No instability detected Muscle Tone/Strength: Functionally intact. No obvious neuro-muscular anomalies detected. Sensory (Neurological): MSK, faet mediated Palpation: No palpable anomalies             Upper Extremity (UE) Exam      Side: Right upper extremity   Side: Left upper extremity  Skin & Extremity Inspection: Skin color, temperature, and hair growth are WNL. No peripheral edema or cyanosis. No masses, redness, swelling, asymmetry, or associated skin lesions. No contractures.   Skin & Extremity Inspection: Skin color, temperature, and hair growth are WNL. No peripheral edema or cyanosis. No masses, redness, swelling, asymmetry, or associated skin lesions. No contractures.  Functional ROM: Unrestricted ROM           Functional ROM: Unrestricted ROM          Muscle Tone/Strength: Functionally intact. No obvious neuro-muscular anomalies detected.   Muscle Tone/Strength: Functionally intact. No obvious neuro-muscular anomalies detected.  Sensory (Neurological): Unimpaired           Sensory (Neurological): Dermatomal pain pattern          Palpation: No palpable anomalies               Palpation: No palpable anomalies              Provocative Test(s):  Phalen's test: deferred Tinel's test: deferred Apley's scratch test (touch opposite shoulder):  Action 1 (Across chest): deferred Action 2 (Overhead): deferred Action 3 (LB reach): deferred     Provocative Test(s):  Phalen's test: deferred Tinel's test: deferred Apley's scratch test (touch opposite shoulder):  Action 1 (Across chest):  deferred Action 2 (Overhead): Decreased ROM Action 3 (LB reach): Decreased ROM     Assessment   Diagnosis Status  1. Cervical spondylosis   2. Cervical facet joint syndrome   3. Injury of left axillary nerve, sequela   4. Cervicalgia   5. Chronic left shoulder pain   6. Hx of shoulder surgery   7. Left rotator cuff tear arthropathy    Having a Flare-up Having a Flare-up Having a Flare-up   Updated Problems: No problems updated.  Plan of Care  Discussed diagnostic left-sided cervical facet medial branch nerve block based upon neck pain in the context of having an MRI that shows moderate facet arthropathy at C3-C4 and C4-C5. She has severe pain overlying her left deltoid.  We discussed a left axillary nerve block.  If this is helpful, we can consider suprascapular nerve block.   Orders:  Orders Placed This Encounter  Procedures   CERVICAL FACET (MEDIAL BRANCH NERVE BLOCK)     Standing Status:   Future    Standing Expiration Date:  06/10/2023    Scheduling Instructions:     Side: Bilateral     Level: C3-4, C4-5,  Facet joints (C3, C4, C5, Medial Branch Nerves)     Sedation: PO Valium     Timeframe: As soon as schedule allows    Order Specific Question:   Where will this procedure be performed?    Answer:   [facility]   AXILLARY NERVE BLOCK    Standing Status:   Future    Standing Expiration Date:   06/10/2023    Scheduling Instructions:     PROCEDURE: Axillary Nerve Block (CPT [phone])     LATERALITY: LEFT     SEDATION: PO Valium     TIMEFRAME: ASAA    Order Specific Question:   Where will this procedure be performed?    Answer:   [facility]   TRIGGER POINT INJECTION    Standing Status:   Future    Standing Expiration Date:   06/10/2023    Scheduling Instructions:     Left trapezius TPI    Order Specific Question:   Where will this procedure be performed?    Answer:   [facility]   Follow-up plan:   Return in about 2 weeks (around  03/25/2023) for Left C3, 4 MBNB, Left Trapezius TPI, Left Axillary NB ([place]), in clinic (PO Valium).      Left SSNB 01/19/23      Recent Visits Date Type Provider Dept  02/09/23 Office Visit Edward Jolly, MD [facility]  01/19/23 Procedure visit Edward Jolly, MD [facility]  12/22/22 Office Visit Edward Jolly, MD [facility]  Showing recent visits within past 90 days and meeting all other requirements Today's Visits Date Type Provider Dept  03/11/23 Office Visit Edward Jolly, MD [facility]  Showing today's visits and meeting all other requirements Future Appointments No visits were found meeting these conditions. Showing future appointments within next 90 days and meeting all other requirements  I discussed the assessment and treatment plan with the patient. The patient was provided an opportunity to ask questions and all were answered. The patient agreed with the plan and demonstrated an understanding of the instructions.  Patient advised to call back or seek an in-person evaluation if the symptoms or condition worsens.  Duration of encounter: .  Total time on encounter, as per AMA guidelines included both the face-to-face and non-face-to-face time personally spent by the physician and/or other qualified health care professional(s) on the day of the encounter (includes time in activities that require the physician or other qualified health care professional and does not include time in activities normally performed by clinical staff). Physician's time may include the following activities when performed: Preparing to see the patient (e.g., pre-charting review of records, searching for previously ordered imaging, lab work, and nerve conduction tests) Review of prior analgesic pharmacotherapies. Reviewing PMP Interpreting ordered tests (e.g., lab work, imaging, nerve conduction tests) Performing post-procedure evaluations, including  interpretation of diagnostic procedures Obtaining and/or reviewing separately obtained history Performing a medically appropriate examination and/or evaluation Counseling and educating the patient/family/caregiver Ordering medications, tests, or procedures Referring and communicating with other health care professionals (when not separately reported) Documenting clinical information in the electronic or other health record Independently interpreting results (not separately reported) and communicating results to the patient/ family/caregiver Care coordination (not separately reported)  Note by: Edward Jolly, MD Date: 03/11/2023; Time: 1:14 PM

## 2023-03-11 NOTE — Patient Instructions (Signed)
  ______________________________________________________________________    Procedure instructions  Stop blood-thinners  Do not eat or drink fluids (other than water) for 6 hours before your procedure  No water for 2 hours before your procedure  Take your blood pressure medicine with a sip of water  Arrive 30 minutes before your appointment  If sedation is planned, bring suitable driver. Pennie Banter, Benedetto Goad, & public transportation are NOT APPROVED)  Carefully read the "Preparing for your procedure" detailed instructions  If you have questions call us at [phone]  ______________________________________________________________________

## 2023-03-11 NOTE — Progress Notes (Signed)
Safety precautions to be maintained throughout the outpatient stay will include: orient to surroundings, keep bed in low position, maintain call bell within reach at all times, provide assistance with transfer out of bed and ambulation.  

## 2023-03-12 ENCOUNTER — Telehealth: Payer: Self-pay

## 2023-03-12 NOTE — Telephone Encounter (Signed)
Post procedure follow up. Patient states she is doing fine.  

## 2023-03-20 LAB — HM DIABETES EYE EXAM

## 2023-03-24 ENCOUNTER — Other Ambulatory Visit (INDEPENDENT_AMBULATORY_CARE_PROVIDER_SITE_OTHER): Payer: Medicare Other

## 2023-03-24 DIAGNOSIS — I10 Essential (primary) hypertension: Secondary | ICD-10-CM

## 2023-03-24 DIAGNOSIS — D649 Anemia, unspecified: Secondary | ICD-10-CM | POA: Diagnosis not present

## 2023-03-24 LAB — URINALYSIS, ROUTINE W REFLEX MICROSCOPIC
Bilirubin Urine: NEGATIVE
Hgb urine dipstick: NEGATIVE
Ketones, ur: NEGATIVE
Leukocytes,Ua: NEGATIVE
Nitrite: NEGATIVE
Specific Gravity, Urine: 1.02 (ref 1.000–1.030)
Total Protein, Urine: NEGATIVE
Urine Glucose: NEGATIVE
Urobilinogen, UA: 0.2 (ref 0.0–1.0)
pH: 6 (ref 5.0–8.0)

## 2023-03-24 LAB — CBC WITH DIFFERENTIAL/PLATELET
Basophils Absolute: 0 10*3/uL (ref 0.0–0.1)
Basophils Relative: 0.2 % (ref 0.0–3.0)
Eosinophils Absolute: 0.1 10*3/uL (ref 0.0–0.7)
Eosinophils Relative: 1.2 % (ref 0.0–5.0)
HCT: 33.7 % — ABNORMAL LOW (ref 36.0–46.0)
Hemoglobin: 11.2 g/dL — ABNORMAL LOW (ref 12.0–15.0)
Lymphocytes Relative: 47.8 % — ABNORMAL HIGH (ref 12.0–46.0)
Lymphs Abs: 3.8 10*3/uL (ref 0.7–4.0)
MCHC: 33.1 g/dL (ref 30.0–36.0)
MCV: 84 fL (ref 78.0–100.0)
Monocytes Absolute: 0.5 10*3/uL (ref 0.1–1.0)
Monocytes Relative: 6.5 % (ref 3.0–12.0)
Neutro Abs: 3.6 10*3/uL (ref 1.4–7.7)
Neutrophils Relative %: 44.3 % (ref 43.0–77.0)
Platelets: 362 10*3/uL (ref 150.0–400.0)
RBC: 4.01 Mil/uL (ref 3.87–5.11)
RDW: 14.1 % (ref 11.5–15.5)
WBC: 8 10*3/uL (ref 4.0–10.5)

## 2023-03-24 LAB — IBC + FERRITIN
Ferritin: 265.7 ng/mL (ref 10.0–291.0)
Iron: 86 ug/dL (ref 42–145)
Saturation Ratios: 26.8 % (ref 20.0–50.0)
TIBC: 320.6 ug/dL (ref 250.0–450.0)
Transferrin: 229 mg/dL (ref 212.0–360.0)

## 2023-03-30 ENCOUNTER — Ambulatory Visit
Admission: RE | Admit: 2023-03-30 | Discharge: 2023-03-30 | Disposition: A | Discharge status: Home / Self Care | Payer: Medicaid Other | Source: Ambulatory Visit | Attending: Student in an Organized Health Care Education/Training Program | Admitting: Student in an Organized Health Care Education/Training Program

## 2023-03-30 ENCOUNTER — Ambulatory Visit
Payer: Medicare Other | Attending: Student in an Organized Health Care Education/Training Program | Admitting: Student in an Organized Health Care Education/Training Program

## 2023-03-30 ENCOUNTER — Encounter: Payer: Self-pay | Admitting: Student in an Organized Health Care Education/Training Program

## 2023-03-30 VITALS — BP 140/69 | HR 89 | Temp 97.2°F | Resp 24 | Ht 64.0 in | Wt 140.0 lb

## 2023-03-30 DIAGNOSIS — M542 Cervicalgia: Secondary | ICD-10-CM | POA: Diagnosis not present

## 2023-03-30 DIAGNOSIS — M25512 Pain in left shoulder: Secondary | ICD-10-CM | POA: Diagnosis not present

## 2023-03-30 DIAGNOSIS — M7918 Myalgia, other site: Secondary | ICD-10-CM | POA: Diagnosis not present

## 2023-03-30 DIAGNOSIS — S4432XS Injury of axillary nerve, left arm, sequela: Secondary | ICD-10-CM

## 2023-03-30 DIAGNOSIS — M47812 Spondylosis without myelopathy or radiculopathy, cervical region: Secondary | ICD-10-CM | POA: Diagnosis not present

## 2023-03-30 DIAGNOSIS — G588 Other specified mononeuropathies: Secondary | ICD-10-CM | POA: Insufficient documentation

## 2023-03-30 MED ORDER — ROPIVACAINE HCL 2 MG/ML IJ SOLN
INTRAMUSCULAR | Status: AC
  Filled 2023-03-30: qty 40

## 2023-03-30 MED ORDER — ROPIVACAINE HCL 2 MG/ML IJ SOLN
18.0000 mL | Freq: Once | INTRAMUSCULAR | Status: AC
  Administered 2023-03-30: 18 mL via PERINEURAL

## 2023-03-30 MED ORDER — LIDOCAINE HCL 2 % IJ SOLN
20.0000 mL | Freq: Once | INTRAMUSCULAR | Status: AC
  Administered 2023-03-30: 400 mg

## 2023-03-30 MED ORDER — DEXAMETHASONE SODIUM PHOSPHATE 10 MG/ML IJ SOLN
10.0000 mg | Freq: Once | INTRAMUSCULAR | Status: AC
  Administered 2023-03-30: 10 mg

## 2023-03-30 MED ORDER — DIAZEPAM 5 MG PO TABS
5.0000 mg | ORAL_TABLET | ORAL | Status: AC
  Administered 2023-03-30: 5 mg via ORAL

## 2023-03-30 MED ORDER — LIDOCAINE HCL 2 % IJ SOLN
INTRAMUSCULAR | Status: AC
  Filled 2023-03-30: qty 20

## 2023-03-30 MED ORDER — DIAZEPAM 5 MG PO TABS
ORAL_TABLET | ORAL | Status: AC
  Filled 2023-03-30: qty 1

## 2023-03-30 MED ORDER — DEXAMETHASONE SODIUM PHOSPHATE 10 MG/ML IJ SOLN
INTRAMUSCULAR | Status: AC
  Filled 2023-03-30: qty 2

## 2023-03-30 NOTE — Progress Notes (Signed)
PROVIDER NOTE: Interpretation of information contained herein should be left to medically-trained personnel. Specific patient instructions are provided elsewhere under "Patient Instructions" section of medical record. This document was created in part using STT-dictation technology, any transcriptional errors that may result from this process are unintentional.  Patient: Melissa Garrison Type: Established DOB: Apr 17, 1961 MRN: [id number] PCP: Allegra Grana, FNP  Service: Procedure DOS: 03/30/2023 Setting: Ambulatory Location: Ambulatory outpatient facility Delivery: Face-to-face Provider: Edward Jolly, MD Specialty: Interventional Pain Management Specialty designation: 09 Location: Outpatient facility Ref. Prov.: Edward Jolly, MD       Interventional Therapy   Procedure: Cervical Facet Medial Branch Block(s) #1 and Left Trapzeius TPI Laterality: Left  Level: C4 and C5 Medial Branch Level(s). Injecting these levels blocks the C4-5 cervical facet joints.  Imaging: Fluoroscopic guidance Anesthesia: Local anesthesia (1-2% Lidocaine) Sedation: Minimal Sedation                       DOS: 03/30/2023  Performed by: Edward Jolly, MD  Purpose: Diagnostic/Therapeutic Indications: Cervicalgia (cervical spine axial pain) severe enough to impact quality of life or function. Cervical facet joint syndrome, trapezius trigger point   NAS-11 Pain score:   Pre-procedure: 8 /10   Post-procedure: 0-No pain/10     Position / Prep / Materials:  Position: Prone. Head in cradle. C-spine slightly flexed. Prep solution: DuraPrep (Iodine Povacrylex [0.7% available iodine] and Isopropyl Alcohol, 74% w/w) Prep Area: Posterior Cervico-thoracic Region. From occipital ridge to tip of scapula, and from shoulder to shoulder. Entire posterior and lateral neck surface. Materials:  Tray: Block Needle(s):  Type: Spinal  Gauge (G): 22  Length: 3.5-in  Qty:  1      H&P (Pre-op Assessment):  Melissa Garrison is a  62 y.o. (year old), female patient, seen today for interventional treatment. She  has a past surgical history that includes Laparoscopic hysterectomy; Cholecystectomy (2011); Humerus surgery (Left); Hydradenitis excision (Right, 03/20/2017); Breast biopsy (Left, 12/05/2015); Colonoscopy (2017); Shoulder arthroscopy with subacromial decompression and open rotator cuff repair, open bicep tendon repair (Left, 01/04/2021); Capsular release (Left, 07/02/2021); Lysis of adhesion (Left, 07/02/2021); Closed manipulation shoulder with steroid injection (Left, 07/02/2021); Shoulder arthroscopy (Left, 07/02/2021); Colonoscopy with propofol (N/A, 05/20/2022); and Closed manipulation shoulder with steroid injection (Left, 08/11/2022). Melissa Garrison has a current medication list which includes the following prescription(s): amlodipine, blood glucose monitoring suppl, cyclobenzaprine, freestyle, lisinopril, metformin, onetouch delica lancets 33g, rosuvastatin, semaglutide(0.25 or 0.5mg /dos), and tramadol. Her primarily concern today is the Shoulder Pain (left)  Initial Vital Signs:  Pulse/HCG Rate: 89ECG Heart Rate: 90 Temp: (!) 97.2 F (36.2 C) Resp: 16 BP: 125/72 SpO2: 100 %  BMI: Estimated body mass index is 24.03 kg/m as calculated from the following:   Height as of this encounter: 5\' 4"  (1.626 m).   Weight as of this encounter: 140 lb (63.5 kg).  Risk Assessment: Allergies: Reviewed. She has No Known Allergies.  Allergy Precautions: None required Coagulopathies: Reviewed. None identified.  Blood-thinner therapy: None at this time Active Infection(s): Reviewed. None identified. Melissa Garrison is afebrile  Site Confirmation: Melissa Garrison was asked to confirm the procedure and laterality before marking the site Procedure checklist: Completed Consent: Before the procedure and under the influence of no sedative(s), amnesic(s), or anxiolytics, the patient was informed of the treatment options, risks and possible  complications. To fulfill our ethical and legal obligations, as recommended by the American Medical Association's Code of Ethics, I have informed the patient of my clinical impression; the nature  and purpose of the treatment or procedure; the risks, benefits, and possible complications of the intervention; the alternatives, including doing nothing; the risk(s) and benefit(s) of the alternative treatment(s) or procedure(s); and the risk(s) and benefit(s) of doing nothing. The patient was provided information about the general risks and possible complications associated with the procedure. These may include, but are not limited to: failure to achieve desired goals, infection, bleeding, organ or nerve damage, allergic reactions, paralysis, and death. In addition, the patient was informed of those risks and complications associated to Spine-related procedures, such as failure to decrease pain; infection (i.e.: Meningitis, epidural or intraspinal abscess); bleeding (i.e.: epidural hematoma, subarachnoid hemorrhage, or any other type of intraspinal or peri-dural bleeding); organ or nerve damage (i.e.: Any type of peripheral nerve, nerve root, or spinal cord injury) with subsequent damage to sensory, motor, and/or autonomic systems, resulting in permanent pain, numbness, and/or weakness of one or several areas of the body; allergic reactions; (i.e.: anaphylactic reaction); and/or death. Furthermore, the patient was informed of those risks and complications associated with the medications. These include, but are not limited to: allergic reactions (i.e.: anaphylactic or anaphylactoid reaction(s)); adrenal axis suppression; blood sugar elevation that in diabetics may result in ketoacidosis or comma; water retention that in patients with history of congestive heart failure may result in shortness of breath, pulmonary edema, and decompensation with resultant heart failure; weight gain; swelling or edema; medication-induced  neural toxicity; particulate matter embolism and blood vessel occlusion with resultant organ, and/or nervous system infarction; and/or aseptic necrosis of one or more joints. Finally, the patient was informed that Medicine is not an exact science; therefore, there is also the possibility of unforeseen or unpredictable risks and/or possible complications that may result in a catastrophic outcome. The patient indicated having understood very clearly. We have given the patient no guarantees and we have made no promises. Enough time was given to the patient to ask questions, all of which were answered to the patient's satisfaction. Ms. Eib has indicated that she wanted to continue with the procedure. Attestation: I, the ordering provider, attest that I have discussed with the patient the benefits, risks, side-effects, alternatives, likelihood of achieving goals, and potential problems during recovery for the procedure that I have provided informed consent. Date  Time: 03/30/2023  9:42 AM   Pre-Procedure Preparation:  Monitoring: As per clinic protocol. Respiration, ETCO2, SpO2, BP, heart rate and rhythm monitor placed and checked for adequate function Safety Precautions: Patient was assessed for positional comfort and pressure points before starting the procedure. Time-out: I initiated and conducted the "Time-out" before starting the procedure, as per protocol. The patient was asked to participate by confirming the accuracy of the "Time Out" information. Verification of the correct person, site, and procedure were performed and confirmed by me, the nursing staff, and the patient. "Time-out" conducted as per Joint Commission's Universal Protocol (UP.01.01.01). Time: 1020 Start Time: 1020 hrs.  Description/Narrative of Procedure:          Laterality: See above. Targeted Levels: See above.  Rationale (medical necessity): procedure needed and proper for the diagnosis and/or treatment of the patient's  medical symptoms and needs. Procedural Technique Safety Precautions: Aspiration looking for blood return was conducted prior to all injections. At no point did we inject any substances, as a needle was being advanced. No attempts were made at seeking any paresthesias. Safe injection practices and needle disposal techniques used. Medications properly checked for expiration dates. SDV (single dose vial) medications used. Description of the  Procedure: Protocol guidelines were followed. The patient was assisted into a comfortable position. The target area was identified and the area prepped in the usual manner. Skin & deeper tissues infiltrated with local anesthetic. Appropriate amount of time allowed to pass for local anesthetics to take effect. The procedure needles were then advanced to the target area. Proper needle placement secured. Negative aspiration confirmed. Solution injected in intermittent fashion, asking for systemic symptoms every 0.5cc of injectate. The needles were then removed and the area cleansed, making sure to leave some of the prepping solution back to take advantage of its long term bactericidal properties.  Technical description of process:   C3 Medial Branch Nerve Block (MBB): The target area for the C3 dorsal medial articular branch is the lateral concave waist of the articular pillar of C3. Under fluoroscopic guidance, a Quincke needle was inserted until contact was made with os over the postero-lateral aspect of the articular pillar of C3 (target area). After negative aspiration for blood, 2 mL of the nerve block solution was injected without difficulty or complication. The needle was removed intact. C4 Medial Branch Nerve Block (MBB): The target area for the C4 dorsal medial articular branch is the lateral concave waist of the articular pillar of C4. Under fluoroscopic guidance, a Quincke needle was inserted until contact was made with os over the postero-lateral aspect of the  articular pillar of C4 (target area). After negative aspiration for blood,2 mL of the nerve block solution was injected without difficulty or complication. The needle was removed intact.  Afterwards trigger point injections in the left trapezius and periscapular region and left deltoid were performed.  0.5 to 1 cc of 0.2% ropivacaine was injected into each trigger point with needling performed.   Once the entire procedure was completed, the treated area was cleaned, making sure to leave some of the prepping solution back to take advantage of its long term bactericidal properties.  Anatomy Reference Guide:      Facet Joint Innervation  C1-2 Third occipital Nerve (TON)  C2-3 TON, C3  Medial Branch  C3-4 C3, C4         "          "  C4-5 C4, C5         "          "  C5-6 C5, C6         "          "  C6-7 C6, C7         "          "  C7-T1 C7, C8         "          "   Cervical Facet Pain Pattern overlap:   Vitals:   03/30/23 1015 03/30/23 1020 03/30/23 1025 03/30/23 1029  BP: 115/74 116/71 (!) 152/89 (!) 140/69  Pulse:      Resp: 20 16 18  (!) 24  Temp:      SpO2: 100% 100% 100% 100%  Weight:      Height:         Start Time: 1020 hrs. End Time: 1028 hrs.  Imaging Guidance (Spinal):          Type of Imaging Technique: Fluoroscopy Guidance (Spinal) Indication(s): Assistance in needle guidance and placement for procedures requiring needle placement in or near specific anatomical locations not easily accessible without such assistance. Exposure Time: Please see nurses notes. Contrast: None used.  Fluoroscopic Guidance: I was personally present during the use of fluoroscopy. "Tunnel Vision Technique" used to obtain the best possible view of the target area. Parallax error corrected before commencing the procedure. "Direction-depth-direction" technique used to introduce the needle under continuous pulsed fluoroscopy. Once target was reached, antero-posterior, oblique, and lateral  fluoroscopic projection used confirm needle placement in all planes. Images permanently stored in EMR. Interpretation: No contrast injected. I personally interpreted the imaging intraoperatively. Adequate needle placement confirmed in multiple planes. Permanent images saved into the patient's record.  Post-operative Assessment:  Post-procedure Vital Signs:  Pulse/HCG Rate: 8994 Temp: (!) 97.2 F (36.2 C) Resp: (!) 24 BP: (!) 140/69 SpO2: 100 %  EBL: None  Complications: No immediate post-treatment complications observed by team, or reported by patient.  Note: The patient tolerated the entire procedure well. A repeat set of vitals were taken after the procedure and the patient was kept under observation following institutional policy, for this type of procedure. Post-procedural neurological assessment was performed, showing return to baseline, prior to discharge. The patient was provided with post-procedure discharge instructions, including a section on how to identify potential problems. Should any problems arise concerning this procedure, the patient was given instructions to immediately contact us, at any time, without hesitation. In any case, we plan to contact the patient by telephone for a follow-up status report regarding this interventional procedure.  Comments:  No additional relevant information.  Plan of Care (POC)  Orders:  Orders Placed This Encounter  Procedures   DG PAIN [facility] C-ARM 1-60 MIN NO REPORT    Intraoperative interpretation by procedural physician at [facility].    Standing Status:   Standing    Number of Occurrences:   1    Order Specific Question:   Reason for exam:    Answer:   Assistance in needle guidance and placement for procedures requiring needle placement in or near specific anatomical locations not easily accessible without such assistance.     Medications ordered for procedure: Meds ordered this encounter  Medications   lidocaine  (XYLOCAINE) 2 % (with pres) injection 400 mg   diazepam (VALIUM) tablet 5 mg    Make sure Flumazenil is available in the pyxis when using this medication. If oversedation occurs, administer 0.2 mg IV over 15 sec. If after 45 sec no response, administer 0.2 mg again over 1 min; may repeat at 1 min intervals; not to exceed 4 doses (1 mg)   dexamethasone (DECADRON) injection 10 mg   dexamethasone (DECADRON) injection 10 mg   ropivacaine (PF) 2 mg/mL (0.2%) (NAROPIN) injection 18 mL   Medications administered: We administered lidocaine, diazepam, dexamethasone, dexamethasone, and ropivacaine (PF) 2 mg/mL (0.2%).  See the medical record for exact dosing, route, and time of administration.  Follow-up plan:   Return in about 3 weeks (around 04/20/2023) for PPE F2F.       Left SSNB 01/19/23, Left C3,4 MBNB, Left axillary nerve block     Recent Visits Date Type Provider Dept  03/11/23 Office Visit Edward Jolly, MD [facility]  02/09/23 Office Visit Edward Jolly, MD [facility]  01/19/23 Procedure visit Edward Jolly, MD [facility]  Showing recent visits within past 90 days and meeting all other requirements Today's Visits Date Type Provider Dept  03/30/23 Procedure visit Edward Jolly, MD [facility]  Showing today's visits and meeting all other requirements Future Appointments Date Type Provider Dept  05/04/23 Appointment Edward Jolly, MD [facility]  Showing  future appointments within next 90 days and meeting all other requirements  Disposition: Discharge home  Discharge (Date  Time): 03/30/2023; 1035 hrs.   Primary Care Physician: Allegra Grana, FNP Location: [facility] Note by: Edward Jolly, MD (TTS technology used. I apologize for any typographical errors that were not detected and corrected.) Date: 03/30/2023; Time: 10:43 AM  Disclaimer:  Medicine is not an Visual merchandiser. The only guarantee  in medicine is that nothing is guaranteed. It is important to note that the decision to proceed with this intervention was based on the information collected from the patient. The Data and conclusions were drawn from the patient's questionnaire, the interview, and the physical examination. Because the information was provided in large part by the patient, it cannot be guaranteed that it has not been purposely or unconsciously manipulated. Every effort has been made to obtain as much relevant data as possible for this evaluation. It is important to note that the conclusions that lead to this procedure are derived in large part from the available data. Always take into account that the treatment will also be dependent on availability of resources and existing treatment guidelines, considered by other Pain Management Practitioners as being common knowledge and practice, at the time of the intervention. For Medico-Legal purposes, it is also important to point out that variation in procedural techniques and pharmacological choices are the acceptable norm. The indications, contraindications, technique, and results of the above procedure should only be interpreted and judged by a Board-Certified Interventional Pain Specialist with extensive familiarity and expertise in the same exact procedure and technique.

## 2023-03-30 NOTE — Patient Instructions (Signed)

## 2023-03-30 NOTE — Progress Notes (Signed)
PROVIDER NOTE: Interpretation of information contained herein should be left to medically-trained personnel. Specific patient instructions are provided elsewhere under "Patient Instructions" section of medical record. This document was created in part using STT-dictation technology, any transcriptional errors that may result from this process are unintentional.  Patient: Melissa Garrison Type: Established DOB: 02-12-1961 MRN: [id number] PCP: Allegra Grana, FNP  Service: Procedure DOS: 03/30/2023 Setting: Ambulatory Location: Ambulatory outpatient facility Delivery: Face-to-face Provider: Edward Jolly, MD Specialty: Interventional Pain Management Specialty designation: 09 Location: Outpatient facility Ref. Prov.: Edward Jolly, MD       Interventional Therapy   Procedure: Axillary nerve block  #1  Laterality:  Left  Level: Approximately 6 cm inferior to the left acromion Imaging: Fluoroscopic guidance         Anesthesia: Local anesthesia (1-2% Lidocaine) Sedation: Minimal Sedation                       DOS: 03/30/2023  Performed by: Edward Jolly, MD  Purpose: Diagnostic/Therapeutic Indications: Shoulder pain, severe enough to impact quality of life and/or function. 1. Injury of left axillary nerve, sequela   2. Cervical spondylosis   3. Cervical facet joint syndrome   4. Cervicalgia    NAS-11 score:   Pre-procedure: 8 /10   Post-procedure: 0-No pain/10     Target: Axillary nerve, terminal branches Location: Inferior to acromion Region: left deltoid Approach: Percutaneous  Neuroanatomy: Target region is a middle of the deltoid muscle inferior to acromion   Position / Prep / Materials:  Position: Prone Materials:  Tray: Block Needle(s):  Type: Spinal  Gauge (G): 22  Length: 3.5 in.  Qty: 1 Prep solution: DuraPrep (Iodine Povacrylex [0.7% available iodine] and Isopropyl Alcohol, 74% w/w) Prep Area: Entire posterior shoulder area. From upper spine to shoulder proper  (upper arm), and from lateral neck to lower tip of shoulder blade.   H&P (Pre-op Assessment):  Ms. Getman is a 62 y.o. (year old), female patient, seen today for interventional treatment. She  has a past surgical history that includes Laparoscopic hysterectomy; Cholecystectomy (2011); Humerus surgery (Left); Hydradenitis excision (Right, 03/20/2017); Breast biopsy (Left, 12/05/2015); Colonoscopy (2017); Shoulder arthroscopy with subacromial decompression and open rotator cuff repair, open bicep tendon repair (Left, 01/04/2021); Capsular release (Left, 07/02/2021); Lysis of adhesion (Left, 07/02/2021); Closed manipulation shoulder with steroid injection (Left, 07/02/2021); Shoulder arthroscopy (Left, 07/02/2021); Colonoscopy with propofol (N/A, 05/20/2022); and Closed manipulation shoulder with steroid injection (Left, 08/11/2022). Ms. Kroh has a current medication list which includes the following prescription(s): amlodipine, blood glucose monitoring suppl, cyclobenzaprine, freestyle, lisinopril, metformin, onetouch delica lancets 33g, rosuvastatin, semaglutide(0.25 or 0.5mg /dos), and tramadol. Her primarily concern today is the Shoulder Pain (left)  Initial Vital Signs:  Pulse/HCG Rate: 89ECG Heart Rate: 90 Temp: (!) 97.2 F (36.2 C) Resp: 16 BP: 125/72 SpO2: 100 %  BMI: Estimated body mass index is 24.03 kg/m as calculated from the following:   Height as of this encounter: 5\' 4"  (1.626 m).   Weight as of this encounter: 140 lb (63.5 kg).  Risk Assessment: Allergies: Reviewed. She has No Known Allergies.  Allergy Precautions: None required Coagulopathies: Reviewed. None identified.  Blood-thinner therapy: None at this time Active Infection(s): Reviewed. None identified. Ms. Plumer is afebrile  Site Confirmation: Ms. Coville was asked to confirm the procedure and laterality before marking the site Procedure checklist: Completed Consent: Before the procedure and under the influence of  no sedative(s), amnesic(s), or anxiolytics, the patient was informed of the treatment options,  risks and possible complications. To fulfill our ethical and legal obligations, as recommended by the American Medical Association's Code of Ethics, I have informed the patient of my clinical impression; the nature and purpose of the treatment or procedure; the risks, benefits, and possible complications of the intervention; the alternatives, including doing nothing; the risk(s) and benefit(s) of the alternative treatment(s) or procedure(s); and the risk(s) and benefit(s) of doing nothing. The patient was provided information about the general risks and possible complications associated with the procedure. These may include, but are not limited to: failure to achieve desired goals, infection, bleeding, organ or nerve damage, allergic reactions, paralysis, and death. In addition, the patient was informed of those risks and complications associated to the procedure, such as failure to decrease pain; infection; bleeding; organ or nerve damage with subsequent damage to sensory, motor, and/or autonomic systems, resulting in permanent pain, numbness, and/or weakness of one or several areas of the body; allergic reactions; (i.e.: anaphylactic reaction); and/or death. Furthermore, the patient was informed of those risks and complications associated with the medications. These include, but are not limited to: allergic reactions (i.e.: anaphylactic or anaphylactoid reaction(s)); adrenal axis suppression; blood sugar elevation that in diabetics may result in ketoacidosis or comma; water retention that in patients with history of congestive heart failure may result in shortness of breath, pulmonary edema, and decompensation with resultant heart failure; weight gain; swelling or edema; medication-induced neural toxicity; particulate matter embolism and blood vessel occlusion with resultant organ, and/or nervous system infarction;  and/or aseptic necrosis of one or more joints. Finally, the patient was informed that Medicine is not an exact science; therefore, there is also the possibility of unforeseen or unpredictable risks and/or possible complications that may result in a catastrophic outcome. The patient indicated having understood very clearly. We have given the patient no guarantees and we have made no promises. Enough time was given to the patient to ask questions, all of which were answered to the patient's satisfaction. Ms. Degood has indicated that she wanted to continue with the procedure. Attestation: I, the ordering provider, attest that I have discussed with the patient the benefits, risks, side-effects, alternatives, likelihood of achieving goals, and potential problems during recovery for the procedure that I have provided informed consent. Date  Time: 03/30/2023  9:42 AM  Pre-Procedure Preparation:  Monitoring: As per clinic protocol. Respiration, ETCO2, SpO2, BP, heart rate and rhythm monitor placed and checked for adequate function Safety Precautions: Patient was assessed for positional comfort and pressure points before starting the procedure. Time-out: I initiated and conducted the "Time-out" before starting the procedure, as per protocol. The patient was asked to participate by confirming the accuracy of the "Time Out" information. Verification of the correct person, site, and procedure were performed and confirmed by me, the nursing staff, and the patient. "Time-out" conducted as per Joint Commission's Universal Protocol (UP.01.01.01). Time: 1020 Start Time: 1020 hrs.  Description of Procedure:            The acromion was palpated on the left, 6 cm below, injection was performed.  The humerus was contacted with spinal needle and retracted 1 to 2 cm.  Patient felt paresthesias over her left deltoid region in the area of her axillary nerve.  5 cc solution made of 4 cc of 0.2% ropivacaine, 1 cc of Decadron  10 mg/cc was injected.  Vitals:   03/30/23 1015 03/30/23 1020 03/30/23 1025 03/30/23 1029  BP: 115/74 116/71 (!) 152/89 (!) 140/69  Pulse:  Resp: 20 16 18  (!) 24  Temp:      SpO2: 100% 100% 100% 100%  Weight:      Height:         Start Time: 1020 hrs. End Time: 1028 hrs.  Imaging Guidance (Spinal):          Type of Imaging Technique: Fluoroscopy Guidance (Spinal) Indication(s): Assistance in needle guidance and placement for procedures requiring needle placement in or near specific anatomical locations not easily accessible without such assistance. Exposure Time: Please see nurses notes. Contrast: None used. Fluoroscopic Guidance: I was personally present during the use of fluoroscopy. "Tunnel Vision Technique" used to obtain the best possible view of the target area. Parallax error corrected before commencing the procedure. "Direction-depth-direction" technique used to introduce the needle under continuous pulsed fluoroscopy. Once target was reached, antero-posterior, oblique, and lateral fluoroscopic projection used confirm needle placement in all planes. Images permanently stored in EMR. Interpretation: No contrast injected. I personally interpreted the imaging intraoperatively. Adequate needle placement confirmed in multiple planes. Permanent images saved into the patient's record.  Antibiotic Prophylaxis:   Anti-infectives (From admission, onward)    None      Indication(s): None identified  Post-operative Assessment:  Post-procedure Vital Signs:  Pulse/HCG Rate: 8994 Temp: (!) 97.2 F (36.2 C) Resp: (!) 24 BP: (!) 140/69 SpO2: 100 %  EBL: None  Complications: No immediate post-treatment complications observed by team, or reported by patient.  Note: The patient tolerated the entire procedure well. A repeat set of vitals were taken after the procedure and the patient was kept under observation following institutional policy, for this type of procedure.  Post-procedural neurological assessment was performed, showing return to baseline, prior to discharge. The patient was provided with post-procedure discharge instructions, including a section on how to identify potential problems. Should any problems arise concerning this procedure, the patient was given instructions to immediately contact us, at any time, without hesitation. In any case, we plan to contact the patient by telephone for a follow-up status report regarding this interventional procedure.  Comments:  No additional relevant information.  Plan of Care (POC)  Orders:  Orders Placed This Encounter  Procedures   DG PAIN CLIN[facility] C-ARM 1-60 MIN NO REPORT    Intraoperative interpretation by procedural physician at [facility].    Standing Status:   Standing    Number of Occurrences:   1    Order Specific Question:   Reason for exam:    Answer:   Assistance in needle guidance and placement for procedures requiring needle placement in or near specific anatomical locations not easily accessible without such assistance.     Medications ordered for procedure: Meds ordered this encounter  Medications   lidocaine (XYLOCAINE) 2 % (with pres) injection 400 mg   diazepam (VALIUM) tablet 5 mg    Make sure Flumazenil is available in the pyxis when using this medication. If oversedation occurs, administer 0.2 mg IV over 15 sec. If after 45 sec no response, administer 0.2 mg again over 1 min; may repeat at 1 min intervals; not to exceed 4 doses (1 mg)   dexamethasone (DECADRON) injection 10 mg   dexamethasone (DECADRON) injection 10 mg   ropivacaine (PF) 2 mg/mL (0.2%) (NAROPIN) injection 18 mL   Medications administered: We administered lidocaine, diazepam, dexamethasone, dexamethasone, and ropivacaine (PF) 2 mg/mL (0.2%).  See the medical record for exact dosing, route, and time of administration.  Follow-up plan:   Return in about 3 weeks (around  04/20/2023) for PPE F2F.       Recent Visits Date Type Provider Dept  03/11/23 Office Visit Edward Jolly, MD [facility]  02/09/23 Office Visit Edward Jolly, MD [facility]  01/19/23 Procedure visit Edward Jolly, MD [facility]  Showing recent visits within past 90 days and meeting all other requirements Today's Visits Date Type Provider Dept  03/30/23 Procedure visit Edward Jolly, MD [facility]  Showing today's visits and meeting all other requirements Future Appointments Date Type Provider Dept  05/04/23 Appointment Edward Jolly, MD [facility]  Showing future appointments within next 90 days and meeting all other requirements  Disposition: Discharge home  Discharge (Date  Time): 03/30/2023; 1035 hrs.   Primary Care Physician: Allegra Grana, FNP Location: [facility] Note by: Edward Jolly, MD (TTS technology used. I apologize for any typographical errors that were not detected and corrected.) Date: 03/30/2023; Time: 11:32 AM  Disclaimer:  Medicine is not an Visual merchandiser. The only guarantee in medicine is that nothing is guaranteed. It is important to note that the decision to proceed with this intervention was based on the information collected from the patient. The Data and conclusions were drawn from the patient's questionnaire, the interview, and the physical examination. Because the information was provided in large part by the patient, it cannot be guaranteed that it has not been purposely or unconsciously manipulated. Every effort has been made to obtain as much relevant data as possible for this evaluation. It is important to note that the conclusions that lead to this procedure are derived in large part from the available data. Always take into account that the treatment will also be dependent on availability of resources and existing treatment guidelines, considered by other Pain Management Practitioners as being  common knowledge and practice, at the time of the intervention. For Medico-Legal purposes, it is also important to point out that variation in procedural techniques and pharmacological choices are the acceptable norm. The indications, contraindications, technique, and results of the above procedure should only be interpreted and judged by a Board-Certified Interventional Pain Specialist with extensive familiarity and expertise in the same exact procedure and technique.

## 2023-03-30 NOTE — Progress Notes (Signed)
Safety precautions to be maintained throughout the outpatient stay will include: orient to surroundings, keep bed in low position, maintain call bell within reach at all times, provide assistance with transfer out of bed and ambulation.  

## 2023-04-01 ENCOUNTER — Telehealth: Payer: Self-pay

## 2023-04-01 NOTE — Telephone Encounter (Signed)
Post procedure follow up.  Patient states she is doing well.   ?

## 2023-04-07 NOTE — Addendum Note (Signed)
Addended by: [place], Aarti Mankowski on: 04/07/2023 11:14 AM   Modules accepted: Orders

## 2023-04-15 ENCOUNTER — Telehealth: Payer: Self-pay | Admitting: Family

## 2023-04-15 DIAGNOSIS — M542 Cervicalgia: Secondary | ICD-10-CM

## 2023-04-15 NOTE — Telephone Encounter (Signed)
Prescription Request  04/15/2023  LOV: 02/18/2023  What is the name of the medication or equipment? FLEXERIL   Have you contacted your pharmacy to request a refill? No   Which pharmacy would you like this sent to? [facility]   Patient notified that their request is being sent to the clinical staff for review and that they should receive a response within 2 business days.   Please advise at Mobile [phone] (mobile)

## 2023-04-16 MED ORDER — CYCLOBENZAPRINE HCL 5 MG PO TABS: 5.0000 mg | ORAL_TABLET | Freq: Three times a day (TID) | ORAL | 1 refills | Status: DC | PRN

## 2023-04-16 NOTE — Addendum Note (Signed)
Addended by: Allegra Grana on: 04/16/2023 12:49 PM   Modules accepted: Orders

## 2023-05-04 ENCOUNTER — Encounter: Payer: Self-pay | Admitting: Student in an Organized Health Care Education/Training Program

## 2023-05-04 ENCOUNTER — Ambulatory Visit
Payer: Medicare Other | Attending: Student in an Organized Health Care Education/Training Program | Admitting: Student in an Organized Health Care Education/Training Program

## 2023-05-04 VITALS — BP 132/85 | HR 102 | Temp 97.2°F | Resp 16 | Ht 64.0 in | Wt 140.0 lb

## 2023-05-04 DIAGNOSIS — M75102 Unspecified rotator cuff tear or rupture of left shoulder, not specified as traumatic: Secondary | ICD-10-CM

## 2023-05-04 DIAGNOSIS — X58XXXS Exposure to other specified factors, sequela: Secondary | ICD-10-CM | POA: Diagnosis not present

## 2023-05-04 DIAGNOSIS — G8929 Other chronic pain: Secondary | ICD-10-CM | POA: Insufficient documentation

## 2023-05-04 DIAGNOSIS — Z9889 Other specified postprocedural states: Secondary | ICD-10-CM | POA: Diagnosis present

## 2023-05-04 DIAGNOSIS — S4432XS Injury of axillary nerve, left arm, sequela: Secondary | ICD-10-CM | POA: Diagnosis present

## 2023-05-04 DIAGNOSIS — M25512 Pain in left shoulder: Secondary | ICD-10-CM | POA: Diagnosis present

## 2023-05-04 DIAGNOSIS — M12812 Other specific arthropathies, not elsewhere classified, left shoulder: Secondary | ICD-10-CM | POA: Diagnosis present

## 2023-05-04 NOTE — Patient Instructions (Signed)
______________________________________________________________________    General Risks and Possible Complications  Patient Responsibilities: It is important that you read this as it is part of your informed consent. It is our duty to inform you of the risks and possible complications associated with treatments offered to you. It is your responsibility as a patient to read this and to ask questions about anything that is not clear or that you believe was not covered in this document.  Patient's Rights: You have the right to refuse treatment. You also have the right to change your mind, even after initially having agreed to have the treatment done. However, under this last option, if you wait until the last second to change your mind, you may be charged for the materials used up to that point.  Introduction: Medicine is not an Visual merchandiser. Everything in Medicine, including the lack of treatment(s), carries the potential for danger, harm, or loss (which is by definition: Risk). In Medicine, a complication is a secondary problem, condition, or disease that can aggravate an already existing one. All treatments carry the risk of possible complications. The fact that a side effects or complications occurs, does not imply that the treatment was conducted incorrectly. It must be clearly understood that these can happen even when everything is done following the highest safety standards.  No treatment: You can choose not to proceed with the proposed treatment alternative. The "PRO(s)" would include: avoiding the risk of complications associated with the therapy. The "CON(s)" would include: not getting any of the treatment benefits. These benefits fall under one of three categories: diagnostic; therapeutic; and/or palliative. Diagnostic benefits include: getting information which can ultimately lead to improvement of the disease or symptom(s). Therapeutic benefits are those associated with the successful  treatment of the disease. Finally, palliative benefits are those related to the decrease of the primary symptoms, without necessarily curing the condition (example: decreasing the pain from a flare-up of a chronic condition, such as incurable terminal cancer).  General Risks and Complications: These are associated to most interventional treatments. They can occur alone, or in combination. They fall under one of the following six (6) categories: no benefit or worsening of symptoms; bleeding; infection; nerve damage; allergic reactions; and/or death. No benefits or worsening of symptoms: In Medicine there are no guarantees, only probabilities. No healthcare provider can ever guarantee that a medical treatment will work, they can only state the probability that it may. Furthermore, there is always the possibility that the condition may worsen, either directly, or indirectly, as a consequence of the treatment. Bleeding: This is more common if the patient is taking a blood thinner, either prescription or over the counter (example: Goody Powders, Fish oil, Aspirin, Garlic, etc.), or if suffering a condition associated with impaired coagulation (example: Hemophilia, cirrhosis of the liver, low platelet counts, etc.). However, even if you do not have one on these, it can still happen. If you have any of these conditions, or take one of these drugs, make sure to notify your treating physician. Infection: This is more common in patients with a compromised immune system, either due to disease (example: diabetes, cancer, human immunodeficiency virus [HIV], etc.), or due to medications or treatments (example: therapies used to treat cancer and rheumatological diseases). However, even if you do not have one on these, it can still happen. If you have any of these conditions, or take one of these drugs, make sure to notify your treating physician. Nerve Damage: This is more common when the treatment is  an invasive one, but it  can also happen with the use of medications, such as those used in the treatment of cancer. The damage can occur to small secondary nerves, or to large primary ones, such as those in the spinal cord and brain. This damage may be temporary or permanent and it may lead to impairments that can range from temporary numbness to permanent paralysis and/or brain death. Allergic Reactions: Any time a substance or material comes in contact with our body, there is the possibility of an allergic reaction. These can range from a mild skin rash (contact dermatitis) to a severe systemic reaction (anaphylactic reaction), which can result in death. Death: In general, any medical intervention can result in death, most of the time due to an unforeseen complication. ______________________________________________________________________      ______________________________________________________________________    Preparing for your procedure  Appointments: If you think you may not be able to keep your appointment, call 24-48 hours in advance to cancel. We need time to make it available to others.  During your procedure appointment there will be: No Prescription Refills. No disability issues to discussed. No medication changes or discussions.  Instructions: Food intake: Avoid eating anything solid for at least 8 hours prior to your procedure. Clear liquid intake: You may take clear liquids such as water up to 2 hours prior to your procedure. (No carbonated drinks. No soda.) Transportation: Unless otherwise stated by your physician, bring a driver. (Driver cannot be a Market researcher, Pharmacist, community, or any other form of public transportation.) Morning Medicines: Except for blood thinners, take all of your other morning medications with a sip of water. Make sure to take your heart and blood pressure medicines. If your blood pressure's lower number is above 100, the case will be rescheduled. Blood thinners: Make sure to stop your blood  thinners as instructed.  If you take a blood thinner, but were not instructed to stop it, call our office [phone] and ask to talk to a nurse. Not stopping a blood thinner prior to certain procedures could lead to serious complications. Diabetics on insulin: Notify the staff so that you can be scheduled 1st case in the morning. If your diabetes requires high dose insulin, take only  of your normal insulin dose the morning of the procedure and notify the staff that you have done so. Preventing infections: Shower with an antibacterial soap the morning of your procedure.  Build-up your immune system: Take 1000 mg of Vitamin C with every meal (3 times a day) the day prior to your procedure. Antibiotics: Inform the nursing staff if you are taking any antibiotics or if you have any conditions that may require antibiotics prior to procedures. (Example: recent joint implants)   Pregnancy: If you are pregnant make sure to notify the nursing staff. Not doing so may result in injury to the fetus, including death.  Sickness: If you have a cold, fever, or any active infections, call and cancel or reschedule your procedure. Receiving steroids while having an infection may result in complications. Arrival: You must be in the facility at least 30 minutes prior to your scheduled procedure. Tardiness: Your scheduled time is also the cutoff time. If you do not arrive at least 15 minutes prior to your procedure, you will be rescheduled.  Children: Do not bring any children with you. Make arrangements to keep them home. Dress appropriately: There is always a possibility that your clothing may get soiled. Avoid long dresses. Valuables: Do not bring any jewelry  or valuables.  Reasons to call and reschedule or cancel your procedure: (Following these recommendations will minimize the risk of a serious complication.) Surgeries: Avoid having procedures within 2 weeks of any surgery. (Avoid for 2 weeks before or after any  surgery). Flu Shots: Avoid having procedures within 2 weeks of a flu shots or . (Avoid for 2 weeks before or after immunizations). Barium: Avoid having a procedure within 7-10 days after having had a radiological study involving the use of radiological contrast. (Myelograms, Barium swallow or enema study). Heart attacks: Avoid any elective procedures or surgeries for the initial 6 months after a "Myocardial Infarction" (Heart Attack). Blood thinners: It is imperative that you stop these medications before procedures. Let us know if you if you take any blood thinner.  Infection: Avoid procedures during or within two weeks of an infection (including chest colds or gastrointestinal problems). Symptoms associated with infections include: Localized redness, fever, chills, night sweats or profuse sweating, burning sensation when voiding, cough, congestion, stuffiness, runny nose, sore throat, diarrhea, nausea, vomiting, cold or Flu symptoms, recent or current infections. It is specially important if the infection is over the area that we intend to treat. Heart and lung problems: Symptoms that may suggest an active cardiopulmonary problem include: cough, chest pain, breathing difficulties or shortness of breath, dizziness, ankle swelling, uncontrolled high or unusually low blood pressure, and/or palpitations. If you are experiencing any of these symptoms, cancel your procedure and contact your primary care physician for an evaluation.  Remember:  Regular Business hours are:  Monday to Thursday 8:00 AM to 4:00 PM  Provider's Schedule: Delano Metz, MD:  Procedure days: Tuesday and Thursday 7:30 AM to 4:00 PM  Edward Jolly, MD:  Procedure days: Monday and Wednesday 7:30 AM to 4:00 PM Last  Updated: 02/10/2023 ______________________________________________________________________

## 2023-05-04 NOTE — Progress Notes (Signed)
PROVIDER NOTE: Information contained herein reflects review and annotations entered in association with encounter. Interpretation of such information and data should be left to medically-trained personnel. Information provided to patient can be located elsewhere in the medical record under "Patient Instructions". Document created using STT-dictation technology, any transcriptional errors that may result from process are unintentional.    Patient: Melissa Garrison  Service Category: E/M  Provider: Edward Jolly, MD  DOB: 1961-06-06  DOS: 05/04/2023  Referring Provider: Allegra Grana, FNP  MRN: [id number]  Specialty: Interventional Pain Management  PCP: Allegra Grana, FNP  Type: Established Patient  Setting: Ambulatory outpatient    Location: Office  Delivery: Face-to-face     HPI  Melissa Garrison, a 62 y.o. year old female, is here today because of her Injury of left axillary nerve, sequela [S44.32XS]. Melissa Garrison's primary complain today is Neck Pain (Left )  Pain Assessment: Severity of Chronic pain is reported as a 3 /10. Location: Neck Left/denies. Onset: More than a month ago. Quality: Discomfort, Sore, Aching. Timing: Intermittent. Modifying factor(s): procedure, medications. Vitals:  height is 5\' 4"  (1.626 m) and weight is 140 lb (63.5 kg). Her temporal temperature is 97.2 F (36.2 C) (abnormal). Her blood pressure is 132/85 and her pulse is 102 (abnormal). Her respiration is 16 and oxygen saturation is 100%.  BMI: Estimated body mass index is 24.03 kg/m as calculated from the following:   Height as of this encounter: 5\' 4"  (1.626 m).   Weight as of this encounter: 140 lb (63.5 kg). Last encounter: 03/11/2023. Last procedure: 03/30/2023.  Reason for encounter: post-procedure evaluation and assessment.    Post-procedure evaluation   Procedure: Axillary nerve block  #1  Laterality:  Left  Level: Approximately 6 cm inferior to the left acromion Imaging: Fluoroscopic guidance          Anesthesia: Local anesthesia (1-2% Lidocaine) Sedation: Minimal Sedation                       DOS: 03/30/2023  Performed by: Edward Jolly, MD  Purpose: Diagnostic/Therapeutic Indications: Shoulder pain, severe enough to impact quality of life and/or function. 1. Injury of left axillary nerve, sequela   2. Cervical spondylosis   3. Cervical facet joint syndrome   4. Cervicalgia    NAS-11 score:   Pre-procedure: 8 /10   Post-procedure: 0-No pain/10      Effectiveness:  Initial hour after procedure: 100 %  Subsequent 4-6 hours post-procedure: 100 %  Analgesia past initial 6 hours: 100 %  Ongoing improvement:  Analgesic:  75% Function: Melissa Garrison reports improvement in function ROM: Melissa Garrison reports improvement in ROM    ROS  Constitutional: Denies any fever or chills Gastrointestinal: No reported hemesis, hematochezia, vomiting, or acute GI distress Musculoskeletal:  Left shoulder pain Neurological: No reported episodes of acute onset apraxia, aphasia, dysarthria, agnosia, amnesia, paralysis, loss of coordination, or loss of consciousness  Medication Review  Blood Glucose Monitoring Suppl, OneTouch Delica Lancets 33G, Semaglutide(0.25 or 0.5MG /DOS), amLODipine, cyclobenzaprine, freestyle, lisinopril, metFORMIN, rosuvastatin, and traMADol  History Review  Allergy: Melissa Garrison has No Known Allergies. Drug: Melissa Garrison  reports that she does not currently use drugs. Alcohol:  reports that she does not currently use alcohol after a past usage of about 3.0 standard drinks of alcohol per week. Tobacco:  reports that she has been smoking cigarettes. She has a 5 pack-year smoking history. She has never used smokeless tobacco. Social: Melissa Garrison  reports that  she has been smoking cigarettes. She has a 5 pack-year smoking history. She has never used smokeless tobacco. She reports that she does not currently use alcohol after a past usage of about 3.0 standard drinks of  alcohol per week. She reports that she does not currently use drugs. Medical:  has a past medical history of Allergic rhinitis (07/11/2011), Diabetes mellitus without complication (HCC), Hepatitis C, Hepatitis C, chronic (HCC) (07/11/2011), Hyperlipidemia, Hypertension (07/11/2011), IV drug abuse (HCC), Vitamin D deficiency, and Wears dentures. Surgical: Melissa Garrison  has a past surgical history that includes Laparoscopic hysterectomy; Cholecystectomy (2011); Humerus surgery (Left); Hydradenitis excision (Right, 03/20/2017); Breast biopsy (Left, 12/05/2015); Colonoscopy (2017); Shoulder arthroscopy with subacromial decompression and open rotator cuff repair, open bicep tendon repair (Left, 01/04/2021); Capsular release (Left, 07/02/2021); Lysis of adhesion (Left, 07/02/2021); Closed manipulation shoulder with steroid injection (Left, 07/02/2021); Shoulder arthroscopy (Left, 07/02/2021); Colonoscopy with propofol (N/A, 05/20/2022); and Closed manipulation shoulder with steroid injection (Left, 08/11/2022). Family: family history includes Diabetes in her father; Hypertension in her brother and mother.  Laboratory Chemistry Profile   Renal Lab Results  Component Value Date   BUN 12 02/18/2023   CREATININE 0.65 02/18/2023   GFR 95.80 02/18/2023   GFRAA >60 03/16/2017   GFRNONAA >60 07/14/2022    Hepatic Lab Results  Component Value Date   AST 15 02/18/2023   ALT 13 02/18/2023   ALBUMIN 4.0 02/18/2023   ALKPHOS 50 02/18/2023    Electrolytes Lab Results  Component Value Date   NA 136 02/18/2023   K 4.2 02/18/2023   CL 101 02/18/2023   CALCIUM 10.3 02/18/2023    Bone Lab Results  Component Value Date   VD25OH 13.67 (L) 10/18/2021    Inflammation (CRP: Acute Phase) (ESR: Chronic Phase) No results found for: "CRP", "ESRSEDRATE", "LATICACIDVEN"       Note: Above Lab results reviewed.   Physical Exam  General appearance: Well nourished, well developed, and well hydrated. In no apparent  acute distress Mental status: Alert, oriented x 3 (person, place, & time)       Respiratory: No evidence of acute respiratory distress Eyes: PERLA Vitals: BP 132/85 (BP Location: Right Arm, Patient Position: Sitting, Cuff Size: Normal)   Pulse (!) 102   Temp (!) 97.2 F (36.2 C) (Temporal)   Resp 16   Ht 5\' 4"  (1.626 m)   Wt 140 lb (63.5 kg)   SpO2 100%   BMI 24.03 kg/m  BMI: Estimated body mass index is 24.03 kg/m as calculated from the following:   Height as of this encounter: 5\' 4"  (1.626 m).   Weight as of this encounter: 140 lb (63.5 kg). Ideal: Ideal body weight: 54.7 kg (120 lb 9.5 oz) Adjusted ideal body weight: 58.2 kg (128 lb 5.7 oz)  Improvement in left shoulder pain and range of motion after left axillary nerve block.  Assessment   Diagnosis Status  1. Injury of left axillary nerve, sequela   2. Chronic left shoulder pain   3. Hx of shoulder surgery   4. Left rotator cuff tear arthropathy    Controlled Responding Controlled     Plan of Care  Positive response to left diagnostic axillary nerve block.  Patient endorses significant analgesic and functional benefit as detailed above.  She states that some of her pain is starting to return and would like to repeat the nerve block prior to the holidays.  Future considerations include radiofrequency ablation versus Sprint peripheral nerve stimulation of the left axillary nerve.  I discussed Sprint PNS with her in detail and she states that she would like to hold off at this time.  This is reasonable.  Orders:  Orders Placed This Encounter  Procedures   AXILLARY NERVE BLOCK    Standing Status:   Future    Standing Expiration Date:   08/04/2023    Scheduling Instructions:     PROCEDURE: Axillary Nerve Block (CPT [phone])     LATERALITY: LEFT     SEDATION: Patient's choice.     TIMEFRAME: ASAA    Order Specific Question:   Where will this procedure be performed?    Answer:   [facility]   Follow-up plan:    Return in about 16 days (around 05/20/2023) for left axillary NB #2, in clinic (PO Valium 5mg ).      Left SSNB 01/19/23, Left C3,4 MBNB, Left axillary nerve block      Recent Visits Date Type Provider Dept  03/30/23 Procedure visit Edward Jolly, MD [facility]  03/11/23 Office Visit Edward Jolly, MD [facility]  02/09/23 Office Visit Edward Jolly, MD [facility]  Showing recent visits within past 90 days and meeting all other requirements Today's Visits Date Type Provider Dept  05/04/23 Office Visit Edward Jolly, MD [facility]  Showing today's visits and meeting all other requirements Future Appointments Date Type Provider Dept  05/20/23 Appointment Edward Jolly, MD [facility]  Showing future appointments within next 90 days and meeting all other requirements  I discussed the assessment and treatment plan with the patient. The patient was provided an opportunity to ask questions and all were answered. The patient agreed with the plan and demonstrated an understanding of the instructions.  Patient advised to call back or seek an in-person evaluation if the symptoms or condition worsens.  Duration of encounter: 15 minutes.  Total time on encounter, as per AMA guidelines included both the face-to-face and non-face-to-face time personally spent by the physician and/or other qualified health care professional(s) on the day of the encounter (includes time in activities that require the physician or other qualified health care professional and does not include time in activities normally performed by clinical staff). Physician's time may include the following activities when performed: Preparing to see the patient (e.g., pre-charting review of records, searching for previously ordered imaging, lab work, and nerve conduction tests) Review of prior analgesic pharmacotherapies. Reviewing PMP Interpreting ordered tests (e.g., lab work,  imaging, nerve conduction tests) Performing post-procedure evaluations, including interpretation of diagnostic procedures Obtaining and/or reviewing separately obtained history Performing a medically appropriate examination and/or evaluation Counseling and educating the patient/family/caregiver Ordering medications, tests, or procedures Referring and communicating with other health care professionals (when not separately reported) Documenting clinical information in the electronic or other health record Independently interpreting results (not separately reported) and communicating results to the patient/ family/caregiver Care coordination (not separately reported)  Note by: Edward Jolly, MD Date: 05/04/2023; Time: 3:07 PM

## 2023-05-19 ENCOUNTER — Other Ambulatory Visit (INDEPENDENT_AMBULATORY_CARE_PROVIDER_SITE_OTHER): Payer: Medicaid Other

## 2023-05-19 DIAGNOSIS — I10 Essential (primary) hypertension: Secondary | ICD-10-CM | POA: Diagnosis not present

## 2023-05-20 ENCOUNTER — Ambulatory Visit
Payer: Medicare Other | Attending: Student in an Organized Health Care Education/Training Program | Admitting: Student in an Organized Health Care Education/Training Program

## 2023-05-20 ENCOUNTER — Encounter: Payer: Self-pay | Admitting: Student in an Organized Health Care Education/Training Program

## 2023-05-20 VITALS — BP 162/83 | HR 84 | Temp 97.2°F | Resp 17 | Ht 64.0 in | Wt 140.0 lb

## 2023-05-20 DIAGNOSIS — X58XXXS Exposure to other specified factors, sequela: Secondary | ICD-10-CM | POA: Diagnosis not present

## 2023-05-20 DIAGNOSIS — M25512 Pain in left shoulder: Secondary | ICD-10-CM | POA: Diagnosis present

## 2023-05-20 DIAGNOSIS — S4982XS Other specified injuries of left shoulder and upper arm, sequela: Secondary | ICD-10-CM | POA: Insufficient documentation

## 2023-05-20 DIAGNOSIS — S4432XS Injury of axillary nerve, left arm, sequela: Secondary | ICD-10-CM | POA: Diagnosis present

## 2023-05-20 DIAGNOSIS — M75102 Unspecified rotator cuff tear or rupture of left shoulder, not specified as traumatic: Secondary | ICD-10-CM | POA: Diagnosis not present

## 2023-05-20 DIAGNOSIS — M12812 Other specific arthropathies, not elsewhere classified, left shoulder: Secondary | ICD-10-CM | POA: Diagnosis present

## 2023-05-20 DIAGNOSIS — Z9889 Other specified postprocedural states: Secondary | ICD-10-CM | POA: Insufficient documentation

## 2023-05-20 DIAGNOSIS — G8929 Other chronic pain: Secondary | ICD-10-CM | POA: Insufficient documentation

## 2023-05-20 LAB — CBC WITH DIFFERENTIAL/PLATELET
Basophils Absolute: 0.1 10*3/uL (ref 0.0–0.1)
Basophils Relative: 0.9 % (ref 0.0–3.0)
Eosinophils Absolute: 0.1 10*3/uL (ref 0.0–0.7)
Eosinophils Relative: 1.1 % (ref 0.0–5.0)
HCT: 34.7 % — ABNORMAL LOW (ref 36.0–46.0)
Hemoglobin: 11.7 g/dL — ABNORMAL LOW (ref 12.0–15.0)
Lymphocytes Relative: 53.4 % — ABNORMAL HIGH (ref 12.0–46.0)
Lymphs Abs: 4.6 10*3/uL — ABNORMAL HIGH (ref 0.7–4.0)
MCHC: 33.8 g/dL (ref 30.0–36.0)
MCV: 83.7 fL (ref 78.0–100.0)
Monocytes Absolute: 0.6 10*3/uL (ref 0.1–1.0)
Monocytes Relative: 6.8 % (ref 3.0–12.0)
Neutro Abs: 3.3 10*3/uL (ref 1.4–7.7)
Neutrophils Relative %: 37.8 % — ABNORMAL LOW (ref 43.0–77.0)
Platelets: 358 10*3/uL (ref 150.0–400.0)
RBC: 4.14 Mil/uL (ref 3.87–5.11)
RDW: 14.5 % (ref 11.5–15.5)
WBC: 8.6 10*3/uL (ref 4.0–10.5)

## 2023-05-20 MED ORDER — LIDOCAINE HCL 2 % IJ SOLN
20.0000 mL | Freq: Once | INTRAMUSCULAR | Status: AC
  Administered 2023-05-20: 100 mg

## 2023-05-20 MED ORDER — LIDOCAINE HCL (PF) 2 % IJ SOLN
INTRAMUSCULAR | Status: AC
  Filled 2023-05-20: qty 10

## 2023-05-20 MED ORDER — DIAZEPAM 5 MG PO TABS
5.0000 mg | ORAL_TABLET | ORAL | Status: AC
  Administered 2023-05-20: 5 mg via ORAL

## 2023-05-20 MED ORDER — DEXAMETHASONE SODIUM PHOSPHATE 10 MG/ML IJ SOLN
10.0000 mg | Freq: Once | INTRAMUSCULAR | Status: AC
  Administered 2023-05-20: 10 mg

## 2023-05-20 MED ORDER — ROPIVACAINE HCL 2 MG/ML IJ SOLN
4.0000 mL | Freq: Once | INTRAMUSCULAR | Status: AC
  Administered 2023-05-20: 4 mL via PERINEURAL

## 2023-05-20 MED ORDER — DIAZEPAM 5 MG PO TABS
ORAL_TABLET | ORAL | Status: AC
  Filled 2023-05-20: qty 1

## 2023-05-20 MED ORDER — DEXAMETHASONE SODIUM PHOSPHATE 10 MG/ML IJ SOLN
INTRAMUSCULAR | Status: AC
  Filled 2023-05-20: qty 1

## 2023-05-20 NOTE — Progress Notes (Signed)
PROVIDER NOTE: Interpretation of information contained herein should be left to medically-trained personnel. Specific patient instructions are provided elsewhere under "Patient Instructions" section of medical record. This document was created in part using STT-dictation technology, any transcriptional errors that may result from this process are unintentional.  Patient: Melissa Garrison Type: Established DOB: [birth year] MRN: [id number] PCP: Allegra Grana, FNP  Service: Procedure DOS: 05/20/2023 Setting: Ambulatory Location: Ambulatory outpatient facility Delivery: Face-to-face Provider: Edward Jolly, MD Specialty: Interventional Pain Management Specialty designation: 09 Location: Outpatient facility Ref. Prov.: Allegra Grana, FNP       Interventional Therapy   Procedure: Axillary nerve block  #1  Laterality:  Left  Level: Approximately 6 cm inferior to the left acromion Imaging: Ultrasound-guided         Anesthesia: Local anesthesia (1-2% Lidocaine) Sedation: Valium 5 mg PO DOS: 05/20/2023  Performed by: Edward Jolly, MD  Purpose: Diagnostic/Therapeutic Indications: Shoulder pain, severe enough to impact quality of life and/or function. 1. Injury of left axillary nerve, sequela   2. Chronic left shoulder pain   3. Hx of shoulder surgery   4. Left rotator cuff tear arthropathy     NAS-11 score:   Pre-procedure: 7 /10   Post-procedure: 0-No pain/10     Target: Axillary nerve, terminal branches Location: Inferior to acromion Region: left deltoid Approach: Percutaneous  Neuroanatomy: Target region is a middle of the deltoid muscle inferior to acromion   Position / Prep / Materials:  Position: Prone Materials:  Tray: Block Needle(s):  Type: Spinal  Gauge (G): 22  Length: 3.5 in.  Qty: 1 Prep solution: DuraPrep (Iodine Povacrylex [0.7% available iodine] and Isopropyl Alcohol, 74% w/w) Prep Area: Entire posterior shoulder area. From upper spine to shoulder  proper (upper arm), and from lateral neck to lower tip of shoulder blade.   H&P (Pre-op Assessment):  Melissa Garrison is a 62 y.o. (year old), female patient, seen today for interventional treatment. She  has a past surgical history that includes Laparoscopic hysterectomy; Cholecystectomy (2011); Humerus surgery (Left); Hydradenitis excision (Right, 03/20/2017); Breast biopsy (Left, 12/05/2015); Colonoscopy (2017); Shoulder arthroscopy with subacromial decompression and open rotator cuff repair, open bicep tendon repair (Left, 01/04/2021); Capsular release (Left, 07/02/2021); Lysis of adhesion (Left, 07/02/2021); Closed manipulation shoulder with steroid injection (Left, 07/02/2021); Shoulder arthroscopy (Left, 07/02/2021); Colonoscopy with propofol (N/A, 05/20/2022); and Closed manipulation shoulder with steroid injection (Left, 08/11/2022). Melissa Garrison has a current medication list which includes the following prescription(s): amlodipine, blood glucose monitoring suppl, cyclobenzaprine, freestyle, lisinopril, metformin, onetouch delica lancets 33g, rosuvastatin, semaglutide(0.25 or 0.5mg /dos), and tramadol. Her primarily concern today is the Shoulder Pain (left)  Initial Vital Signs:  Pulse/HCG Rate: 84ECG Heart Rate: 84 Temp: (!) 97.2 F (36.2 C) Resp: 18 BP: 124/71 SpO2: 100 %  BMI: Estimated body mass index is 24.03 kg/m as calculated from the following:   Height as of this encounter: 5\' 4"  (1.626 m).   Weight as of this encounter: 140 lb (63.5 kg).  Risk Assessment: Allergies: Reviewed. She has No Known Allergies.  Allergy Precautions: None required Coagulopathies: Reviewed. None identified.  Blood-thinner therapy: None at this time Active Infection(s): Reviewed. None identified. Melissa Garrison is afebrile  Site Confirmation: Melissa Garrison was asked to confirm the procedure and laterality before marking the site Procedure checklist: Completed Consent: Before the procedure and under the  influence of no sedative(s), amnesic(s), or anxiolytics, the patient was informed of the treatment options, risks and possible complications. To fulfill our ethical and legal obligations, as recommended  by the American Medical Association's Code of Ethics, I have informed the patient of my clinical impression; the nature and purpose of the treatment or procedure; the risks, benefits, and possible complications of the intervention; the alternatives, including doing nothing; the risk(s) and benefit(s) of the alternative treatment(s) or procedure(s); and the risk(s) and benefit(s) of doing nothing. The patient was provided information about the general risks and possible complications associated with the procedure. These may include, but are not limited to: failure to achieve desired goals, infection, bleeding, organ or nerve damage, allergic reactions, paralysis, and death. In addition, the patient was informed of those risks and complications associated to the procedure, such as failure to decrease pain; infection; bleeding; organ or nerve damage with subsequent damage to sensory, motor, and/or autonomic systems, resulting in permanent pain, numbness, and/or weakness of one or several areas of the body; allergic reactions; (i.e.: anaphylactic reaction); and/or death. Furthermore, the patient was informed of those risks and complications associated with the medications. These include, but are not limited to: allergic reactions (i.e.: anaphylactic or anaphylactoid reaction(s)); adrenal axis suppression; blood sugar elevation that in diabetics may result in ketoacidosis or comma; water retention that in patients with history of congestive heart failure may result in shortness of breath, pulmonary edema, and decompensation with resultant heart failure; weight gain; swelling or edema; medication-induced neural toxicity; particulate matter embolism and blood vessel occlusion with resultant organ, and/or nervous system  infarction; and/or aseptic necrosis of one or more joints. Finally, the patient was informed that Medicine is not an exact science; therefore, there is also the possibility of unforeseen or unpredictable risks and/or possible complications that may result in a catastrophic outcome. The patient indicated having understood very clearly. We have given the patient no guarantees and we have made no promises. Enough time was given to the patient to ask questions, all of which were answered to the patient's satisfaction. Melissa Garrison has indicated that she wanted to continue with the procedure. Attestation: I, the ordering provider, attest that I have discussed with the patient the benefits, risks, side-effects, alternatives, likelihood of achieving goals, and potential problems during recovery for the procedure that I have provided informed consent. Date  Time: 05/20/2023 10:26 AM  Pre-Procedure Preparation:  Monitoring: As per clinic protocol. Respiration, ETCO2, SpO2, BP, heart rate and rhythm monitor placed and checked for adequate function Safety Precautions: Patient was assessed for positional comfort and pressure points before starting the procedure. Time-out: I initiated and conducted the "Time-out" before starting the procedure, as per protocol. The patient was asked to participate by confirming the accuracy of the "Time Out" information. Verification of the correct person, site, and procedure were performed and confirmed by me, the nursing staff, and the patient. "Time-out" conducted as per Joint Commission's Universal Protocol (UP.01.01.01). Time: 1046 Start Time: 1047 hrs.  Description of Procedure:            The acromion was palpated on the left, 6 cm below, injection was performed.  The humerus was contacted with spinal needle and retracted 1 to 2 cm.  Patient felt paresthesias over her left deltoid region in the area of her axillary nerve. 6 cc solution made of 5 cc of 0.2% ropivacaine, 1 cc  of Decadron 10 mg/cc was injected.  Vitals:   05/20/23 1031 05/20/23 1045 05/20/23 1051  BP: 124/71  (!) 162/83  Pulse: 84    Resp:  18 17  Temp: (!) 97.2 F (36.2 C)    SpO2: 100% 100%  100%  Weight: 140 lb (63.5 kg)    Height: 5\' 4"  (1.626 m)       Start Time: 1047 hrs. End Time: 1051 hrs.  Imaging Guidance (Spinal):          Type of Imaging Technique: US guidance  Indication(s): Assistance in needle guidance and placement for procedures requiring needle placement in or near specific anatomical locations not easily accessible without such assistance.   Antibiotic Prophylaxis:   Anti-infectives (From admission, onward)    None      Indication(s): None identified  Post-operative Assessment:  Post-procedure Vital Signs:  Pulse/HCG Rate: 8491 Temp: (!) 97.2 F (36.2 C) Resp: 17 BP: (!) 162/83 SpO2: 100 %  EBL: None  Complications: No immediate post-treatment complications observed by team, or reported by patient.  Note: The patient tolerated the entire procedure well. A repeat set of vitals were taken after the procedure and the patient was kept under observation following institutional policy, for this type of procedure. Post-procedural neurological assessment was performed, showing return to baseline, prior to discharge. The patient was provided with post-procedure discharge instructions, including a section on how to identify potential problems. Should any problems arise concerning this procedure, the patient was given instructions to immediately contact us, at any time, without hesitation. In any case, we plan to contact the patient by telephone for a follow-up status report regarding this interventional procedure.  Comments:  No additional relevant information.  Plan of Care (POC)  Orders:  No orders of the defined types were placed in this encounter.    Medications ordered for procedure: Meds ordered this encounter  Medications   diazepam (VALIUM) tablet 5  mg    Make sure Flumazenil is available in the pyxis when using this medication. If oversedation occurs, administer 0.2 mg IV over 15 sec. If after 45 sec no response, administer 0.2 mg again over 1 min; may repeat at 1 min intervals; not to exceed 4 doses (1 mg)   dexamethasone (DECADRON) injection 10 mg   ropivacaine (PF) 2 mg/mL (0.2%) (NAROPIN) injection 4 mL   lidocaine (XYLOCAINE) 2 % (with pres) injection 400 mg   Medications administered: We administered diazepam, dexamethasone, ropivacaine (PF) 2 mg/mL (0.2%), and lidocaine.  See the medical record for exact dosing, route, and time of administration.  Follow-up plan:   Return in about 4 weeks (around 06/17/2023), or ppe in person.      Recent Visits Date Type Provider Dept  05/04/23 Office Visit Edward Jolly, MD [facility]  03/30/23 Procedure visit Edward Jolly, MD [facility]  03/11/23 Office Visit Edward Jolly, MD [facility]  Showing recent visits within past 90 days and meeting all other requirements Today's Visits Date Type Provider Dept  05/20/23 Procedure visit Edward Jolly, MD [facility]  Showing today's visits and meeting all other requirements Future Appointments Date Type Provider Dept  07/01/23 Appointment Edward Jolly, MD [facility]  Showing future appointments within next 90 days and meeting all other requirements  Disposition: Discharge home  Discharge (Date  Time): 05/20/2023; 1100 hrs.   Primary Care Physician: Allegra Grana, FNP Location: [facility] Note by: Edward Jolly, MD (TTS technology used. I apologize for any typographical errors that were not detected and corrected.) Date: 05/20/2023; Time: 11:11 AM  Disclaimer:  Medicine is not an Visual merchandiser. The only guarantee in medicine is that nothing is guaranteed. It is important to note that the decision to proceed with  this intervention was based on the  information collected from the patient. The Data and conclusions were drawn from the patient's questionnaire, the interview, and the physical examination. Because the information was provided in large part by the patient, it cannot be guaranteed that it has not been purposely or unconsciously manipulated. Every effort has been made to obtain as much relevant data as possible for this evaluation. It is important to note that the conclusions that lead to this procedure are derived in large part from the available data. Always take into account that the treatment will also be dependent on availability of resources and existing treatment guidelines, considered by other Pain Management Practitioners as being common knowledge and practice, at the time of the intervention. For Medico-Legal purposes, it is also important to point out that variation in procedural techniques and pharmacological choices are the acceptable norm. The indications, contraindications, technique, and results of the above procedure should only be interpreted and judged by a Board-Certified Interventional Pain Specialist with extensive familiarity and expertise in the same exact procedure and technique.

## 2023-05-20 NOTE — Patient Instructions (Signed)

## 2023-05-20 NOTE — Progress Notes (Signed)
Safety precautions to be maintained throughout the outpatient stay will include: orient to surroundings, keep bed in low position, maintain call bell within reach at all times, provide assistance with transfer out of bed and ambulation.  

## 2023-05-22 ENCOUNTER — Telehealth: Payer: Self-pay

## 2023-05-22 NOTE — Telephone Encounter (Signed)
Post procedure follow up.  Patient states she is doing well.   ?

## 2023-05-25 ENCOUNTER — Ambulatory Visit (INDEPENDENT_AMBULATORY_CARE_PROVIDER_SITE_OTHER): Payer: Medicaid Other | Admitting: Family

## 2023-05-25 ENCOUNTER — Encounter: Payer: Self-pay | Admitting: Family

## 2023-05-25 VITALS — BP 120/70 | HR 88 | Temp 98.3°F | Ht 64.0 in | Wt 143.2 lb

## 2023-05-25 DIAGNOSIS — I10 Essential (primary) hypertension: Secondary | ICD-10-CM | POA: Diagnosis not present

## 2023-05-25 DIAGNOSIS — E119 Type 2 diabetes mellitus without complications: Secondary | ICD-10-CM | POA: Diagnosis not present

## 2023-05-25 DIAGNOSIS — D649 Anemia, unspecified: Secondary | ICD-10-CM | POA: Insufficient documentation

## 2023-05-25 DIAGNOSIS — Z7985 Long-term (current) use of injectable non-insulin antidiabetic drugs: Secondary | ICD-10-CM

## 2023-05-25 LAB — POCT GLYCOSYLATED HEMOGLOBIN (HGB A1C): Hemoglobin A1C: 7.2 % — AB (ref 4.0–5.6)

## 2023-05-25 MED ORDER — METFORMIN HCL ER 500 MG PO TB24: 1000.0000 mg | ORAL_TABLET | Freq: Two times a day (BID) | ORAL | 3 refills | Status: DC

## 2023-05-25 NOTE — Patient Instructions (Addendum)
  Please schedule labs in 2 months to monitor anemia.    Goal of fasting blood sugar is between 70-120. If in this range, we are reaching our target a1c ( goal 6.5%)   Please check fasting blood sugar in the morning time once or twice a week.  You may also check if you feel like you are having a low episode or particularly high episode of blood sugar.  If blood sugars increase, I may advise you to check blood sugar after your largest meal.  You specifically do this TWO hours after largest meal with the goal of being less than 180.  If blood sugar is checked sooner than 2 hours after largest meal, and it will be  expected to be elevated. You must wait 2 hours.   If your blood sugar is less than 180 hours after your largest meal, again we are reaching our target a1c goal   Call [facility] clinic if: BG < 70 or > 300.   If you have any symptoms of low blood sugar ( sweating, shakiness, lightheaded, dizzy) that you notify me. If you have a low, please drink a glass of orange juice and recheck blood sugar every 5 minutes until you don't feel symptomatic AND blood sugar is above 80.

## 2023-05-25 NOTE — Progress Notes (Unsigned)
Assessment & Plan:  Diabetes mellitus without complication [facility]) Assessment & Plan: Lab Results  Component Value Date   HGBA1C 7.2 (A) 05/25/2023   Improved. Continue metformin 1000 mg twice daily,  Ozempic 0.5mg .  We deferred increasing Ozempic as patient prefers to work on lifestyle changes.  Orders: -     POCT glycosylated hemoglobin (Hb A1C) -     metFORMIN HCl ER; Take 2 tablets (1,000 mg total) by mouth 2 (two) times daily with a meal.  Dispense: 360 tablet; Refill: 3  Anemia, unspecified type Assessment & Plan: Colonoscopy 05/20/2022, 1 polyp, internal hemorrhoids.  Repeat in 7 years ferritin 265 UA negative for RBCs Hemoglobin 11.7 ( prior 11.2)  Etiology unknown , normocytic anemia. Hgb improved from prior. Will repeat in 6-8 weeks time.   Orders: -     CBC with Differential/Platelet; Future  Primary hypertension Assessment & Plan: Chronic, stable. Continue amlodipine 5 mg daily, lisinopril 40 mg daily      Return precautions given.   Risks, benefits, and alternatives of the medications and treatment plan prescribed today were discussed, and patient expressed understanding.   Education regarding symptom management and diagnosis given to patient on AVS either electronically or printed.  Return in about 3 months (around 08/23/2023).  Rennie Plowman, FNP  Subjective:    Patient ID: Melissa Garrison, female    DOB: 1961/05/10, 62 y.o.   MRN: [id number]  CC: Melissa Garrison is a 62 y.o. female who presents today for follow up.   HPI: Feels well today No new complaints  She is tolerating ozempic without nausea, constipation.   FBG 98-120.      Colonoscopy 05/20/2022, 1 polyp, internal hemorrhoids.  Repeat in 7 years Ferritin 265 UA negative for RBCs Hemoglobin 11.7 ( prior 11.2) Smoker   No NSAID, alcohol use  No vaginal or rectal bleeding.    Allergies: Patient has no known allergies. Current Outpatient Medications on File Prior to Visit   Medication Sig Dispense Refill   amLODipine (NORVASC) 5 MG tablet Take 1 tablet (5 mg total) by mouth daily. 90 tablet 3   Blood Glucose Monitoring Suppl DEVI 1 each by Does not apply route in the morning, at noon, and at bedtime. May substitute to any manufacturer covered by patient's insurance. 1 each 0   cyclobenzaprine (FLEXERIL) 5 MG tablet Take 1-2 tablets (5-10 mg total) by mouth 3 (three) times daily as needed for muscle spasms. 90 tablet 1   Lancets (FREESTYLE) lancets Use as instructed 100 each 12   lisinopril (ZESTRIL) 40 MG tablet Take 1 tablet (40 mg total) by mouth daily. 90 tablet 3   OneTouch Delica Lancets 33G MISC Used to check blood sugar once daily. 200 each 3   rosuvastatin (CRESTOR) 10 MG tablet Take 1 tablet daily three days per week 48 tablet 2   Semaglutide,0.25 or 0.5MG /DOS, 2 MG/3ML SOPN Inject 0.25 mg into the skin once a week. 3 mL 2   traMADol (ULTRAM) 50 MG tablet Take 1 tablet (50 mg total) by mouth 2 (two) times daily as needed. 60 tablet 2   No current facility-administered medications on file prior to visit.    Review of Systems  Constitutional:  Negative for chills and fever.  Respiratory:  Negative for cough.   Cardiovascular:  Negative for chest pain and palpitations.  Gastrointestinal:  Negative for blood in stool, nausea and vomiting.  Genitourinary:  Negative for hematuria and vaginal bleeding.      Objective:  BP 120/70   Pulse 88   Temp 98.3 F (36.8 C) (Oral)   Ht 5\' 4"  (1.626 m)   Wt 143 lb 3.2 oz (65 kg)   SpO2 99%   BMI 24.58 kg/m  BP Readings from Last 3 Encounters:  05/25/23 120/70  05/20/23 (!) 162/83  05/04/23 132/85   Wt Readings from Last 3 Encounters:  05/25/23 143 lb 3.2 oz (65 kg)  05/20/23 140 lb (63.5 kg)  05/04/23 140 lb (63.5 kg)    Physical Exam Vitals reviewed.  Constitutional:      Appearance: She is well-developed.  Eyes:     Conjunctiva/sclera: Conjunctivae normal.  Cardiovascular:     Rate and  Rhythm: Normal rate and regular rhythm.     Pulses: Normal pulses.     Heart sounds: Normal heart sounds.  Pulmonary:     Effort: Pulmonary effort is normal.     Breath sounds: Normal breath sounds. No wheezing, rhonchi or rales.  Skin:    General: Skin is warm and dry.  Neurological:     Mental Status: She is alert.  Psychiatric:        Speech: Speech normal.        Behavior: Behavior normal.        Thought Content: Thought content normal.

## 2023-05-26 NOTE — Assessment & Plan Note (Signed)
Colonoscopy 05/20/2022, 1 polyp, internal hemorrhoids.  Repeat in 7 years ferritin 265 UA negative for RBCs Hemoglobin 11.7 ( prior 11.2)  Etiology unknown , normocytic anemia. Hgb improved from prior. Will repeat in 6-8 weeks time.

## 2023-05-26 NOTE — Assessment & Plan Note (Signed)
Chronic, stable. Continue amlodipine 5 mg daily, lisinopril 40 mg daily

## 2023-05-26 NOTE — Assessment & Plan Note (Signed)
Lab Results  Component Value Date   HGBA1C 7.2 (A) 05/25/2023   Improved. Continue metformin 1000 mg twice daily,  Ozempic 0.5mg .  We deferred increasing Ozempic as patient prefers to work on lifestyle changes.

## 2023-07-01 ENCOUNTER — Ambulatory Visit
Payer: Medicaid Other | Attending: Student in an Organized Health Care Education/Training Program | Admitting: Student in an Organized Health Care Education/Training Program

## 2023-07-01 VITALS — BP 148/86 | HR 103 | Temp 98.0°F | Resp 16 | Ht 64.0 in | Wt 143.0 lb

## 2023-07-01 DIAGNOSIS — S4432XS Injury of axillary nerve, left arm, sequela: Secondary | ICD-10-CM

## 2023-07-01 DIAGNOSIS — Z9889 Other specified postprocedural states: Secondary | ICD-10-CM

## 2023-07-01 DIAGNOSIS — G8929 Other chronic pain: Secondary | ICD-10-CM

## 2023-07-01 DIAGNOSIS — S4432XA Injury of axillary nerve, left arm, initial encounter: Secondary | ICD-10-CM | POA: Insufficient documentation

## 2023-07-01 DIAGNOSIS — M25512 Pain in left shoulder: Secondary | ICD-10-CM | POA: Diagnosis present

## 2023-07-01 NOTE — Progress Notes (Signed)
 PROVIDER NOTE: Information contained herein reflects review and annotations entered in association with encounter. Interpretation of such information and data should be left to medically-trained personnel. Information provided to patient can be located elsewhere in the medical record under Patient Instructions. Document created using STT-dictation technology, any transcriptional errors that may result from process are unintentional.    Patient: Melissa Garrison  Service Category: E/M  Provider: Wallie Sherry, MD  DOB: [birth year]  DOS: 07/01/2023  Referring Provider: Dineen Rollene MATSU, FNP  MRN: [id number]  Specialty: Interventional Pain Management  PCP: Dineen Rollene MATSU, FNP  Type: Established Patient  Setting: Ambulatory outpatient    Location: Office  Delivery: Face-to-face     HPI  Ms. Melissa Garrison, a 63 y.o. year old female, is here today because of her Injury of left axillary nerve, sequela [S44.32XS]. Melissa Garrison's primary complain today is Shoulder Pain  Pain Assessment: Severity of Chronic pain is reported as a 9 /10. Location: Shoulder Left/down to left elbow. Onset: More than a month ago. Quality: Aching, Dull, Burning, Constant, Radiating, Nagging, Stabbing, Throbbing. Timing: Constant. Modifying factor(s): rest, prjocedures. Vitals:  height is 5' 4 (1.626 m) and weight is 143 lb (64.9 kg). Her temperature is 98 F (36.7 C). Her blood pressure is 148/86 (abnormal) and her pulse is 103 (abnormal). Her respiration is 16 and oxygen saturation is 100%.  BMI: Estimated body mass index is 24.55 kg/m as calculated from the following:   Height as of this encounter: 5' 4 (1.626 m).   Weight as of this encounter: 143 lb (64.9 kg). Last encounter: 05/04/2023. Last procedure: 05/20/2023.  Reason for encounter: post-procedure evaluation and assessment.    Post-procedure evaluation    Procedure: Axillary nerve block  #1  Laterality:  Left  Level: Approximately 6 cm inferior to the left  acromion Imaging: Ultrasound-guided         Anesthesia: Local anesthesia (1-2% Lidocaine ) Sedation: Valium  5 mg PO DOS: 05/20/2023  Performed by: Wallie Sherry, MD  Purpose: Diagnostic/Therapeutic Indications: Shoulder pain, severe enough to impact quality of life and/or function. 1. Injury of left axillary nerve, sequela   2. Chronic left shoulder pain   3. Hx of shoulder surgery   4. Left rotator cuff tear arthropathy     NAS-11 score:   Pre-procedure: 7 /10   Post-procedure: 0-No pain/10     Effectiveness:  Initial hour after procedure: 100 %  Subsequent 4-6 hours post-procedure: 100 %  Analgesia past initial 6 hours: 100 % (3 weeks)  Ongoing improvement:  Analgesic:  75% Function: No improvement ROM: Back to baseline    ROS  Constitutional: Denies any fever or chills Gastrointestinal: No reported hemesis, hematochezia, vomiting, or acute GI distress Musculoskeletal:  left shoulder pain Neurological: No reported episodes of acute onset apraxia, aphasia, dysarthria, agnosia, amnesia, paralysis, loss of coordination, or loss of consciousness  Medication Review  Blood Glucose Monitoring Suppl, OneTouch Delica Lancets 33G, Semaglutide (0.25 or 0.5MG /DOS), amLODipine , cyclobenzaprine , freestyle, lisinopril , metFORMIN , rosuvastatin , and traMADol   History Review  Allergy: Melissa Garrison has no known allergies. Drug: Melissa Garrison  reports that she does not currently use drugs. Alcohol:  reports that she does not currently use alcohol after a past usage of about 3.0 standard drinks of alcohol per week. Tobacco:  reports that she has been smoking cigarettes. She has a 5 pack-year smoking history. She has never used smokeless tobacco. Social: Melissa Garrison  reports that she has been smoking cigarettes. She has a 5 pack-year smoking history. She has  never used smokeless tobacco. She reports that she does not currently use alcohol after a past usage of about 3.0 standard drinks of alcohol  per week. She reports that she does not currently use drugs. Medical:  has a past medical history of Allergic rhinitis (07/11/2011), Diabetes mellitus without complication (HCC), Hepatitis C, Hepatitis C, chronic (HCC) (07/11/2011), Hyperlipidemia, Hypertension (07/11/2011), IV drug abuse (HCC), Vitamin D  deficiency, and Wears dentures. Surgical: Melissa Garrison  has a past surgical history that includes Laparoscopic hysterectomy; Cholecystectomy (2011); Humerus surgery (Left); Hydradenitis excision (Right, 03/20/2017); Breast biopsy (Left, 12/05/2015); Colonoscopy (2017); Shoulder arthroscopy with subacromial decompression and open rotator cuff repair, open bicep tendon repair (Left, 01/04/2021); Capsular release (Left, 07/02/2021); Lysis of adhesion (Left, 07/02/2021); Closed manipulation shoulder with steroid injection (Left, 07/02/2021); Shoulder arthroscopy (Left, 07/02/2021); Colonoscopy with propofol  (N/A, 05/20/2022); and Closed manipulation shoulder with steroid injection (Left, 08/11/2022). Family: family history includes Diabetes in her father; Hypertension in her brother and mother.  Laboratory Chemistry Profile   Renal Lab Results  Component Value Date   BUN 12 02/18/2023   CREATININE 0.65 02/18/2023   GFR 95.80 02/18/2023   GFRAA >60 03/16/2017   GFRNONAA >60 07/14/2022    Hepatic Lab Results  Component Value Date   AST 15 02/18/2023   ALT 13 02/18/2023   ALBUMIN 4.0 02/18/2023   ALKPHOS 50 02/18/2023    Electrolytes Lab Results  Component Value Date   NA 136 02/18/2023   K 4.2 02/18/2023   CL 101 02/18/2023   CALCIUM  10.3 02/18/2023    Bone Lab Results  Component Value Date   VD25OH 13.67 (L) 10/18/2021    Inflammation (CRP: Acute Phase) (ESR: Chronic Phase) No results found for: CRP, ESRSEDRATE, LATICACIDVEN       Note: Above Lab results reviewed.  Recent Imaging Review  DG PAIN CLINIC C-ARM 1-60 MIN NO REPORT Fluoro was used, but no Radiologist  interpretation will be provided.  Please refer to NOTES tab for provider progress note. Note: Reviewed        Physical Exam  General appearance: Well nourished, well developed, and well hydrated. In no apparent acute distress Mental status: Alert, oriented x 3 (person, place, & time)       Respiratory: No evidence of acute respiratory distress Eyes: PERLA Vitals: BP (!) 148/86   Pulse (!) 103   Temp 98 F (36.7 C)   Resp 16   Ht 5' 4 (1.626 m)   Wt 143 lb (64.9 kg)   SpO2 100%   BMI 24.55 kg/m  BMI: Estimated body mass index is 24.55 kg/m as calculated from the following:   Height as of this encounter: 5' 4 (1.626 m).   Weight as of this encounter: 143 lb (64.9 kg). Ideal: Ideal body weight: 54.7 kg (120 lb 9.5 oz) Adjusted ideal body weight: 58.8 kg (129 lb 8.9 oz)  Left shoulder pain, worse with shoulder abduction  Assessment   Diagnosis Status  1. Injury of left axillary nerve, sequela   2. Chronic left shoulder pain   3. Hx of shoulder surgery    Controlled Controlled Controlled   Updated Problems: No problems updated.  Plan of Care  Patient is status post 2 positive diagnostic axillary nerve blocks for left shoulder pain in the context of 3 prior shoulder surgeries. We discussed next steps which could include repeating nerve block, radiofrequency ablation of the axillary nerve, Sprint peripheral nerve stimulation of the axillary nerve. I spent time with the patient explaining each of  the treatment options.  At this time we will continue to monitor her symptoms.  I instructed her to contact me when her pain increases to the point that she would like to have treatment and at that time we can discuss nerve block versus RFA versus peripheral nerve stimulation.  Patient endorsed understanding.  Follow-up plan:   Return for patient will call to schedule F2F appt prn.      Left SSNB 01/19/23, Left C3,4 MBNB, Left axillary nerve block X2      Recent Visits Date  Type Provider Dept  05/20/23 Procedure visit Marcelino Nurse, MD [facility]  05/04/23 Office Visit Marcelino Nurse, MD [facility]  Showing recent visits within past 90 days and meeting all other requirements Today's Visits Date Type Provider Dept  07/01/23 Office Visit Marcelino Nurse, MD [facility]  Showing today's visits and meeting all other requirements Future Appointments No visits were found meeting these conditions. Showing future appointments within next 90 days and meeting all other requirements  I discussed the assessment and treatment plan with the patient. The patient was provided an opportunity to ask questions and all were answered. The patient agreed with the plan and demonstrated an understanding of the instructions.  Patient advised to call back or seek an in-person evaluation if the symptoms or condition worsens.  Duration of encounter: 15 minutes.  Total time on encounter, as per AMA guidelines included both the face-to-face and non-face-to-face time personally spent by the physician and/or other qualified health care professional(s) on the day of the encounter (includes time in activities that require the physician or other qualified health care professional and does not include time in activities normally performed by clinical staff). Physician's time may include the following activities when performed: Preparing to see the patient (e.g., pre-charting review of records, searching for previously ordered imaging, lab work, and nerve conduction tests) Review of prior analgesic pharmacotherapies. Reviewing PMP Interpreting ordered tests (e.g., lab work, imaging, nerve conduction tests) Performing post-procedure evaluations, including interpretation of diagnostic procedures Obtaining and/or reviewing separately obtained history Performing a medically appropriate examination and/or evaluation Counseling and educating the  patient/family/caregiver Ordering medications, tests, or procedures Referring and communicating with other health care professionals (when not separately reported) Documenting clinical information in the electronic or other health record Independently interpreting results (not separately reported) and communicating results to the patient/ family/caregiver Care coordination (not separately reported)  Note by: Nurse Marcelino, MD Date: 07/01/2023; Time: 2:21 PM

## 2023-07-01 NOTE — Patient Instructions (Signed)
____________________________________________________________________________________________  General Risks and Possible Complications  Patient Responsibilities: It is important that you read this as it is part of your informed consent. It is our duty to inform you of the risks and possible complications associated with treatments offered to you. It is your responsibility as a patient to read this and to ask questions about anything that is not clear or that you believe was not covered in this document.  Patient's Rights: You have the right to refuse treatment. You also have the right to change your mind, even after initially having agreed to have the treatment done. However, under this last option, if you wait until the last second to change your mind, you may be charged for the materials used up to that point.  Introduction: Medicine is not an exact science. Everything in Medicine, including the lack of treatment(s), carries the potential for danger, harm, or loss (which is by definition: Risk). In Medicine, a complication is a secondary problem, condition, or disease that can aggravate an already existing one. All treatments carry the risk of possible complications. The fact that a side effects or complications occurs, does not imply that the treatment was conducted incorrectly. It must be clearly understood that these can happen even when everything is done following the highest safety standards.  No treatment: You can choose not to proceed with the proposed treatment alternative. The "PRO(s)" would include: avoiding the risk of complications associated with the therapy. The "CON(s)" would include: not getting any of the treatment benefits. These benefits fall under one of three categories: diagnostic; therapeutic; and/or palliative. Diagnostic benefits include: getting information which can ultimately lead to improvement of the disease or symptom(s). Therapeutic benefits are those associated with the  successful treatment of the disease. Finally, palliative benefits are those related to the decrease of the primary symptoms, without necessarily curing the condition (example: decreasing the pain from a flare-up of a chronic condition, such as incurable terminal cancer).  General Risks and Complications: These are associated to most interventional treatments. They can occur alone, or in combination. They fall under one of the following six (6) categories: no benefit or worsening of symptoms; bleeding; infection; nerve damage; allergic reactions; and/or death. No benefits or worsening of symptoms: In Medicine there are no guarantees, only probabilities. No healthcare provider can ever guarantee that a medical treatment will work, they can only state the probability that it may. Furthermore, there is always the possibility that the condition may worsen, either directly, or indirectly, as a consequence of the treatment. Bleeding: This is more common if the patient is taking a blood thinner, either prescription or over the counter (example: Goody Powders, Fish oil, Aspirin, Garlic, etc.), or if suffering a condition associated with impaired coagulation (example: Hemophilia, cirrhosis of the liver, low platelet counts, etc.). However, even if you do not have one on these, it can still happen. If you have any of these conditions, or take one of these drugs, make sure to notify your treating physician. Infection: This is more common in patients with a compromised immune system, either due to disease (example: diabetes, cancer, human immunodeficiency virus [HIV], etc.), or due to medications or treatments (example: therapies used to treat cancer and rheumatological diseases). However, even if you do not have one on these, it can still happen. If you have any of these conditions, or take one of these drugs, make sure to notify your treating physician. Nerve Damage: This is more common when the treatment is an invasive    one, but it can also happen with the use of medications, such as those used in the treatment of cancer. The damage can occur to small secondary nerves, or to large primary ones, such as those in the spinal cord and brain. This damage may be temporary or permanent and it may lead to impairments that can range from temporary numbness to permanent paralysis and/or brain death. Allergic Reactions: Any time a substance or material comes in contact with our body, there is the possibility of an allergic reaction. These can range from a mild skin rash (contact dermatitis) to a severe systemic reaction (anaphylactic reaction), which can result in death. Death: In general, any medical intervention can result in death, most of the time due to an unforeseen complication. ____________________________________________________________________________________________  

## 2023-07-20 ENCOUNTER — Other Ambulatory Visit (INDEPENDENT_AMBULATORY_CARE_PROVIDER_SITE_OTHER): Payer: Medicaid Other

## 2023-07-20 DIAGNOSIS — D649 Anemia, unspecified: Secondary | ICD-10-CM

## 2023-07-20 LAB — CBC WITH DIFFERENTIAL/PLATELET
Basophils Absolute: 0 10*3/uL (ref 0.0–0.1)
Basophils Relative: 0.2 % (ref 0.0–3.0)
Eosinophils Absolute: 0.1 10*3/uL (ref 0.0–0.7)
Eosinophils Relative: 1 % (ref 0.0–5.0)
HCT: 34.3 % — ABNORMAL LOW (ref 36.0–46.0)
Hemoglobin: 11.4 g/dL — ABNORMAL LOW (ref 12.0–15.0)
Lymphocytes Relative: 40.8 % (ref 12.0–46.0)
Lymphs Abs: 3.7 10*3/uL (ref 0.7–4.0)
MCHC: 33.3 g/dL (ref 30.0–36.0)
MCV: 85 fL (ref 78.0–100.0)
Monocytes Absolute: 0.5 10*3/uL (ref 0.1–1.0)
Monocytes Relative: 5.7 % (ref 3.0–12.0)
Neutro Abs: 4.8 10*3/uL (ref 1.4–7.7)
Neutrophils Relative %: 52.3 % (ref 43.0–77.0)
Platelets: 384 10*3/uL (ref 150.0–400.0)
RBC: 4.03 Mil/uL (ref 3.87–5.11)
RDW: 14.3 % (ref 11.5–15.5)
WBC: 9.1 10*3/uL (ref 4.0–10.5)

## 2023-07-28 ENCOUNTER — Encounter: Payer: Self-pay | Admitting: Family

## 2023-07-28 ENCOUNTER — Other Ambulatory Visit: Payer: Self-pay | Admitting: Family

## 2023-07-28 DIAGNOSIS — D649 Anemia, unspecified: Secondary | ICD-10-CM

## 2023-08-14 ENCOUNTER — Other Ambulatory Visit: Payer: Self-pay | Admitting: Family

## 2023-08-14 DIAGNOSIS — I10 Essential (primary) hypertension: Secondary | ICD-10-CM

## 2023-08-18 ENCOUNTER — Ambulatory Visit: Payer: Medicare Other | Admitting: Student in an Organized Health Care Education/Training Program

## 2023-08-24 ENCOUNTER — Encounter: Payer: Self-pay | Admitting: Family

## 2023-08-24 ENCOUNTER — Telehealth: Payer: Self-pay

## 2023-08-24 ENCOUNTER — Ambulatory Visit: Payer: Medicaid Other | Admitting: Family

## 2023-08-24 VITALS — BP 130/78 | HR 81 | Temp 97.9°F | Ht 64.0 in | Wt 146.4 lb

## 2023-08-24 DIAGNOSIS — M25512 Pain in left shoulder: Secondary | ICD-10-CM | POA: Diagnosis not present

## 2023-08-24 DIAGNOSIS — G8929 Other chronic pain: Secondary | ICD-10-CM

## 2023-08-24 DIAGNOSIS — Z7984 Long term (current) use of oral hypoglycemic drugs: Secondary | ICD-10-CM | POA: Diagnosis not present

## 2023-08-24 DIAGNOSIS — I10 Essential (primary) hypertension: Secondary | ICD-10-CM

## 2023-08-24 DIAGNOSIS — E119 Type 2 diabetes mellitus without complications: Secondary | ICD-10-CM | POA: Diagnosis not present

## 2023-08-24 DIAGNOSIS — R7309 Other abnormal glucose: Secondary | ICD-10-CM

## 2023-08-24 DIAGNOSIS — Z7985 Long-term (current) use of injectable non-insulin antidiabetic drugs: Secondary | ICD-10-CM | POA: Diagnosis not present

## 2023-08-24 DIAGNOSIS — M542 Cervicalgia: Secondary | ICD-10-CM

## 2023-08-24 LAB — CBC WITH DIFFERENTIAL/PLATELET
Basophils Absolute: 0 10*3/uL (ref 0.0–0.1)
Basophils Relative: 0.5 % (ref 0.0–3.0)
Eosinophils Absolute: 0.1 10*3/uL (ref 0.0–0.7)
Eosinophils Relative: 1 % (ref 0.0–5.0)
HCT: 36.4 % (ref 36.0–46.0)
Hemoglobin: 12 g/dL (ref 12.0–15.0)
Lymphocytes Relative: 44.1 % (ref 12.0–46.0)
Lymphs Abs: 3.9 10*3/uL (ref 0.7–4.0)
MCHC: 33 g/dL (ref 30.0–36.0)
MCV: 86.4 fl (ref 78.0–100.0)
Monocytes Absolute: 0.5 10*3/uL (ref 0.1–1.0)
Monocytes Relative: 5.5 % (ref 3.0–12.0)
Neutro Abs: 4.4 10*3/uL (ref 1.4–7.7)
Neutrophils Relative %: 48.9 % (ref 43.0–77.0)
Platelets: 385 10*3/uL (ref 150.0–400.0)
RBC: 4.21 Mil/uL (ref 3.87–5.11)
RDW: 13.8 % (ref 11.5–15.5)
WBC: 8.9 10*3/uL (ref 4.0–10.5)

## 2023-08-24 LAB — COMPREHENSIVE METABOLIC PANEL
ALT: 12 U/L (ref 0–35)
AST: 17 U/L (ref 0–37)
Albumin: 4.6 g/dL (ref 3.5–5.2)
Alkaline Phosphatase: 49 U/L (ref 39–117)
BUN: 11 mg/dL (ref 6–23)
CO2: 26 meq/L (ref 19–32)
Calcium: 10.7 mg/dL — ABNORMAL HIGH (ref 8.4–10.5)
Chloride: 99 meq/L (ref 96–112)
Creatinine, Ser: 0.66 mg/dL (ref 0.40–1.20)
GFR: 95.1 mL/min (ref >60.00)
Glucose, Bld: 153 mg/dL — ABNORMAL HIGH (ref 70–99)
Potassium: 4.2 meq/L (ref 3.5–5.1)
Sodium: 137 meq/L (ref 135–145)
Total Bilirubin: 0.3 mg/dL (ref 0.2–1.2)
Total Protein: 8.3 g/dL (ref 6.0–8.3)

## 2023-08-24 LAB — POCT GLYCOSYLATED HEMOGLOBIN (HGB A1C): Hemoglobin A1C: 6.6 % — AB (ref 4.0–5.6)

## 2023-08-24 LAB — LIPID PANEL
Cholesterol: 164 mg/dL (ref 0–200)
HDL: 51.2 mg/dL (ref >39.00)
LDL Cholesterol: 78 mg/dL (ref 0–99)
NonHDL: 112.72
Total CHOL/HDL Ratio: 3
Triglycerides: 176 mg/dL — ABNORMAL HIGH (ref 0.0–149.0)
VLDL: 35.2 mg/dL (ref 0.0–40.0)

## 2023-08-24 MED ORDER — AMLODIPINE BESYLATE 5 MG PO TABS: 5.0000 mg | ORAL_TABLET | Freq: Every day | ORAL | 3 refills | Status: DC

## 2023-08-24 MED ORDER — SEMAGLUTIDE(0.25 OR 0.5MG/DOS) 2 MG/3ML ~~LOC~~ SOPN: 0.5000 mg | PEN_INJECTOR | SUBCUTANEOUS | 2 refills | Status: DC

## 2023-08-24 MED ORDER — LISINOPRIL 40 MG PO TABS: 40.0000 mg | ORAL_TABLET | Freq: Every day | ORAL | 3 refills | Status: DC

## 2023-08-24 MED ORDER — GABAPENTIN 100 MG PO CAPS: 100.0000 mg | ORAL_CAPSULE | Freq: Three times a day (TID) | ORAL | 3 refills | Status: DC

## 2023-08-24 MED ORDER — TRAMADOL HCL 50 MG PO TABS
50.0000 mg | ORAL_TABLET | Freq: Two times a day (BID) | ORAL | 2 refills | Status: DC | PRN
Start: 2023-08-24 — End: 2024-01-28

## 2023-08-24 MED ORDER — CYCLOBENZAPRINE HCL 5 MG PO TABS
5.0000 mg | ORAL_TABLET | Freq: Three times a day (TID) | ORAL | 1 refills | Status: DC | PRN
Start: 2023-08-24 — End: 2023-10-28

## 2023-08-24 MED ORDER — ROSUVASTATIN CALCIUM 10 MG PO TABS: ORAL_TABLET | ORAL | 2 refills | Status: DC

## 2023-08-24 MED ORDER — METFORMIN HCL ER 500 MG PO TB24: 1000.0000 mg | ORAL_TABLET | Freq: Two times a day (BID) | ORAL | 3 refills | Status: AC

## 2023-08-24 NOTE — Patient Instructions (Addendum)
   We will start gabapentin and titrate as follows as  needed for pain.  As you increase gabapentin you need to monitor for sedation as it can make you more sleepy.  At any point when you feel pain has responded,  you do not need to increase any further  Week 1: Start gabapentin one tablet 100 mg morning ,noon and evening Week 2: Take Gabapentin 200 mg ( take two of the 100mg  tablets together) morning, noon ,and evening Week 3: Take Gabapentin 300 mg ( take three of the 100mg  tablets together) morning, noon ,and evening  Please let me know how you are doing and what dose works best for your pain.   Nice to see you!

## 2023-08-24 NOTE — Progress Notes (Signed)
 Care Guide Pharmacy Note  08/24/2023 Name: Melissa Garrison MRN: [id number] DOB: 1961-03-30  Referred By: Allegra Grana, FNP Reason for referral: Care Coordination (Outreach to schedule with Pharm d )   Melissa Garrison is a 63 y.o. year old female who is a primary care patient of Allegra Grana, FNP.  Melissa Garrison was referred to the pharmacist for assistance related to: DMII  An unsuccessful telephone outreach was attempted today to contact the patient who was referred to the pharmacy team for assistance with medication assistance. Additional attempts will be made to contact the patient.  Penne Lash , RMA     [facility]  [facility], [facility] Guide  Direct Dial: [phone]  Website: [email or web address]

## 2023-08-24 NOTE — Progress Notes (Signed)
 Care Guide Pharmacy Note  08/24/2023 Name: Melissa Garrison MRN: [id number] DOB: 1960/08/30  Referred By: Allegra Grana, FNP Reason for referral: Care Coordination (Outreach to schedule with Pharm d )   Melissa Garrison is a 63 y.o. year old female who is a primary care patient of Allegra Grana, FNP.  Melissa Garrison was referred to the pharmacist for assistance related to: DMII  Successful contact was made with the patient to discuss pharmacy services.  Patient declines engagement at this time. Contact information was provided to the patient should they wish to reach out for assistance at a later time.  Penne Lash , RMA     [facility]  [facility], [facility] Guide  Direct Dial: [phone]  Website: [email or web address]

## 2023-08-24 NOTE — Progress Notes (Unsigned)
   Assessment & Plan:  Elevated glucose -     POCT glycosylated hemoglobin (Hb A1C)     Return precautions given.   Risks, benefits, and alternatives of the medications and treatment plan prescribed today were discussed, and patient expressed understanding.   Education regarding symptom management and diagnosis given to patient on AVS either electronically or printed.  No follow-ups on file.  Rennie Plowman, FNP  Subjective:    Patient ID: Melissa Garrison, female    DOB: 11/26/1960, 63 y.o.   MRN: [id number]  CC: Melissa Garrison is a 63 y.o. female who presents today for follow up.   HPI: Following with Dr Cherylann Ratel for left shoulder pain and numbness.  Pain is bothersome at night time.   She is taking tramadol 50mg     Feels well on ozempic 0.5mg . No consitpation, nausea.   Allergies: Patient has no known allergies. Current Outpatient Medications on File Prior to Visit  Medication Sig Dispense Refill   amLODipine (NORVASC) 5 MG tablet Take 1 tablet (5 mg total) by mouth daily. 90 tablet 3   Blood Glucose Monitoring Suppl DEVI 1 each by Does not apply route in the morning, at noon, and at bedtime. May substitute to any manufacturer covered by patient's insurance. 1 each 0   cyclobenzaprine (FLEXERIL) 5 MG tablet Take 1-2 tablets (5-10 mg total) by mouth 3 (three) times daily as needed for muscle spasms. 90 tablet 1   Lancets (FREESTYLE) lancets Use as instructed 100 each 12   lisinopril (ZESTRIL) 40 MG tablet TAKE ONE TABLET BY MOUTH ONCE A DAY 90 tablet 3   metFORMIN (GLUCOPHAGE-XR) 500 MG 24 hr tablet Take 2 tablets (1,000 mg total) by mouth 2 (two) times daily with a meal. 360 tablet 3   OneTouch Delica Lancets 33G MISC Used to check blood sugar once daily. 200 each 3   rosuvastatin (CRESTOR) 10 MG tablet Take 1 tablet daily three days per week 48 tablet 2   Semaglutide,0.25 or 0.5MG /DOS, 2 MG/3ML SOPN Inject 0.25 mg into the skin once a week. 3 mL 2   traMADol (ULTRAM) 50  MG tablet Take 1 tablet (50 mg total) by mouth 2 (two) times daily as needed. 60 tablet 2   No current facility-administered medications on file prior to visit.    Review of Systems    Objective:    BP 130/78   Pulse 81   Temp 97.9 F (36.6 C) (Oral)   Ht 5\' 4"  (1.626 m)   Wt 146 lb 6.4 oz (66.4 kg)   SpO2 99%   BMI 25.13 kg/m  BP Readings from Last 3 Encounters:  08/24/23 130/78  07/01/23 (!) 148/86  05/25/23 120/70   Wt Readings from Last 3 Encounters:  08/24/23 146 lb 6.4 oz (66.4 kg)  07/01/23 143 lb (64.9 kg)  05/25/23 143 lb 3.2 oz (65 kg)    Physical Exam

## 2023-08-25 LAB — MICROALBUMIN / CREATININE URINE RATIO
Creatinine,U: 57.2 mg/dL
Microalb Creat Ratio: 14.7 mg/g (ref 0.0–30.0)
Microalb, Ur: 0.8 mg/dL (ref 0.0–1.9)

## 2023-08-25 NOTE — Assessment & Plan Note (Signed)
 Lab Results  Component Value Date   HGBA1C 6.6 (A) 08/24/2023   Chronic, stable.  Continue metformin 1000 mg twice daily,  Ozempic 0.5mg .

## 2023-08-25 NOTE — Assessment & Plan Note (Signed)
 Suboptimal control.  Following with pain management.  Continue tramadol to 50 mg twice daily as needed, Flexeril 5 mg 3 times daily as needed.  Re encourage patient to start gabapentin 100mg  TID and titrate as provided on AVS.  Counseled on sedation. Close follow up.

## 2023-08-25 NOTE — Assessment & Plan Note (Signed)
 Chronic, stable. Continue amlodipine 5 mg daily, lisinopril 40 mg daily

## 2023-08-26 ENCOUNTER — Other Ambulatory Visit: Payer: Self-pay | Admitting: Family

## 2023-08-26 ENCOUNTER — Encounter: Payer: Self-pay | Admitting: Family

## 2023-08-26 DIAGNOSIS — R899 Unspecified abnormal finding in specimens from other organs, systems and tissues: Secondary | ICD-10-CM

## 2023-09-01 ENCOUNTER — Ambulatory Visit
Payer: Medicare Other | Attending: Student in an Organized Health Care Education/Training Program | Admitting: Student in an Organized Health Care Education/Training Program

## 2023-09-01 ENCOUNTER — Encounter: Payer: Self-pay | Admitting: Student in an Organized Health Care Education/Training Program

## 2023-09-01 VITALS — BP 152/77 | HR 104 | Temp 97.0°F | Ht 64.0 in | Wt 145.0 lb

## 2023-09-01 DIAGNOSIS — M25512 Pain in left shoulder: Secondary | ICD-10-CM | POA: Diagnosis not present

## 2023-09-01 DIAGNOSIS — S4432XS Injury of axillary nerve, left arm, sequela: Secondary | ICD-10-CM | POA: Insufficient documentation

## 2023-09-01 DIAGNOSIS — M75102 Unspecified rotator cuff tear or rupture of left shoulder, not specified as traumatic: Secondary | ICD-10-CM | POA: Insufficient documentation

## 2023-09-01 DIAGNOSIS — Z9889 Other specified postprocedural states: Secondary | ICD-10-CM | POA: Insufficient documentation

## 2023-09-01 DIAGNOSIS — M12812 Other specific arthropathies, not elsewhere classified, left shoulder: Secondary | ICD-10-CM | POA: Insufficient documentation

## 2023-09-01 DIAGNOSIS — G8929 Other chronic pain: Secondary | ICD-10-CM | POA: Insufficient documentation

## 2023-09-01 NOTE — Patient Instructions (Signed)
   ______________________________________________________________________    Procedure instructions  Stop blood-thinners  Do not eat or drink fluids (other than water) for 6 hours before your procedure  No water for 2 hours before your procedure  Take your blood pressure medicine with a sip of water  Arrive 30 minutes before your appointment  If sedation is planned, bring suitable driver. Pennie Banter, Benedetto Goad, & public transportation are NOT APPROVED)  Carefully read the "Preparing for your procedure" detailed instructions  If you have questions call us at [phone]  Procedure appointments are for procedures only. NO medication refills or new problem evaluations.   ______________________________________________________________________

## 2023-09-01 NOTE — Progress Notes (Signed)
 Safety precautions to be maintained throughout the outpatient stay will include: orient to surroundings, keep bed in low position, maintain call bell within reach at all times, provide assistance with transfer out of bed and ambulation.

## 2023-09-01 NOTE — Progress Notes (Signed)
 PROVIDER NOTE: Information contained herein reflects review and annotations entered in association with encounter. Interpretation of such information and data should be left to medically-trained personnel. Information provided to patient can be located elsewhere in the medical record under "Patient Instructions". Document created using STT-dictation technology, any transcriptional errors that may result from process are unintentional.    Patient: Melissa Garrison  Service Category: E/M  Provider: Edward Jolly, MD  DOB: Dec 28, 1960  DOS: 09/01/2023  Referring Provider: Allegra Grana, FNP  MRN: [id number]  Specialty: Interventional Pain Management  PCP: Allegra Grana, FNP  Type: Established Patient  Setting: Ambulatory outpatient    Location: Office  Delivery: Face-to-face     HPI  Ms. Melissa Garrison, a 63 y.o. year old female, is here today because of her Injury of left axillary nerve, sequela [S44.32XS]. Ms. Melissa Garrison's primary complain today is Shoulder Pain (left)  Pertinent problems: Ms. Melissa Garrison does not have any pertinent problems on file. Pain Assessment: Severity of Chronic pain is reported as a 8 /10. Location: Shoulder Left/radiates down to left elbow. Onset: More than a month ago. Quality: Sore, Aching, Burning, Constant, Throbbing. Timing: Constant. Modifying factor(s): propping arm up. Vitals:  height is 5\' 4"  (1.626 m) and weight is 145 lb (65.8 kg). Her temperature is 97 F (36.1 C) (abnormal). Her blood pressure is 152/77 (abnormal) and her pulse is 104 (abnormal). Her oxygen saturation is 99%.  BMI: Estimated body mass index is 24.89 kg/m as calculated from the following:   Height as of this encounter: 5\' 4"  (1.626 m).   Weight as of this encounter: 145 lb (65.8 kg). Last encounter: 07/01/2023. Last procedure: 05/20/2023.  Reason for encounter: Persistent left shoulder pain in the context of 3 prior shoulder surgeries.   History of Present Illness   The patient presents  with left shoulder pain.  She experiences persistent left shoulder pain, which is exacerbated by movement, particularly when moving her arm up and down. The pain has been ongoing and has not been fully relieved by previous treatments.  She has undergone two axillary nerve blocks on the left shoulder, which provided some relief, but the pain has returned. She uses Biofreeze for pain relief.  The pain significantly impacts her daily activities, as she describes it as 'pain, pain, pain, pain, pain, pain.'  She inquires about the possibility of the shoulder pain causing pain in the neck and back, which is confirmed as a possibility due to muscle connections from the neck to the shoulder.        ROS  Constitutional: Denies any fever or chills Gastrointestinal: No reported hemesis, hematochezia, vomiting, or acute GI distress Musculoskeletal:  Left shoulder pain Neurological: No reported episodes of acute onset apraxia, aphasia, dysarthria, agnosia, amnesia, paralysis, loss of coordination, or loss of consciousness  Medication Review  Blood Glucose Monitoring Suppl, OneTouch Delica Lancets 33G, Semaglutide(0.25 or 0.5MG /DOS), amLODipine, cyclobenzaprine, freestyle, gabapentin, lisinopril, metFORMIN, rosuvastatin, and traMADol  History Review  Allergy: Ms. Melissa Garrison has no known allergies. Drug: Ms. Melissa Garrison  reports that she does not currently use drugs. Alcohol:  reports that she does not currently use alcohol after a past usage of about 3.0 standard drinks of alcohol per week. Tobacco:  reports that she has been smoking cigarettes. She has a 5 pack-year smoking history. She has never used smokeless tobacco. Social: Ms. Melissa Garrison  reports that she has been smoking cigarettes. She has a 5 pack-year smoking history. She has never used smokeless tobacco. She reports that she does  not currently use alcohol after a past usage of about 3.0 standard drinks of alcohol per week. She reports that she does not  currently use drugs. Medical:  has a past medical history of Allergic rhinitis (07/11/2011), Diabetes mellitus without complication (HCC), Hepatitis C, Hepatitis C, chronic (HCC) (07/11/2011), Hyperlipidemia, Hypertension (07/11/2011), IV drug abuse (HCC), Vitamin D deficiency, and Wears dentures. Surgical: Ms. Melissa Garrison  has a past surgical history that includes Laparoscopic hysterectomy; Cholecystectomy (2011); Humerus surgery (Left); Hydradenitis excision (Right, 03/20/2017); Breast biopsy (Left, 12/05/2015); Colonoscopy (2017); Shoulder arthroscopy with subacromial decompression and open rotator cuff repair, open bicep tendon repair (Left, 01/04/2021); Capsular release (Left, 07/02/2021); Lysis of adhesion (Left, 07/02/2021); Closed manipulation shoulder with steroid injection (Left, 07/02/2021); Shoulder arthroscopy (Left, 07/02/2021); Colonoscopy with propofol (N/A, 05/20/2022); and Closed manipulation shoulder with steroid injection (Left, 08/11/2022). Family: family history includes Diabetes in her father; Hypertension in her brother and mother.  Laboratory Chemistry Profile   Renal Lab Results  Component Value Date   BUN 11 08/24/2023   CREATININE 0.66 08/24/2023   GFR 95.10 08/24/2023   GFRAA >60 03/16/2017   GFRNONAA >60 07/14/2022    Hepatic Lab Results  Component Value Date   AST 17 08/24/2023   ALT 12 08/24/2023   ALBUMIN 4.6 08/24/2023   ALKPHOS 49 08/24/2023    Electrolytes Lab Results  Component Value Date   NA 137 08/24/2023   K 4.2 08/24/2023   CL 99 08/24/2023   CALCIUM 10.7 (H) 08/24/2023    Bone Lab Results  Component Value Date   VD25OH 13.67 (L) 10/18/2021    Inflammation (CRP: Acute Phase) (ESR: Chronic Phase) No results found for: "CRP", "ESRSEDRATE", "LATICACIDVEN"       Note: Above Lab results reviewed.  Recent Imaging Review  CLINICAL DATA:  Rotator cuff tear. Patient reports having ongoing pain post surgery in July 2022.   EXAM: MRI OF THE LEFT  SHOULDER WITHOUT CONTRAST   TECHNIQUE: Multiplanar, multisequence MR imaging of the shoulder was performed. No intravenous contrast was administered.   COMPARISON:  MR shoulder 10/11/2020.   FINDINGS: Rotator cuff: Moderate supraspinatus tendinopathy on images 11-13 series 4.   Muscles: Subtle edema signal along the inferior margin of the supraspinatus muscle belly for example on image 22 series 7 and image 11 series 4, potentially reflecting low-grade inflammation.   Biceps long head: Prior biceps tenodesis. The biceps tendon is visualized in the bicipital groove although immediately in the vicinity of the attachment site the tendon is somewhat indistinct.   Acromioclavicular Joint: Interval subacromial decompression. Type I acromion. There is fluid in the subacromial subdeltoid bursa with numerous associated small scattered filling defects favoring synovitis.   Glenohumeral Joint: Moderate chondral thinning. Mild spurring of the humeral head glenoid. Mild indistinctness of the inferior glenohumeral ligament for example on image 10 series 4, this could reflect some low-grade synovitis in the region but may be incidental.   Labrum:  Unremarkable   Bones: Rotator cuff screws noted. Mild adjacent endosteal and marrow edema in the humeral head.   Other: No supplemental non-categorized findings.   IMPRESSION: 1. Interval rotator cuff repair, biceps tenodesis, and subacromial decompression. 2. Moderate supraspinatus tendinopathy distally. There is also some mild edema signal marginally within a small portion of the supraspinatus muscle belly which may reflect low-grade inflammation. 3. There is a moderate amount of fluid in the subacromial subdeltoid bursa with numerous associated small scattered filling defects favoring synovitis. 4. Moderate degenerative chondral thinning in the glenohumeral joint with mild  associated spurring. 5. Equivocal synovitis along the inferior  glenohumeral ligament. 6. Small amount of persistent edema signal along the and ostium in marrow of the humeral head near the rotator cuff screw sites.   Note: Reviewed        Physical Exam  General appearance: Well nourished, well developed, and well hydrated. In no apparent acute distress Mental status: Alert, oriented x 3 (person, place, & time)       Respiratory: No evidence of acute respiratory distress Eyes: PERLA Vitals: BP (!) 152/77   Pulse (!) 104   Temp (!) 97 F (36.1 C)   Ht 5\' 4"  (1.626 m)   Wt 145 lb (65.8 kg)   SpO2 99%   BMI 24.89 kg/m  BMI: Estimated body mass index is 24.89 kg/m as calculated from the following:   Height as of this encounter: 5\' 4"  (1.626 m).   Weight as of this encounter: 145 lb (65.8 kg). Ideal: Ideal body weight: 54.7 kg (120 lb 9.5 oz) Adjusted ideal body weight: 59.1 kg (130 lb 5.7 oz)  Improvement in left shoulder pain and range of motion after left axillary nerve block.   Assessment   Diagnosis  1. Injury of left axillary nerve, sequela   2. Chronic left shoulder pain   3. Hx of shoulder surgery   4. Left rotator cuff tear arthropathy      Updated Problems: No problems updated.  Plan of Care  Problem-specific:  Assessment and Plan    Chronic shoulder pain  due to left axillary nerve dysfunction in context of 3 previous shoulder surgeries. Axillary neuropathic pain, s/p 2 positive diagnostic axillary nerve blocks that provided >50% pain relief: Chronic left shoulder pain worsens with movement. Previous axillary nerve blocks provided partial relief (has ad 2), indicating neuropathic pain. A SPRINT peripheral nerve stimulator is planned, as neuromodulation may benefit her. The stimulator will alter pain signal transmission to the brain, reducing pain perception. This minimally invasive procedure, performed in-office, involves placing a wire near the nerve to deliver nonpainful stimulation, expected to provide relief for up to two  months. Risks include minor discomfort similar to nerve block. She consented to the procedure, understanding the need to avoid submersion in water post-procedure. Proceed with peripheral nerve stimulator placement in the left shoulder for left axillary nerve. Administer Valium prior to aid relaxation. Instruct on stimulator use, including avoiding water submersion and pools for 60 days. Arrange follow-up calls from the device representative every 3-4 days. Advise using heat therapy and Biofreeze for muscle relaxation.  I explained to pt how Sprint peripheral nerve stimulation (PNS)  is typically considered for patients with chronic, localized pain that is not responding to conservative treatments such as medications, physical therapy, or injections.  The SPRINT peripheral nerve stimulator is designed for short-term, percutaneous use (approximately 60 days) to modulate pain through targeted nerve stimulation. Unlike traditional permanent implants, SPRINT is temporary but can lead to long-lasting pain relief by altering pain signals.  We discussed the risks and benefits of peripheral nerve stimulation. Benefits: minimally invasive, does not require permanent implantation, can offer significant pain relief, improving function and quality of life, may reduce the need for long-term opioid use. The risks/challenges include (but not limited to):  infection or irritation at the stimulation site, discomfort from electrode placement, risk of incomplete pain relief or temporary relief post-removal, limited to short-term therapy, which may be a disadvantage in chronic, refractory cases  Radiating neck pain   Pain radiates from the shoulder  to the neck, likely due to muscle connections between the neck, trapezius, and shoulder, possibly secondary to the shoulder condition and exacerbated by inflammation and movement. Recommend heat therapy to relax the muscles in the neck and shoulder area.       Ms. Melissa Garrison  has a current medication list which includes the following long-term medication(s): amlodipine, gabapentin, lisinopril, metformin, and rosuvastatin.  Pharmacotherapy (Medications Ordered): No orders of the defined types were placed in this encounter.  Orders:  Orders Placed This Encounter  Procedures   Implantable Peripheral Nerve Stimulator    Standing Status:   Future    Expiration Date:   12/02/2023    Scheduling Instructions:     Procedure: Temporary implantable peripheral nerve stimulator     Side:  LEFT     Nerve site: left axillary     Sedation: PO Valium     Timeframe: ASAA    Location of Procedure:   [facility]   Follow-up plan:   Return in about 22 days (around 09/23/2023) for Left axillary PNS, in clinic (PO Valium 5mg ).      Left SSNB 01/19/23, Left C3,4 MBNB, Left axillary nerve block X2       Recent Visits Date Type Provider Dept  07/01/23 Office Visit Edward Jolly, MD [facility]  Showing recent visits within past 90 days and meeting all other requirements Today's Visits Date Type Provider Dept  09/01/23 Office Visit Edward Jolly, MD [facility]  Showing today's visits and meeting all other requirements Future Appointments No visits were found meeting these conditions. Showing future appointments within next 90 days and meeting all other requirements  I discussed the assessment and treatment plan with the patient. The patient was provided an opportunity to ask questions and all were answered. The patient agreed with the plan and demonstrated an understanding of the instructions.  Patient advised to call back or seek an in-person evaluation if the symptoms or condition worsens.  Duration of encounter: .  Total time on encounter, as per AMA guidelines included both the face-to-face and non-face-to-face time personally spent by the physician and/or other qualified health care professional(s) on the day of the  encounter (includes time in activities that require the physician or other qualified health care professional and does not include time in activities normally performed by clinical staff). Physician's time may include the following activities when performed: Preparing to see the patient (e.g., pre-charting review of records, searching for previously ordered imaging, lab work, and nerve conduction tests) Review of prior analgesic pharmacotherapies. Reviewing PMP Interpreting ordered tests (e.g., lab work, imaging, nerve conduction tests) Performing post-procedure evaluations, including interpretation of diagnostic procedures Obtaining and/or reviewing separately obtained history Performing a medically appropriate examination and/or evaluation Counseling and educating the patient/family/caregiver Ordering medications, tests, or procedures Referring and communicating with other health care professionals (when not separately reported) Documenting clinical information in the electronic or other health record Independently interpreting results (not separately reported) and communicating results to the patient/ family/caregiver Care coordination (not separately reported)  Note by: Edward Jolly, MD Date: 09/01/2023; Time: 10:44 AM

## 2023-10-07 ENCOUNTER — Ambulatory Visit
Attending: Student in an Organized Health Care Education/Training Program | Admitting: Student in an Organized Health Care Education/Training Program

## 2023-10-07 ENCOUNTER — Encounter: Payer: Self-pay | Admitting: Student in an Organized Health Care Education/Training Program

## 2023-10-07 DIAGNOSIS — M75102 Unspecified rotator cuff tear or rupture of left shoulder, not specified as traumatic: Secondary | ICD-10-CM | POA: Diagnosis not present

## 2023-10-07 DIAGNOSIS — M25512 Pain in left shoulder: Secondary | ICD-10-CM | POA: Diagnosis present

## 2023-10-07 DIAGNOSIS — S4432XS Injury of axillary nerve, left arm, sequela: Secondary | ICD-10-CM | POA: Diagnosis present

## 2023-10-07 DIAGNOSIS — X58XXXS Exposure to other specified factors, sequela: Secondary | ICD-10-CM | POA: Insufficient documentation

## 2023-10-07 DIAGNOSIS — M12812 Other specific arthropathies, not elsewhere classified, left shoulder: Secondary | ICD-10-CM | POA: Diagnosis not present

## 2023-10-07 DIAGNOSIS — S4432XD Injury of axillary nerve, left arm, subsequent encounter: Secondary | ICD-10-CM

## 2023-10-07 DIAGNOSIS — G8929 Other chronic pain: Secondary | ICD-10-CM | POA: Diagnosis present

## 2023-10-07 DIAGNOSIS — Z9889 Other specified postprocedural states: Secondary | ICD-10-CM | POA: Insufficient documentation

## 2023-10-07 MED ORDER — DIAZEPAM 5 MG PO TABS
ORAL_TABLET | ORAL | Status: AC
  Filled 2023-10-07: qty 1

## 2023-10-07 MED ORDER — LIDOCAINE HCL 2 % IJ SOLN
20.0000 mL | Freq: Once | INTRAMUSCULAR | Status: AC
  Administered 2023-10-07: 200 mg

## 2023-10-07 MED ORDER — LIDOCAINE HCL (PF) 2 % IJ SOLN
INTRAMUSCULAR | Status: AC
  Filled 2023-10-07: qty 10

## 2023-10-07 MED ORDER — ROPIVACAINE HCL 2 MG/ML IJ SOLN
INTRAMUSCULAR | Status: AC
  Filled 2023-10-07: qty 20

## 2023-10-07 MED ORDER — ROPIVACAINE HCL 2 MG/ML IJ SOLN
9.0000 mL | Freq: Once | INTRAMUSCULAR | Status: AC
  Administered 2023-10-07: 9 mL via PERINEURAL

## 2023-10-07 MED ORDER — DIAZEPAM 5 MG PO TABS
10.0000 mg | ORAL_TABLET | ORAL | Status: AC
  Administered 2023-10-07: 10 mg via ORAL

## 2023-10-07 NOTE — Progress Notes (Signed)
 Safety precautions to be maintained throughout the outpatient stay will include: orient to surroundings, keep bed in low position, maintain call bell within reach at all times, provide assistance with transfer out of bed and ambulation.

## 2023-10-07 NOTE — Patient Instructions (Signed)
 ______________________________________________________________________    Post-Procedure Discharge Instructions  Instructions: Apply ice:  Purpose: This will minimize any swelling and discomfort after procedure.  When: Day of procedure, as soon as you get home. How: Fill a plastic sandwich bag with crushed ice. Cover it with a small towel and apply to injection site. How long: (15 min on, 15 min off) Apply for 15 minutes then remove x 15 minutes.  Repeat sequence on day of procedure, until you go to bed. Apply heat:  Purpose: To treat any soreness and discomfort from the procedure. When: Starting the next day after the procedure. How: Apply heat to procedure site starting the day following the procedure. How long: May continue to repeat daily, until discomfort goes away. Food intake: Start with clear liquids (like water) and advance to regular food, as tolerated.  Physical activities: Keep activities to a minimum for the first 8 hours after the procedure. After that, then as tolerated. Driving: If you have received any sedation, be responsible and do not drive. You are not allowed to drive for 24 hours after having sedation. Blood thinner: (Applies only to those taking blood thinners) You may restart your blood thinner 6 hours after your procedure. Insulin: (Applies only to Diabetic patients taking insulin) As soon as you can eat, you may resume your normal dosing schedule. Infection prevention: Keep procedure site clean and dry. Shower daily and clean area with soap and water. Post-procedure Pain Diary: Extremely important that this be done correctly and accurately. Recorded information will be used to determine the next step in treatment. For the purpose of accuracy, follow these rules: Evaluate only the area treated. Do not report or include pain from an untreated area. For the purpose of this evaluation, ignore all other areas of pain, except for the treated area. After your procedure,  avoid taking a long nap and attempting to complete the pain diary after you wake up. Instead, set your alarm clock to go off every hour, on the hour, for the initial 8 hours after the procedure. Document the duration of the numbing medicine, and the relief you are getting from it. Do not go to sleep and attempt to complete it later. It will not be accurate. If you received sedation, it is likely that you were given a medication that may cause amnesia. Because of this, completing the diary at a later time may cause the information to be inaccurate. This information is needed to plan your care. Follow-up appointment: Keep your post-procedure follow-up evaluation appointment after the procedure (usually 2 weeks for most procedures, 6 weeks for radiofrequencies). DO NOT FORGET to bring you pain diary with you.   Expect: (What should I expect to see with my procedure?) From numbing medicine (AKA: Local Anesthetics): Numbness or decrease in pain. You may also experience some weakness, which if present, could last for the duration of the local anesthetic. Onset: Full effect within 15 minutes of injected. Duration: It will depend on the type of local anesthetic used. On the average, 1 to 8 hours.  From steroids (Applies only if steroids were used): Decrease in swelling or inflammation. Once inflammation is improved, relief of the pain will follow. Onset of benefits: Depends on the amount of swelling present. The more swelling, the longer it will take for the benefits to be seen. In some cases, up to 10 days. Duration: Steroids will stay in the system x 2 weeks. Duration of benefits will depend on multiple posibilities including persistent irritating factors. Side-effects: If  present, they may typically last 2 weeks (the duration of the steroids). Frequent: Cramps (if they occur, drink Gatorade and take over-the-counter Magnesium 450-500 mg once to twice a day); water retention with temporary weight gain;  increases in blood sugar; decreased immune system response; increased appetite. Occasional: Facial flushing (red, warm cheeks); mood swings; menstrual changes. Uncommon: Long-term decrease or suppression of natural hormones; bone thinning. (These are more common with higher doses or more frequent use. This is why we prefer that our patients avoid having any injection therapies in other practices.)  Very Rare: Severe mood changes; psychosis; aseptic necrosis. From procedure: Some discomfort is to be expected once the numbing medicine wears off. This should be minimal if ice and heat are applied as instructed.  Call if: (When should I call?) You experience numbness and weakness that gets worse with time, as opposed to wearing off. New onset bowel or bladder incontinence. (Applies only to procedures done in the spine)  Emergency Numbers: Durning business hours (Monday - Thursday, 8:00 AM - 4:00 PM) (Friday, 9:00 AM - 12:00 Noon): [phone] After hours: [phone] NOTE: If you are having a problem and are unable connect with, or to talk to a provider, then go to your nearest urgent care or emergency department. If the problem is serious and urgent, please call 911. ______________________________________________________________________     Instruction provided by [employer] representative.

## 2023-10-07 NOTE — Progress Notes (Signed)
 PROVIDER NOTE: Interpretation of information contained herein should be left to medically-trained personnel. Specific patient instructions are provided elsewhere under "Patient Instructions" section of medical record. This document was created in part using STT-dictation technology, any transcriptional errors that may result from this process are unintentional.  Patient: Melissa Garrison Type: Established DOB: 10-05-1960 MRN: [id number] PCP: Allegra Grana, FNP  Service: Procedure DOS: 10/07/2023 Setting: Ambulatory Location: Ambulatory outpatient facility Delivery: Face-to-face Provider: Edward Jolly, MD Specialty: Interventional Pain Management Specialty designation: 09 Location: Outpatient facility Ref. Prov.: Edward Jolly, MD       Interventional Therapy   Primary Reason for Admission: Surgical management of chronic pain condition.  Procedure:  Anesthesia, Analgesia, Anxiolysis:  Type: SPRINTTM Peripheral Nerve Field Stimulator (PNS) MicroLeadTM Implant Purpose: Therapeutic Region: Left Axillary Nerve   Anesthesia: Local (1-2% Lidocaine)  Sedation: Minimal PO Valium Guidance: Ultrasound            1. Injury of left axillary nerve, sequela   2. Chronic left shoulder pain   3. Hx of shoulder surgery   4. Left rotator cuff tear arthropathy    NAS-11 Pain score:   Pre-procedure: 9 /10   Post-procedure: 0-No pain/10   H&P (Pre-op Assessment):  Melissa Garrison is a 63 y.o. (year old), female patient, seen today for interventional treatment. She  has a past surgical history that includes Laparoscopic hysterectomy; Cholecystectomy (2011); Humerus surgery (Left); Hydradenitis excision (Right, 03/20/2017); Breast biopsy (Left, 12/05/2015); Colonoscopy (2017); Shoulder arthroscopy with subacromial decompression and open rotator cuff repair, open bicep tendon repair (Left, 01/04/2021); Capsular release (Left, 07/02/2021); Lysis of adhesion (Left, 07/02/2021); Closed manipulation shoulder  with steroid injection (Left, 07/02/2021); Shoulder arthroscopy (Left, 07/02/2021); Colonoscopy with propofol (N/A, 05/20/2022); and Closed manipulation shoulder with steroid injection (Left, 08/11/2022).  Initial Vital Signs:  Pulse/EKG Rate: 90  Temp: (!) 96.8 F (36 C) Resp: 16 BP: (!) 141/69 SpO2: 100 %  BMI: Estimated body mass index is 24.89 kg/m as calculated from the following:   Height as of this encounter: 5\' 4"  (1.626 m).   Weight as of this encounter: 145 lb (65.8 kg).  Risk Assessment: Allergies: Reviewed. She has no known allergies.  Allergy Precautions: None required Coagulopathies: Reviewed. None identified.  Blood-thinner therapy: None at this time Active Infection(s): Reviewed. None identified. Melissa Garrison is afebrile  Site Confirmation: Melissa Garrison was asked to confirm the procedure and laterality before marking the site, which she did. Procedure checklist: Completed Consent: Before the procedure and under the influence of no sedative(s), amnesic(s), or anxiolytics, the patient was informed of the treatment options, risks and possible complications. To fulfill our ethical and legal obligations, as recommended by the American Medical Association's Code of Ethics, I have informed the patient of my clinical impression; the nature and purpose of the treatment or procedure; the risks, benefits, and possible complications of the intervention; the alternatives, including doing nothing; the risk(s) and benefit(s) of the alternative treatment(s) or procedure(s); and the risk(s) and benefit(s) of doing nothing.  Melissa Garrison was provided with information about the general risks and possible complications associated with most interventional procedures. These include, but are not limited to: failure to achieve desired goals, infection, bleeding, organ or nerve damage, allergic reactions, paralysis, and/or death.  In addition, she was informed of those risks and possible complications  associated to this particular procedure, which include, but are not limited to: damage to the implant; failure to decrease pain; local, systemic, or serious CNS infections, intraspinal abscess with possible cord compression  and paralysis, or life-threatening such as meningitis; intrathecal and/or epidural bleeding with formation of hematoma with possible spinal cord compression and permanent paralysis; organ damage; nerve injury or damage with subsequent sensory, motor, and/or autonomic system dysfunction, resulting in transient or permanent pain, numbness, and/or weakness of one or several areas of the body; allergic reactions, either minor or major life-threatening, such as anaphylactic or anaphylactoid reactions.  Furthermore, Melissa Garrison was informed of those risks and complications associated with the medications. These include, but are not limited to: allergic reactions (i.e.: anaphylactic or anaphylactoid reactions); arrhythmia;  Hypotension/hypertension; cardiovascular collapse; respiratory depression and/or shortness of breath; swelling or edema; medication-induced neural toxicity; particulate matter embolism and blood vessel occlusion with resultant organ, and/or nervous system infarction and permanent paralysis.  Finally, she was informed that Medicine is not an exact science; therefore, there is also the possibility of unforeseen or unpredictable risks and/or possible complications that may result in a catastrophic outcome. The patient indicated having understood very clearly. We have given the patient no guarantees and we have made no promises. Enough time was given to the patient to ask questions, all of which were answered to the patient's satisfaction. Melissa Garrison has indicated that she wanted to continue with the procedure. Attestation: I, the ordering provider, attest that I have discussed with the patient the benefits, risks, side-effects, alternatives, likelihood of achieving goals, and  potential problems during recovery for the procedure that I have provided informed consent. Date  Time: 10/07/2023  7:40 AM  Pre-Procedure Preparation:  Monitoring: As per clinic protocol. Respiration, ETCO2, SpO2, BP, heart rate and rhythm monitor placed and checked for adequate function Safety Precautions: Patient was assessed for positional comfort and pressure points before starting the procedure. Time-out: I initiated and conducted the "Time-out" before starting the procedure, as per protocol. The patient was asked to participate by confirming the accuracy of the "Time Out" information. Verification of the correct person, site, and procedure were performed and confirmed by me, the nursing staff, and the patient. "Time-out" conducted as per Joint Commission's Universal Protocol (UP.01.01.01). Time: 0839 Start Time: 0839 hrs.  Description of Procedure Process:   Position: sitting with left arm at side Target Area: <1 cm from targeted nerve ( axillary ) Area Prepped: Entire  left deltoid Region Prepping solution: ChloraPrep (2% chlorhexidine gluconate and 70% isopropyl alcohol) Safety Precautions: Safe injection practices and needle disposal techniques used. Medications properly checked for expiration dates. SDV (single dose vial) medications used. Aspiration looking for blood return and/or CSF was conducted prior to all injections. At no point did I inject any substances, as a needle was being advanced. No attempts were made at seeking any paresthesias.  Description of the Procedure   Technique: The patient was positioned sitting upright with her left arm resting at her side in a neutral position to allow access to the  shoulder and upper arm. The area was prepped and draped in a sterile fashion.  Using a high-frequency linear ultrasound transducer, the axillary nerve was identified in the region of the deltoid muscle, below the acromion, adjacent to the posterior circumflex humeral artery  and inferior to the surgical neck of the humerus. After identifying the nerve, 1% lidocaine was used to anesthetize the skin and subcutaneous tissues.  A 20G introducer needle was advanced in-plane under continuous ultrasound guidance toward the axillary nerve at the level of the deltoid. Once the needle tip was confirmed in close proximity to the nerve without vascular uptake (aspiration negative), the [employer]  lead was inserted through the introducer and deployed. Positioning was confirmed with ultrasound visualization and by patient-reported concordant paresthesia and muscle contraction over the affected area.  The lead was secured per [employer] protocol and connected to the external pulse generator. Stimulation parameters were tested and optimized in coordination with the [employer] representative. The patient tolerated the procedure well without immediate complications.  Findings: Successful placement of a peripheral nerve stimulator lead adjacent to the left axillary nerve at the deltoid with appropriate coverage of the symptomatic area.  Complications: None  Plan: The stimulator will remain in place for up to 60 days. The patient was instructed on care and usage and will follow up to assess clinical response and discuss long-term management.  This was done with ultrasound guidance  Vitals:   10/07/23 0746 10/07/23 0836 10/07/23 0841 10/07/23 0853  BP: (!) 141/69 115/75 123/68 111/67  Pulse: 90 89 88 (!) 9  Resp: 16 16 16 20   Temp: (!) 96.8 F (36 C)     TempSrc: Temporal     SpO2: 100% 100% 100% 100%  Weight: 145 lb (65.8 kg)     Height: 5\' 4"  (1.626 m)      Start Time: 0839 hrs. End Time: 0852 hrs.   Antibiotic Prophylaxis:   Anti-infectives (From admission, onward)    None      Indication(s): None identified  Post-operative Assessment:  Post-procedure Vital Signs:  Pulse/HCG Rate: (!) 9  Temp: (!) 96.8 F (36 C) Resp: 20 BP: 111/67 SpO2: 100 %  Complications: No  immediate post-treatment complications observed by team, or reported by patient.  Note: The patient tolerated the entire procedure well. A repeat set of vitals were taken after the procedure and the patient was kept under observation following institutional policy, for this type of procedure. Post-procedural neurological assessment was performed, showing return to baseline, prior to discharge. The patient was provided with post-procedure discharge instructions, including a section on how to identify potential problems. Should any problems arise concerning this procedure, the patient was given instructions to immediately contact us, at any time, without hesitation. In any case, we plan to contact the patient by telephone for a follow-up status report regarding this interventional procedure.  Comments:  No additional relevant information.  Plan of Care  Orders:    Medications administered: We administered lidocaine, ropivacaine (PF) 2 mg/mL (0.2%), and diazepam.  See the medical record for exact dosing, route, and time of administration.  Follow-up plan:   Return in about 2 months (around 12/07/2023), or PPE F2F.       Left SSNB 01/19/23, Left C3,4 MBNB, Left axillary nerve block X2; left peripheral axillary nerve stimulator 10/07/23    Recent Visits Date Type Provider Dept  09/01/23 Office Visit Edward Jolly, MD [facility]  Showing recent visits within past 90 days and meeting all other requirements Today's Visits Date Type Provider Dept  10/07/23 Procedure visit Edward Jolly, MD [facility]  Showing today's visits and meeting all other requirements Future Appointments Date Type Provider Dept  12/07/23 Appointment Edward Jolly, MD [facility]  Showing future appointments within next 90 days and meeting all other requirements  Disposition: Discharge home  Discharge (Date  Time): 10/07/2023; 0902 (to recovery for teaching with representative from [employer])  hrs.   Primary Care Physician: Allegra Grana, FNP Location: [facility] Note by: Edward Jolly, MD (TTS technology used. I apologize for any typographical errors that were not detected and corrected.) Date:  10/07/2023; Time: 9:41 AM

## 2023-10-08 ENCOUNTER — Telehealth: Payer: Self-pay

## 2023-10-08 NOTE — Telephone Encounter (Signed)
 No issues post-procedure.

## 2023-10-19 ENCOUNTER — Other Ambulatory Visit (HOSPITAL_COMMUNITY): Payer: Self-pay

## 2023-10-19 ENCOUNTER — Telehealth: Payer: Self-pay | Admitting: Pharmacy Technician

## 2023-10-19 DIAGNOSIS — E119 Type 2 diabetes mellitus without complications: Secondary | ICD-10-CM

## 2023-10-19 MED ORDER — SEMAGLUTIDE(0.25 OR 0.5MG/DOS) 2 MG/3ML ~~LOC~~ SOPN: 0.5000 mg | PEN_INJECTOR | SUBCUTANEOUS | 2 refills | Status: DC

## 2023-10-19 NOTE — Telephone Encounter (Signed)
 Pt has been notified, Ozempic  sent in to pharmacy and pt scheduled f/up for 10/28/23

## 2023-10-19 NOTE — Telephone Encounter (Signed)
 NOTED

## 2023-10-19 NOTE — Addendum Note (Signed)
 Addended by: Josslyn Ciolek on: 10/19/2023 04:06 PM   Modules accepted: Orders

## 2023-10-19 NOTE — Telephone Encounter (Signed)
 Pharmacy Patient Advocate Encounter  Received notification from [employer] that Prior Authorization for Ozempic  (0.25 or 0.5 MG/DOSE) 2MG /3ML pen-injectors has been APPROVED from 10/19/23 to 06/22/24. Ran test claim, Copay is $0.00. This test claim was processed through [facility]- copay amounts may vary at other pharmacies due to pharmacy/plan contracts, or as the patient moves through the different stages of their insurance plan.   PA #/Case ID/Reference #: [id number]

## 2023-10-19 NOTE — Telephone Encounter (Signed)
 Pharmacy Patient Advocate Encounter   Received notification from CoverMyMeds that prior authorization for Ozempic  (0.25 or 0.5 MG/DOSE) 2MG /3ML pen-injectors is required/requested.   Insurance verification completed.   The patient is insured through [facility] .   Per test claim: PA required; PA started via CoverMyMeds. KEY [id number] . Waiting for clinical questions to populate.

## 2023-10-25 ENCOUNTER — Telehealth: Payer: Self-pay | Admitting: Family

## 2023-10-25 NOTE — Telephone Encounter (Signed)
 Reviewed chart for urine microalbumin ratio.  Obtained 08/24/2023, <30 Will monitor.

## 2023-10-28 ENCOUNTER — Encounter: Payer: Self-pay | Admitting: Family

## 2023-10-28 ENCOUNTER — Ambulatory Visit (INDEPENDENT_AMBULATORY_CARE_PROVIDER_SITE_OTHER): Admitting: Family

## 2023-10-28 VITALS — BP 136/84 | HR 78 | Temp 97.2°F | Ht 64.0 in | Wt 145.6 lb

## 2023-10-28 DIAGNOSIS — M25512 Pain in left shoulder: Secondary | ICD-10-CM | POA: Diagnosis not present

## 2023-10-28 DIAGNOSIS — I10 Essential (primary) hypertension: Secondary | ICD-10-CM | POA: Diagnosis not present

## 2023-10-28 DIAGNOSIS — M542 Cervicalgia: Secondary | ICD-10-CM

## 2023-10-28 DIAGNOSIS — G8929 Other chronic pain: Secondary | ICD-10-CM | POA: Diagnosis not present

## 2023-10-28 DIAGNOSIS — Z1231 Encounter for screening mammogram for malignant neoplasm of breast: Secondary | ICD-10-CM | POA: Diagnosis not present

## 2023-10-28 MED ORDER — AMLODIPINE BESYLATE 5 MG PO TABS: 5.0000 mg | ORAL_TABLET | Freq: Every day | ORAL | 3 refills | Status: AC

## 2023-10-28 MED ORDER — LISINOPRIL 40 MG PO TABS
40.0000 mg | ORAL_TABLET | Freq: Every day | ORAL | 3 refills | Status: AC
Start: 2023-10-28 — End: ?

## 2023-10-28 MED ORDER — CYCLOBENZAPRINE HCL 5 MG PO TABS: 5.0000 mg | ORAL_TABLET | Freq: Three times a day (TID) | ORAL | 1 refills | Status: DC | PRN

## 2023-10-28 NOTE — Assessment & Plan Note (Signed)
 Chronic, improved.  Continue gabapentin  100 mg 3 times daily.  Will continue to follow with implantation of neurostimulator, Dr. Rhesa Celeste

## 2023-10-28 NOTE — Progress Notes (Signed)
 Assessment & Plan:  Encounter for screening mammogram for malignant neoplasm of breast -     3D Screening Mammogram, Left and Right; Future  Essential hypertension -     amLODIPine  Besylate; Take 1 tablet (5 mg total) by mouth daily.  Dispense: 90 tablet; Refill: 3 -     Lisinopril ; Take 1 tablet (40 mg total) by mouth daily.  Dispense: 90 tablet; Refill: 3  Neck pain -     Cyclobenzaprine  HCl; Take 1-2 tablets (5-10 mg total) by mouth 3 (three) times daily as needed for muscle spasms.  Dispense: 90 tablet; Refill: 1  Chronic left shoulder pain Assessment & Plan: Chronic, improved.  Continue gabapentin  100 mg 3 times daily.  Will continue to follow with implantation of neurostimulator, Dr. Rhesa Celeste      Return precautions given.   Risks, benefits, and alternatives of the medications and treatment plan prescribed today were discussed, and patient expressed understanding.   Education regarding symptom management and diagnosis given to patient on AVS either electronically or printed.  Return in about 3 months (around 01/28/2024).  Bascom Bossier, FNP  Subjective:    Patient ID: Melissa Garrison, female    DOB: [birth year], 63 y.o.   MRN: [id number]  CC: Melissa Garrison is a 63 y.o. female who presents today for follow up.   HPI: Left arm pain has significantly improved with nerve stimulator.  Compliant gabapentin  100mg  tid      Compliant with tramadol  50 mg twice daily as needed.    She is compliant with vitamin D .    Allergies: Patient has no known allergies. Current Outpatient Medications on File Prior to Visit  Medication Sig Dispense Refill   Blood Glucose Monitoring Suppl DEVI 1 each by Does not apply route in the morning, at noon, and at bedtime. May substitute to any manufacturer covered by patient's insurance. 1 each 0   gabapentin  (NEURONTIN ) 100 MG capsule Take 1 capsule (100 mg total) by mouth 3 (three) times daily. 90 capsule 3   Lancets (FREESTYLE) lancets  Use as instructed 100 each 12   metFORMIN  (GLUCOPHAGE -XR) 500 MG 24 hr tablet Take 2 tablets (1,000 mg total) by mouth 2 (two) times daily with a meal. 360 tablet 3   OneTouch Delica Lancets 33G MISC Used to check blood sugar once daily. 200 each 3   rosuvastatin  (CRESTOR ) 10 MG tablet Take 1 tablet daily three days per week 48 tablet 2   Semaglutide ,0.25 or 0.5MG /DOS, 2 MG/3ML SOPN Inject 0.5 mg into the skin once a week. 3 mL 2   traMADol  (ULTRAM ) 50 MG tablet Take 1 tablet (50 mg total) by mouth 2 (two) times daily as needed. 60 tablet 2   No current facility-administered medications on file prior to visit.    Review of Systems  Constitutional:  Negative for chills and fever.  Respiratory:  Negative for cough.   Cardiovascular:  Negative for chest pain and palpitations.  Gastrointestinal:  Negative for nausea and vomiting.  Musculoskeletal:  Positive for arthralgias.      Objective:    BP 136/84   Pulse 78   Temp (!) 97.2 F (36.2 C) (Oral)   Ht 5\' 4"  (1.626 m)   Wt 145 lb 9.6 oz (66 kg)   SpO2 97%   BMI 24.99 kg/m  BP Readings from Last 3 Encounters:  10/28/23 136/84  10/07/23 111/67  09/01/23 (!) 152/77   Wt Readings from Last 3 Encounters:  10/28/23 145 lb 9.6 oz (66 kg)  10/07/23 145 lb (65.8 kg)  09/01/23 145 lb (65.8 kg)    Physical Exam Vitals reviewed.  Constitutional:      Appearance: She is well-developed.  Eyes:     Conjunctiva/sclera: Conjunctivae normal.  Cardiovascular:     Rate and Rhythm: Normal rate and regular rhythm.     Pulses: Normal pulses.     Heart sounds: Normal heart sounds.  Pulmonary:     Effort: Pulmonary effort is normal.     Breath sounds: Normal breath sounds. No wheezing, rhonchi or rales.  Skin:    General: Skin is warm and dry.  Neurological:     Mental Status: She is alert.  Psychiatric:        Speech: Speech normal.        Behavior: Behavior normal.        Thought Content: Thought content normal.

## 2023-12-07 ENCOUNTER — Encounter: Payer: Self-pay | Admitting: Student in an Organized Health Care Education/Training Program

## 2023-12-07 ENCOUNTER — Ambulatory Visit
Attending: Student in an Organized Health Care Education/Training Program | Admitting: Student in an Organized Health Care Education/Training Program

## 2023-12-07 VITALS — BP 132/74 | HR 95 | Temp 97.7°F | Ht 64.0 in | Wt 145.0 lb

## 2023-12-07 DIAGNOSIS — M75102 Unspecified rotator cuff tear or rupture of left shoulder, not specified as traumatic: Secondary | ICD-10-CM | POA: Diagnosis not present

## 2023-12-07 DIAGNOSIS — Z9889 Other specified postprocedural states: Secondary | ICD-10-CM | POA: Diagnosis not present

## 2023-12-07 DIAGNOSIS — M12812 Other specific arthropathies, not elsewhere classified, left shoulder: Secondary | ICD-10-CM | POA: Diagnosis not present

## 2023-12-07 DIAGNOSIS — S4432XS Injury of axillary nerve, left arm, sequela: Secondary | ICD-10-CM | POA: Diagnosis not present

## 2023-12-07 DIAGNOSIS — M25512 Pain in left shoulder: Secondary | ICD-10-CM | POA: Insufficient documentation

## 2023-12-07 DIAGNOSIS — G8929 Other chronic pain: Secondary | ICD-10-CM | POA: Insufficient documentation

## 2023-12-07 NOTE — Progress Notes (Signed)
 Safety precautions to be maintained throughout the outpatient stay will include: orient to surroundings, keep bed in low position, maintain call bell within reach at all times, provide assistance with transfer out of bed and ambulation.

## 2023-12-07 NOTE — Progress Notes (Signed)
 PROVIDER NOTE: Interpretation of information contained herein should be left to medically-trained personnel. Specific patient instructions are provided elsewhere under Patient Instructions section of medical record. This document was created in part using AI and STT-dictation technology, any transcriptional errors that may result from this process are unintentional.  Patient: Melissa Garrison  Service: E/M   PCP: Calista Catching, FNP  DOB: 1960-11-08  DOS: 12/07/2023  Provider: Cephus Collin, MD  MRN: [id number]  Delivery: Face-to-face  Specialty: Interventional Pain Management  Type: Established Patient  Setting: Ambulatory outpatient facility  Specialty designation: 09  Referring Prov.: Calista Catching, FNP  Location: Outpatient office facility       History of present illness (HPI) Melissa Garrison, a 63 y.o. year old female, is here today because of her Injury of left axillary nerve, sequela [S44.32XS]. Melissa Garrison's primary complain today is Shoulder Pain (left)   Pain Assessment: Severity of Chronic pain is reported as a 5 /10. Location: Shoulder Left/Denies. Onset: More than a month ago. Quality: Aching, Constant. Timing: Constant. Modifying factor(s): Meds, stimlator. Vitals:  height is 5' 4 (1.626 m) and weight is 145 lb (65.8 kg). Her temperature is 97.7 F (36.5 C). Her blood pressure is 132/74 and her pulse is 95. Her oxygen saturation is 100%.  BMI: Estimated body mass index is 24.89 kg/m as calculated from the following:   Height as of this encounter: 5' 4 (1.626 m).   Weight as of this encounter: 145 lb (65.8 kg).  Last encounter: 09/01/2023. Last procedure: 10/07/2023.  Reason for encounter:  Post-Procedure Evaluation   Procedure: Axillary nerve block  #1  Laterality:  Left  Level: Approximately 6 cm inferior to the left acromion Imaging: Ultrasound-guided         Anesthesia: Local anesthesia (1-2% Lidocaine ) Sedation: Valium  5 mg PO DOS: 05/20/2023  Performed  by: Cephus Collin, MD  Purpose: Diagnostic/Therapeutic Indications: Shoulder pain, severe enough to impact quality of life and/or function. 1. Injury of left axillary nerve, sequela   2. Chronic left shoulder pain   3. Hx of shoulder surgery   4. Left rotator cuff tear arthropathy     NAS-11 score:   Pre-procedure: 7 /10   Post-procedure: 0-No pain/10       Effectiveness:  Initial hour after procedure: 100 %  Subsequent 4-6 hours post-procedure: 100 %  Analgesia past initial 6 hours: 80% Ongoing improvement:  Analgesic:  80% Function: Melissa Garrison reports improvement in function ROM: Melissa Garrison reports improvement in ROM   ROS  Constitutional: Denies any fever or chills Gastrointestinal: No reported hemesis, hematochezia, vomiting, or acute GI distress Musculoskeletal: Denies any acute onset joint swelling, redness, loss of ROM, or weakness Neurological: Improvement in left shoulder pain and left shoulder range of motion  Medication Review  Blood Glucose Monitoring Suppl, OneTouch Delica Lancets 33G, Semaglutide (0.25 or 0.5MG /DOS), amLODipine , cyclobenzaprine , freestyle, gabapentin , lisinopril , metFORMIN , rosuvastatin , and traMADol   History Review  Allergy: Melissa Garrison has no known allergies. Drug: Melissa Garrison  reports that she does not currently use drugs. Alcohol:  reports that she does not currently use alcohol after a past usage of about 3.0 standard drinks of alcohol per week. Tobacco:  reports that she has been smoking cigarettes. She has a 5 pack-year smoking history. She has never used smokeless tobacco. Social: Melissa Garrison  reports that she has been smoking cigarettes. She has a 5 pack-year smoking history. She has never used smokeless tobacco. She reports that she does not currently use alcohol after  a past usage of about 3.0 standard drinks of alcohol per week. She reports that she does not currently use drugs. Medical:  has a past medical history of Allergic  rhinitis (07/11/2011), Diabetes mellitus without complication (HCC), Hepatitis C, Hepatitis C, chronic (HCC) (07/11/2011), Hyperlipidemia, Hypertension (07/11/2011), IV drug abuse (HCC), Vitamin D  deficiency, and Wears dentures. Surgical: Melissa Garrison  has a past surgical history that includes Laparoscopic hysterectomy; Cholecystectomy (2011); Humerus surgery (Left); Hydradenitis excision (Right, 03/20/2017); Breast biopsy (Left, 12/05/2015); Colonoscopy (2017); Shoulder arthroscopy with subacromial decompression and open rotator cuff repair, open bicep tendon repair (Left, 01/04/2021); Capsular release (Left, 07/02/2021); Lysis of adhesion (Left, 07/02/2021); Closed manipulation shoulder with steroid injection (Left, 07/02/2021); Shoulder arthroscopy (Left, 07/02/2021); Colonoscopy with propofol  (N/A, 05/20/2022); and Closed manipulation shoulder with steroid injection (Left, 08/11/2022). Family: family history includes Diabetes in her father; Hypertension in her brother and mother.  Laboratory Chemistry Profile   Renal Lab Results  Component Value Date   BUN 11 08/24/2023   CREATININE 0.66 08/24/2023   GFR 95.10 08/24/2023   GFRAA >60 03/16/2017   GFRNONAA >60 07/14/2022    Hepatic Lab Results  Component Value Date   AST 17 08/24/2023   ALT 12 08/24/2023   ALBUMIN 4.6 08/24/2023   ALKPHOS 49 08/24/2023    Electrolytes Lab Results  Component Value Date   NA 137 08/24/2023   K 4.2 08/24/2023   CL 99 08/24/2023   CALCIUM  10.7 (H) 08/24/2023    Bone Lab Results  Component Value Date   VD25OH 13.67 (L) 10/18/2021    Inflammation (CRP: Acute Phase) (ESR: Chronic Phase) No results found for: CRP, ESRSEDRATE, LATICACIDVEN       Note: Above Lab results reviewed.  Recent Imaging Review  DG PAIN CLINIC C-ARM 1-60 MIN NO REPORT Fluoro was used, but no Radiologist interpretation will be provided.  Please refer to NOTES tab for provider progress note. Note: Reviewed         Physical Exam  General appearance: Well nourished, well developed, and well hydrated. In no apparent acute distress Mental status: Alert, oriented x 3 (person, place, & time)       Respiratory: No evidence of acute respiratory distress Eyes: PERLA Vitals: BP 132/74   Pulse 95   Temp 97.7 F (36.5 C)   Ht 5' 4 (1.626 m)   Wt 145 lb (65.8 kg)   SpO2 100%   BMI 24.89 kg/m  BMI: Estimated body mass index is 24.89 kg/m as calculated from the following:   Height as of this encounter: 5' 4 (1.626 m).   Weight as of this encounter: 145 lb (65.8 kg). Ideal: Ideal body weight: 54.7 kg (120 lb 9.5 oz) Adjusted ideal body weight: 59.1 kg (130 lb 5.7 oz)  Improvement in left shoulder pain and left shoulder range of motion Sprint peripheral nerve stimulator lead removed with tip intact  Assessment   Diagnosis  1. Injury of left axillary nerve, sequela   2. Chronic left shoulder pain   3. Hx of shoulder surgery   4. Left rotator cuff tear arthropathy      Updated Problems: No problems updated.  Plan of Care  Patient presents today for removal of her left Sprint peripheral nerve stimulator axillary lead.  She endorses significant pain relief with peripheral nerve stimulation rating it at approximately 80% pain relief for her left shoulder and improvement in her left shoulder range of motion.  She is inquiring about a permanent implant.  I discussed that with  her briefly.  I will see her back in approximately 4 weeks to assess her pain and at that time determine if she needs a referral for a left axillary peripheral nerve stimulator implant.  Patient endorsed understanding   Left SSNB 01/19/23, Left C3,4 MBNB, Left axillary nerve block X2; left peripheral axillary nerve stimulator 10/07/23, removed 12/07/2023: 80% pain relief    Return in about 4 weeks (around 01/04/2024).    Recent Visits Date Type Provider Dept  10/07/23 Procedure visit Cephus Collin, MD [facility]   Showing recent visits within past 90 days and meeting all other requirements Today's Visits Date Type Provider Dept  12/07/23 Office Visit Cephus Collin, MD [facility]  Showing today's visits and meeting all other requirements Future Appointments Date Type Provider Dept  01/05/24 Appointment Cephus Collin, MD [facility]  Showing future appointments within next 90 days and meeting all other requirements  I discussed the assessment and treatment plan with the patient. The patient was provided an opportunity to ask questions and all were answered. The patient agreed with the plan and demonstrated an understanding of the instructions.  Patient advised to call back or seek an in-person evaluation if the symptoms or condition worsens.  Duration of encounter: .  Total time on encounter, as per AMA guidelines included both the face-to-face and non-face-to-face time personally spent by the physician and/or other qualified health care professional(s) on the day of the encounter (includes time in activities that require the physician or other qualified health care professional and does not include time in activities normally performed by clinical staff). Physician's time may include the following activities when performed: Preparing to see the patient (e.g., pre-charting review of records, searching for previously ordered imaging, lab work, and nerve conduction tests) Review of prior analgesic pharmacotherapies. Reviewing PMP Interpreting ordered tests (e.g., lab work, imaging, nerve conduction tests) Performing post-procedure evaluations, including interpretation of diagnostic procedures Obtaining and/or reviewing separately obtained history Performing a medically appropriate examination and/or evaluation Counseling and educating the patient/family/caregiver Ordering medications, tests, or procedures Referring and communicating with other health care professionals  (when not separately reported) Documenting clinical information in the electronic or other health record Independently interpreting results (not separately reported) and communicating results to the patient/ family/caregiver Care coordination (not separately reported)  Note by: Cephus Collin, MD (TTS and AI technology used. I apologize for any typographical errors that were not detected and corrected.) Date: 12/07/2023; Time: 3:48 PM

## 2023-12-07 NOTE — Patient Instructions (Signed)

## 2024-01-05 ENCOUNTER — Encounter: Payer: Self-pay | Admitting: Student in an Organized Health Care Education/Training Program

## 2024-01-05 ENCOUNTER — Ambulatory Visit
Attending: Student in an Organized Health Care Education/Training Program | Admitting: Student in an Organized Health Care Education/Training Program

## 2024-01-05 VITALS — BP 117/76 | HR 87 | Temp 97.3°F | Resp 18 | Ht 64.0 in | Wt 145.0 lb

## 2024-01-05 DIAGNOSIS — M75102 Unspecified rotator cuff tear or rupture of left shoulder, not specified as traumatic: Secondary | ICD-10-CM | POA: Diagnosis not present

## 2024-01-05 DIAGNOSIS — M25512 Pain in left shoulder: Secondary | ICD-10-CM | POA: Insufficient documentation

## 2024-01-05 DIAGNOSIS — G8929 Other chronic pain: Secondary | ICD-10-CM | POA: Insufficient documentation

## 2024-01-05 DIAGNOSIS — Z9889 Other specified postprocedural states: Secondary | ICD-10-CM | POA: Insufficient documentation

## 2024-01-05 DIAGNOSIS — S4432XS Injury of axillary nerve, left arm, sequela: Secondary | ICD-10-CM | POA: Diagnosis not present

## 2024-01-05 DIAGNOSIS — M12812 Other specific arthropathies, not elsewhere classified, left shoulder: Secondary | ICD-10-CM | POA: Diagnosis not present

## 2024-01-05 NOTE — Progress Notes (Signed)
 PROVIDER NOTE: Interpretation of information contained herein should be left to medically-trained personnel. Specific patient instructions are provided elsewhere under Patient Instructions section of medical record. This document was created in part using AI and STT-dictation technology, any transcriptional errors that may result from this process are unintentional.  Patient: Melissa Garrison  Service: E/M   PCP: Dineen Rollene MATSU, FNP  DOB: [birth year]  DOS: 01/05/2024  Provider: Wallie Sherry, MD  MRN: [id number]  Delivery: Face-to-face  Specialty: Interventional Pain Management  Type: Established Patient  Setting: Ambulatory outpatient facility  Specialty designation: 09  Referring Prov.: Dineen Rollene MATSU, FNP  Location: Outpatient office facility       History of present illness (HPI) Ms. Melissa Garrison, a 63 y.o. year old female, is here today because of her Injury of left axillary nerve, sequela [S44.32XS]. Ms. Melissa Garrison's primary complain today is Shoulder Pain  Pertinent problems: Ms. Melissa Garrison does not have any pertinent problems on file.  Pain Assessment: Severity of Chronic pain is reported as a 9 /10. Location: Shoulder Left/Radiates up in to left side of neck and down left arm down to elbow.. Onset: More than a month ago. Quality: Aching, Constant. Timing: Constant. Modifying factor(s): Nothing. Vitals:  height is 5' 4 (1.626 m) and weight is 145 lb (65.8 kg). Her temperature is 97.3 F (36.3 C) (abnormal). Her blood pressure is 117/76 and her pulse is 87. Her respiration is 18 and oxygen saturation is 100%.  BMI: Estimated body mass index is 24.89 kg/m as calculated from the following:   Height as of this encounter: 5' 4 (1.626 m).   Weight as of this encounter: 145 lb (65.8 kg).  Last encounter: 12/07/2023. Last procedure: 10/07/2023.  Reason for encounter:   History of Present Illness   Melissa Garrison is a 63 year old female who presents with recurrence of left shoulder  pain after removal of Sprint peripheral nerve stimulator  Her pain returned to the same level as before following the removal of her left axillary peripheral nerve stimulator she began noticing the recurrence of pain approximately two weeks after the lead was removed. The pain was initially well-managed with the lead in place.  She has previously undergone a axillary peripheral nerve stimulator with Sprint for 60 days, which provided good but temporary relief. She has not undergone nerve ablation before.       ROS  Constitutional: Denies any fever or chills Gastrointestinal: No reported hemesis, hematochezia, vomiting, or acute GI distress Musculoskeletal: Left shoulder and axillary pain Neurological: No reported episodes of acute onset apraxia, aphasia, dysarthria, agnosia, amnesia, paralysis, loss of coordination, or loss of consciousness  Medication Review  Blood Glucose Monitoring Suppl, OneTouch Delica Lancets 33G, Semaglutide (0.25 or 0.5MG /DOS), amLODipine , cyclobenzaprine , freestyle, gabapentin , lisinopril , metFORMIN , rosuvastatin , and traMADol   History Review  Allergy: Ms. Melissa Garrison has no known allergies. Drug: Ms. Melissa Garrison  reports that she does not currently use drugs. Alcohol:  reports that she does not currently use alcohol after a past usage of about 3.0 standard drinks of alcohol per week. Tobacco:  reports that she has been smoking cigarettes. She has a 5 pack-year smoking history. She has never used smokeless tobacco. Social: Ms. Melissa Garrison  reports that she has been smoking cigarettes. She has a 5 pack-year smoking history. She has never used smokeless tobacco. She reports that she does not currently use alcohol after a past usage of about 3.0 standard drinks of alcohol per week. She reports that she does not currently use drugs. Medical:  has a past medical history of Allergic rhinitis (07/11/2011), Diabetes mellitus without complication (HCC), Hepatitis C, Hepatitis C, chronic  (HCC) (07/11/2011), Hyperlipidemia, Hypertension (07/11/2011), IV drug abuse (HCC), Vitamin D  deficiency, and Wears dentures. Surgical: Ms. Melissa Garrison  has a past surgical history that includes Laparoscopic hysterectomy; Cholecystectomy (2011); Humerus surgery (Left); Hydradenitis excision (Right, 03/20/2017); Breast biopsy (Left, 12/05/2015); Colonoscopy (2017); Shoulder arthroscopy with subacromial decompression and open rotator cuff repair, open bicep tendon repair (Left, 01/04/2021); Capsular release (Left, 07/02/2021); Lysis of adhesion (Left, 07/02/2021); Closed manipulation shoulder with steroid injection (Left, 07/02/2021); Shoulder arthroscopy (Left, 07/02/2021); Colonoscopy with propofol  (N/A, 05/20/2022); and Closed manipulation shoulder with steroid injection (Left, 08/11/2022). Family: family history includes Diabetes in her father; Hypertension in her brother and mother.  Laboratory Chemistry Profile   Renal Lab Results  Component Value Date   BUN 11 08/24/2023   CREATININE 0.66 08/24/2023   GFR 95.10 08/24/2023   GFRAA >60 03/16/2017   GFRNONAA >60 07/14/2022    Hepatic Lab Results  Component Value Date   AST 17 08/24/2023   ALT 12 08/24/2023   ALBUMIN 4.6 08/24/2023   ALKPHOS 49 08/24/2023    Electrolytes Lab Results  Component Value Date   NA 137 08/24/2023   K 4.2 08/24/2023   CL 99 08/24/2023   CALCIUM  10.7 (H) 08/24/2023    Bone Lab Results  Component Value Date   VD25OH 13.67 (L) 10/18/2021    Inflammation (CRP: Acute Phase) (ESR: Chronic Phase) No results found for: CRP, ESRSEDRATE, LATICACIDVEN       Note: Above Lab results reviewed.  Recent Imaging Review  DG PAIN CLINIC C-ARM 1-60 MIN NO REPORT Fluoro was used, but no Radiologist interpretation will be provided.  Please refer to NOTES tab for provider progress note. Note: Reviewed        Physical Exam  Vitals: BP 117/76 (BP Location: Right Arm, Patient Position: Sitting, Cuff Size: Small)    Pulse 87   Temp (!) 97.3 F (36.3 C)   Resp 18   Ht 5' 4 (1.626 m)   Wt 145 lb (65.8 kg)   SpO2 100%   BMI 24.89 kg/m  BMI: Estimated body mass index is 24.89 kg/m as calculated from the following:   Height as of this encounter: 5' 4 (1.626 m).   Weight as of this encounter: 145 lb (65.8 kg). Ideal: Ideal body weight: 54.7 kg (120 lb 9.5 oz) Adjusted ideal body weight: 59.1 kg (130 lb 5.7 oz) General appearance: Well nourished, well developed, and well hydrated. In no apparent acute distress Mental status: Alert, oriented x 3 (person, place, & time)       Respiratory: No evidence of acute respiratory distress Eyes: PERLA  Left shoulder pain, axillary pain, worse with shoulder abduction  Assessment   Diagnosis Status  1. Injury of left axillary nerve, sequela   2. Chronic left shoulder pain   3. Hx of shoulder surgery   4. Left rotator cuff tear arthropathy    Persistent Persistent Controlled   Updated Problems: No problems updated.  Plan of Care  Discussed options for left shoulder pain management due to left axillary neuralgia in the context of having 3 left prior shoulder surgeries.  She is status post Sprint peripheral nerve stimulation of the left axillary nerve that provided her with significant pain relief during the time the stimulator was replaced but after removal, she unfortunately has had return of her left shoulder pain.  We discussed next options which could include radiofrequency  ablation of the left axillary nerve, repeat Sprint PNS, peripheral nerve stimulator implant.  She would like to proceed with RFA of the left axillary nerve first.  Risk and benefits reviewed and patient would like to proceed.  Pharmacotherapy (Medications Ordered): No orders of the defined types were placed in this encounter.  Orders:  Orders Placed This Encounter  Procedures   Radiofrequency ablation, other    Standing Status:   Future    Expected Date:   01/25/2024     Expiration Date:   04/24/2024    Scheduling Instructions:     Side(s): LEFT axillary PULSED RFA     Sedation: PO Vallium 5 mg     Scheduling Timeframe: As soon as pre-approved    Where will this procedure be performed?:   [facility]     Left SSNB 01/19/23, Left C3,4 MBNB, Left axillary nerve block X2; left peripheral axillary nerve stimulator 10/07/23, removed 12/07/2023: 80% pain relief     Return for Left axillary PNS with PO Valium .    Recent Visits Date Type Provider Dept  12/07/23 Office Visit Marcelino Nurse, MD [facility]  10/07/23 Procedure visit Marcelino Nurse, MD [facility]  Showing recent visits within past 90 days and meeting all other requirements Today's Visits Date Type Provider Dept  01/05/24 Office Visit Marcelino Nurse, MD [facility]  Showing today's visits and meeting all other requirements Future Appointments No visits were found meeting these conditions. Showing future appointments within next 90 days and meeting all other requirements  I discussed the assessment and treatment plan with the patient. The patient was provided an opportunity to ask questions and all were answered. The patient agreed with the plan and demonstrated an understanding of the instructions.  Patient advised to call back or seek an in-person evaluation if the symptoms or condition worsens.  Duration of encounter: .  Total time on encounter, as per AMA guidelines included both the face-to-face and non-face-to-face time personally spent by the physician and/or other qualified health care professional(s) on the day of the encounter (includes time in activities that require the physician or other qualified health care professional and does not include time in activities normally performed by clinical staff). Physician's time may include the following activities when performed: Preparing to see the patient (e.g., pre-charting review of records,  searching for previously ordered imaging, lab work, and nerve conduction tests) Review of prior analgesic pharmacotherapies. Reviewing PMP Interpreting ordered tests (e.g., lab work, imaging, nerve conduction tests) Performing post-procedure evaluations, including interpretation of diagnostic procedures Obtaining and/or reviewing separately obtained history Performing a medically appropriate examination and/or evaluation Counseling and educating the patient/family/caregiver Ordering medications, tests, or procedures Referring and communicating with other health care professionals (when not separately reported) Documenting clinical information in the electronic or other health record Independently interpreting results (not separately reported) and communicating results to the patient/ family/caregiver Care coordination (not separately reported)  Note by: Nurse Marcelino, MD (TTS and AI technology used. I apologize for any typographical errors that were not detected and corrected.) Date: 01/05/2024; Time: 11:27 AM

## 2024-01-05 NOTE — Patient Instructions (Signed)

## 2024-01-05 NOTE — Progress Notes (Signed)
 Safety precautions to be maintained throughout the outpatient stay will include: orient to surroundings, keep bed in low position, maintain call bell within reach at all times, provide assistance with transfer out of bed and ambulation.

## 2024-01-27 ENCOUNTER — Ambulatory Visit
Attending: Student in an Organized Health Care Education/Training Program | Admitting: Student in an Organized Health Care Education/Training Program

## 2024-01-27 ENCOUNTER — Encounter: Payer: Self-pay | Admitting: Student in an Organized Health Care Education/Training Program

## 2024-01-27 VITALS — BP 93/79 | HR 81 | Temp 97.2°F | Resp 23 | Ht 64.0 in | Wt 145.0 lb

## 2024-01-27 DIAGNOSIS — M25512 Pain in left shoulder: Secondary | ICD-10-CM | POA: Insufficient documentation

## 2024-01-27 DIAGNOSIS — S4432XS Injury of axillary nerve, left arm, sequela: Secondary | ICD-10-CM | POA: Insufficient documentation

## 2024-01-27 DIAGNOSIS — M75102 Unspecified rotator cuff tear or rupture of left shoulder, not specified as traumatic: Secondary | ICD-10-CM | POA: Insufficient documentation

## 2024-01-27 DIAGNOSIS — M12812 Other specific arthropathies, not elsewhere classified, left shoulder: Secondary | ICD-10-CM | POA: Diagnosis not present

## 2024-01-27 DIAGNOSIS — G8929 Other chronic pain: Secondary | ICD-10-CM | POA: Diagnosis not present

## 2024-01-27 MED ORDER — ROPIVACAINE HCL 2 MG/ML IJ SOLN
9.0000 mL | Freq: Once | INTRAMUSCULAR | Status: AC
  Administered 2024-01-27: 9 mL via PERINEURAL
  Filled 2024-01-27: qty 20

## 2024-01-27 MED ORDER — LIDOCAINE HCL 2 % IJ SOLN
20.0000 mL | Freq: Once | INTRAMUSCULAR | Status: AC
  Administered 2024-01-27: 200 mg
  Filled 2024-01-27: qty 40

## 2024-01-27 MED ORDER — DEXAMETHASONE SODIUM PHOSPHATE 10 MG/ML IJ SOLN
10.0000 mg | Freq: Once | INTRAMUSCULAR | Status: AC
  Administered 2024-01-27: 10 mg
  Filled 2024-01-27: qty 1

## 2024-01-27 MED ORDER — DIAZEPAM 5 MG PO TABS
ORAL_TABLET | ORAL | Status: AC
  Filled 2024-01-27: qty 1

## 2024-01-27 NOTE — Progress Notes (Signed)
 Safety precautions to be maintained throughout the outpatient stay will include: orient to surroundings, keep bed in low position, maintain call bell within reach at all times, provide assistance with transfer out of bed and ambulation.

## 2024-01-27 NOTE — Patient Instructions (Signed)

## 2024-01-27 NOTE — Progress Notes (Signed)
 PROVIDER NOTE: Interpretation of information contained herein should be left to medically-trained personnel. Specific patient instructions are provided elsewhere under Patient Instructions section of medical record. This document was created in part using STT-dictation technology, any transcriptional errors that may result from this process are unintentional.  Patient: Melissa Garrison Type: Established DOB: Feb 24, 1961 MRN: [id number] PCP: Dineen Rollene MATSU, FNP  Service: Procedure DOS: 01/27/2024 Setting: Ambulatory Location: Ambulatory outpatient facility Delivery: Face-to-face Provider: Wallie Sherry, MD Specialty: Interventional Pain Management Specialty designation: 09 Location: Outpatient facility Ref. Prov.: Dineen Rollene MATSU, FNP       Interventional Therapy  Procedure: Ultrasound-Guided Pulsed Radiofrequency Ablation of the Left Axillary Nerve Laterality:  Left  Level: Approximately 6 cm inferior to the left acromion Imaging: Ultrasound-guided         Anesthesia: Local anesthesia (1-2% Lidocaine ) Sedation: Valium  5 mg PO DOS: 01/27/2024  Performed by: Wallie Sherry, MD  Purpose: Therapeutic Indications: Shoulder pain, severe enough to impact quality of life and/or function. 1. Injury of left axillary nerve, sequela   2. Chronic left shoulder pain   3. Left rotator cuff tear arthropathy     NAS-11 score:   Pre-procedure: 10-Worst pain ever/10   Post-procedure: 0-No pain/10     Target: Axillary nerve, terminal branches Location: Inferior to acromion Region: left deltoid Approach: Percutaneous  Neuroanatomy: Target region is a middle of the deltoid muscle inferior to acromion   Position / Prep / Materials:  Position: Prone Materials:  Tray: Block Needle(s):  Type: Spinal  Gauge (G): 22  Length: 3.5 in.  Qty: 1 Prep solution: DuraPrep (Iodine Povacrylex [0.7% available iodine] and Isopropyl Alcohol, 74% w/w) Prep Area: Entire posterior shoulder area. From  upper spine to shoulder proper (upper arm), and from lateral neck to lower tip of shoulder blade.   H&P (Pre-op Assessment):  Melissa Garrison is a 63 y.o. (year old), female patient, seen today for interventional treatment. She  has a past surgical history that includes Laparoscopic hysterectomy; Cholecystectomy (2011); Humerus surgery (Left); Hydradenitis excision (Right, 03/20/2017); Breast biopsy (Left, 12/05/2015); Colonoscopy (2017); Shoulder arthroscopy with subacromial decompression and open rotator cuff repair, open bicep tendon repair (Left, 01/04/2021); Capsular release (Left, 07/02/2021); Lysis of adhesion (Left, 07/02/2021); Closed manipulation shoulder with steroid injection (Left, 07/02/2021); Shoulder arthroscopy (Left, 07/02/2021); Colonoscopy with propofol  (N/A, 05/20/2022); and Closed manipulation shoulder with steroid injection (Left, 08/11/2022). Melissa Garrison has a current medication list which includes the following prescription(s): amlodipine , blood glucose monitoring suppl, cyclobenzaprine , gabapentin , freestyle, lisinopril , metformin , onetouch delica lancets 33g, rosuvastatin , semaglutide (0.25 or 0.5mg /dos), and tramadol . Her primarily concern today is the Shoulder Pain (left)  Initial Vital Signs:  Pulse/HCG Rate: 81ECG Heart Rate: 88 Temp: (!) 97.2 F (36.2 C) Resp: 18 BP: 119/66 SpO2: 100 %  BMI: Estimated body mass index is 24.89 kg/m as calculated from the following:   Height as of this encounter: 5' 4 (1.626 m).   Weight as of this encounter: 145 lb (65.8 kg).  Risk Assessment: Allergies: Reviewed. She has no known allergies.  Allergy Precautions: None required Coagulopathies: Reviewed. None identified.  Blood-thinner therapy: None at this time Active Infection(s): Reviewed. None identified. Melissa Garrison is afebrile  Site Confirmation: Melissa Garrison was asked to confirm the procedure and laterality before marking the site Procedure checklist: Completed Consent: Before  the procedure and under the influence of no sedative(s), amnesic(s), or anxiolytics, the patient was informed of the treatment options, risks and possible complications. To fulfill our ethical and legal obligations, as recommended by the American  Medical Association's Code of Ethics, I have informed the patient of my clinical impression; the nature and purpose of the treatment or procedure; the risks, benefits, and possible complications of the intervention; the alternatives, including doing nothing; the risk(s) and benefit(s) of the alternative treatment(s) or procedure(s); and the risk(s) and benefit(s) of doing nothing. The patient was provided information about the general risks and possible complications associated with the procedure. These may include, but are not limited to: failure to achieve desired goals, infection, bleeding, organ or nerve damage, allergic reactions, paralysis, and death. In addition, the patient was informed of those risks and complications associated to the procedure, such as failure to decrease pain; infection; bleeding; organ or nerve damage with subsequent damage to sensory, motor, and/or autonomic systems, resulting in permanent pain, numbness, and/or weakness of one or several areas of the body; allergic reactions; (i.e.: anaphylactic reaction); and/or death. Furthermore, the patient was informed of those risks and complications associated with the medications. These include, but are not limited to: allergic reactions (i.e.: anaphylactic or anaphylactoid reaction(s)); adrenal axis suppression; blood sugar elevation that in diabetics may result in ketoacidosis or comma; water retention that in patients with history of congestive heart failure may result in shortness of breath, pulmonary edema, and decompensation with resultant heart failure; weight gain; swelling or edema; medication-induced neural toxicity; particulate matter embolism and blood vessel occlusion with resultant  organ, and/or nervous system infarction; and/or aseptic necrosis of one or more joints. Finally, the patient was informed that Medicine is not an exact science; therefore, there is also the possibility of unforeseen or unpredictable risks and/or possible complications that may result in a catastrophic outcome. The patient indicated having understood very clearly. We have given the patient no guarantees and we have made no promises. Enough time was given to the patient to ask questions, all of which were answered to the patient's satisfaction. Ms. Scheurich has indicated that she wanted to continue with the procedure. Attestation: I, the ordering provider, attest that I have discussed with the patient the benefits, risks, side-effects, alternatives, likelihood of achieving goals, and potential problems during recovery for the procedure that I have provided informed consent. Date  Time: 01/27/2024  9:34 AM  Pre-Procedure Preparation:  Monitoring: As per clinic protocol. Respiration, ETCO2, SpO2, BP, heart rate and rhythm monitor placed and checked for adequate function Safety Precautions: Patient was assessed for positional comfort and pressure points before starting the procedure. Time-out: I initiated and conducted the Time-out before starting the procedure, as per protocol. The patient was asked to participate by confirming the accuracy of the Time Out information. Verification of the correct person, site, and procedure were performed and confirmed by me, the nursing staff, and the patient. Time-out conducted as per Joint Commission's Universal Protocol (UP.01.01.01). Time: 1020 Start Time: 1020 hrs.  Description of Procedure:          Indication: Left shoulder pain secondary to axillary nerve entrapment/neuralgia; refractory to conservative management including medications and physical therapy.   Technique:  The patient was placed in the sitting position with the left arm on the side The left  anterior shoulder area was prepped and draped in a sterile fashion using chlorhexidine . Using a high-frequency linear ultrasound probe, the left axillary nerve was identified inferior to the deltoid head  Color Doppler was utilized to avoid vascular structures.  A 22-gauge, 100 mm RF cannula with a 5 mm active tip was inserted under continuous ultrasound guidance   Appropriate positioning of the RF  needle tip adjacent to the axillary nerve was confirmed  Stimulation and Treatment:  Sensory stimulation at 50 Hz produced concordant paresthesia in the left lateral region at < 0.5 volts.  Motor stimulation at 2 Hz did not elicit any unintended motor response (e.g., deltoid contraction).  After negative aspiration, pulsed radiofrequency was applied at 42C for 120 seconds, repeated for 2 cycles.  After lesioning, 5 cc solution made of 4 cc of 0.2% ropivacaine , 1 cc of Decadron  10 mg/cc was injected.   Needle was then withdrawn    Vitals:   01/27/24 1018 01/27/24 1021 01/27/24 1026 01/27/24 1030  BP: 109/76 110/64 135/77 93/79  Pulse:      Resp: 20 (!) 26 (!) 32 (!) 23  Temp:      SpO2: 100% 100% 100% 97%  Weight:      Height:         Start Time: 1020 hrs. End Time: 1029 hrs.  Imaging Guidance (Spinal):          Type of Imaging Technique: US  guidance  Indication(s): Assistance in needle guidance and placement for procedures requiring needle placement in or near specific anatomical locations not easily accessible without such assistance.   Antibiotic Prophylaxis:   Anti-infectives (From admission, onward)    None      Indication(s): None identified  Post-operative Assessment:  Post-procedure Vital Signs:  Pulse/HCG Rate: 8186 Temp: (!) 97.2 F (36.2 C) Resp: (!) 23 BP: 93/79 SpO2: 97 %  EBL: None  Complications: No immediate post-treatment complications observed by team, or reported by patient.  Note: The patient tolerated the entire procedure well. A repeat  set of vitals were taken after the procedure and the patient was kept under observation following institutional policy, for this type of procedure. Post-procedural neurological assessment was performed, showing return to baseline, prior to discharge. The patient was provided with post-procedure discharge instructions, including a section on how to identify potential problems. Should any problems arise concerning this procedure, the patient was given instructions to immediately contact us , at any time, without hesitation. In any case, we plan to contact the patient by telephone for a follow-up status report regarding this interventional procedure.  Comments:  No additional relevant information.  Plan of Care (POC)  Orders:  No orders of the defined types were placed in this encounter.    Medications ordered for procedure: Meds ordered this encounter  Medications   lidocaine  (XYLOCAINE ) 2 % (with pres) injection 400 mg   ropivacaine  (PF) 2 mg/mL (0.2%) (NAROPIN ) injection 9 mL   dexamethasone  (DECADRON ) injection 10 mg   Medications administered: We administered lidocaine , ropivacaine  (PF) 2 mg/mL (0.2%), and dexamethasone .  See the medical record for exact dosing, route, and time of administration.  Follow-up plan:   Return in about 6 weeks (around 03/09/2024), or PPE F2F.      Recent Visits Date Type Provider Dept  01/05/24 Office Visit Marcelino Nurse, MD [facility]  12/07/23 Office Visit Marcelino Nurse, MD [facility]  Showing recent visits within past 90 days and meeting all other requirements Today's Visits Date Type Provider Dept  01/27/24 Procedure visit Marcelino Nurse, MD [facility]  Showing today's visits and meeting all other requirements Future Appointments Date Type Provider Dept  03/08/24 Appointment Marcelino Nurse, MD [facility]  Showing future appointments within next 90 days and meeting all other requirements  Disposition:  Discharge home  Discharge (Date  Time): 01/27/2024; 1040 hrs.   Primary Care Physician: Dineen,  Rollene MATSU, FNP Location: [facility] Note by: Wallie Sherry, MD (TTS technology used. I apologize for any typographical errors that were not detected and corrected.) Date: 01/27/2024; Time: 11:48 AM  Disclaimer:  Medicine is not an Visual merchandiser. The only guarantee in medicine is that nothing is guaranteed. It is important to note that the decision to proceed with this intervention was based on the information collected from the patient. The Data and conclusions were drawn from the patient's questionnaire, the interview, and the physical examination. Because the information was provided in large part by the patient, it cannot be guaranteed that it has not been purposely or unconsciously manipulated. Every effort has been made to obtain as much relevant data as possible for this evaluation. It is important to note that the conclusions that lead to this procedure are derived in large part from the available data. Always take into account that the treatment will also be dependent on availability of resources and existing treatment guidelines, considered by other Pain Management Practitioners as being common knowledge and practice, at the time of the intervention. For Medico-Legal purposes, it is also important to point out that variation in procedural techniques and pharmacological choices are the acceptable norm. The indications, contraindications, technique, and results of the above procedure should only be interpreted and judged by a Board-Certified Interventional Pain Specialist with extensive familiarity and expertise in the same exact procedure and technique.

## 2024-01-28 ENCOUNTER — Encounter: Payer: Self-pay | Admitting: Family

## 2024-01-28 ENCOUNTER — Ambulatory Visit: Admitting: Family

## 2024-01-28 ENCOUNTER — Telehealth: Payer: Self-pay

## 2024-01-28 VITALS — BP 128/64 | HR 88 | Temp 98.1°F | Ht 64.0 in | Wt 140.4 lb

## 2024-01-28 DIAGNOSIS — M542 Cervicalgia: Secondary | ICD-10-CM

## 2024-01-28 DIAGNOSIS — I10 Essential (primary) hypertension: Secondary | ICD-10-CM | POA: Diagnosis not present

## 2024-01-28 DIAGNOSIS — M25512 Pain in left shoulder: Secondary | ICD-10-CM

## 2024-01-28 DIAGNOSIS — E119 Type 2 diabetes mellitus without complications: Secondary | ICD-10-CM

## 2024-01-28 DIAGNOSIS — G8929 Other chronic pain: Secondary | ICD-10-CM

## 2024-01-28 DIAGNOSIS — Z7985 Long-term (current) use of injectable non-insulin antidiabetic drugs: Secondary | ICD-10-CM | POA: Diagnosis not present

## 2024-01-28 DIAGNOSIS — Z7984 Long term (current) use of oral hypoglycemic drugs: Secondary | ICD-10-CM

## 2024-01-28 DIAGNOSIS — R7309 Other abnormal glucose: Secondary | ICD-10-CM

## 2024-01-28 LAB — POCT GLYCOSYLATED HEMOGLOBIN (HGB A1C): Hemoglobin A1C: 6.3 % — AB (ref 4.0–5.6)

## 2024-01-28 MED ORDER — TRAMADOL HCL 50 MG PO TABS: 50.0000 mg | ORAL_TABLET | Freq: Three times a day (TID) | ORAL | 2 refills | Status: DC | PRN

## 2024-01-28 MED ORDER — MELOXICAM 7.5 MG PO TABS
7.5000 mg | ORAL_TABLET | Freq: Every day | ORAL | 1 refills | Status: AC | PRN
Start: 2024-01-28 — End: 2024-02-07

## 2024-01-28 NOTE — Assessment & Plan Note (Signed)
 Chronic left shoulder pain continues post-ablation and is inadequately managed with current medication. Increase tramadol  to 50 mg three times daily as needed and start meloxicam  (Mobic ) for 7-10 days. Continue gabapentin  100 mg three times daily. Reassess pain management in follow-up. Counseled on safe storage of tramadol .

## 2024-01-28 NOTE — Telephone Encounter (Signed)
 Post procedure follow up.  Patient states she is doing good.

## 2024-01-28 NOTE — Progress Notes (Signed)
 Assessment & Plan:  Chronic left shoulder pain Assessment & Plan: Chronic left shoulder pain continues post-ablation and is inadequately managed with current medication. Increase tramadol  to 50 mg three times daily as needed and start meloxicam  (Mobic ) for 7-10 days. Continue gabapentin  100 mg three times daily. Reassess pain management in follow-up. Counseled on safe storage of tramadol .  Orders: -     Meloxicam ; Take 1 tablet (7.5 mg total) by mouth daily as needed for up to 10 days for pain.  Dispense: 30 tablet; Refill: 1 -     traMADol  HCl; Take 1 tablet (50 mg total) by mouth 3 (three) times daily as needed.  Dispense: 90 tablet; Refill: 2  Elevated glucose -     POCT glycosylated hemoglobin (Hb A1C)  Neck pain -     traMADol  HCl; Take 1 tablet (50 mg total) by mouth 3 (three) times daily as needed.  Dispense: 90 tablet; Refill: 2  Diabetes mellitus without complication (HCC) Assessment & Plan: Type 2 diabetes mellitus shows significant improvement in glycemic control with current A1c at 6.3%. Continue metformin  1000 mg twice daily and Ozempic  (semaglutide ) 0.5 mg. Maintain current lifestyle and dietary habits.   Primary hypertension Assessment & Plan: Hypertension is well-controlled with the current regimen. Continue lisinopril  40 mg daily and amlodipine  5 mg daily.      Return precautions given.   Risks, benefits, and alternatives of the medications and treatment plan prescribed today were discussed, and patient expressed understanding.   Education regarding symptom management and diagnosis given to patient on AVS either electronically or printed.  No follow-ups on file.  Melissa Northern, FNP  Subjective:    Patient ID: Melissa Garrison, female    DOB: 1961-05-14, 63 y.o.   MRN: [id number]  CC: Melissa Garrison is a 63 y.o. female who presents today for follow up.   HPI: HPI Discussed the use of AI scribe software for clinical note transcription with the patient,  who gave verbal consent to proceed.  History of Present Illness   Melissa Garrison is a 63 year old female with diabetes who presents for a diabetic follow-up.  She manages her diabetes with metformin  1000 mg twice daily and Ozempic  0.5 mg, both of which she tolerates well without gastrointestinal side effects. Her hemoglobin A1c has improved to 6.3% from 6.6% five months ago, and a high of 9.7% last August. She has lost four pounds since her last visit.  She underwent a procedure yesterday involving nerve burning for her left shoulder pain. The pain is described as an aching with a 'thump' sensation, localized to a specific area around the left upper arm. Denies tingling or burning sensations. She is currently taking tramadol  50 mg twice daily and gabapentin  100 mg three times daily for pain management, but the tramadol  is not sufficiently alleviating her pain. She is not taking any NSAIDs or muscle relaxants and is cautious about taking medications that cause sedation.  No diarrhea, constipation, or nausea. Normal bowel movements.        Dr Marcelino s/p nerve block 01/27/24  Allergies: Patient has no known allergies. Current Outpatient Medications on File Prior to Visit  Medication Sig Dispense Refill   amLODipine  (NORVASC ) 5 MG tablet Take 1 tablet (5 mg total) by mouth daily. 90 tablet 3   Blood Glucose Monitoring Suppl DEVI 1 each by Does not apply route in the morning, at noon, and at bedtime. May substitute to any manufacturer covered by patient's insurance. 1 each 0  cyclobenzaprine  (FLEXERIL ) 5 MG tablet Take 1-2 tablets (5-10 mg total) by mouth 3 (three) times daily as needed for muscle spasms. 90 tablet 1   gabapentin  (NEURONTIN ) 100 MG capsule Take 1 capsule (100 mg total) by mouth 3 (three) times daily. 90 capsule 3   Lancets (FREESTYLE) lancets Use as instructed 100 each 12   lisinopril  (ZESTRIL ) 40 MG tablet Take 1 tablet (40 mg total) by mouth daily. 90 tablet 3   metFORMIN   (GLUCOPHAGE -XR) 500 MG 24 hr tablet Take 2 tablets (1,000 mg total) by mouth 2 (two) times daily with a meal. 360 tablet 3   OneTouch Delica Lancets 33G MISC Used to check blood sugar once daily. 200 each 3   rosuvastatin  (CRESTOR ) 10 MG tablet Take 1 tablet daily three days per week 48 tablet 2   Semaglutide ,0.25 or 0.5MG /DOS, 2 MG/3ML SOPN Inject 0.5 mg into the skin once a week. 3 mL 2   No current facility-administered medications on file prior to visit.    Review of Systems  Constitutional:  Negative for chills and fever.  Respiratory:  Negative for cough.   Cardiovascular:  Negative for chest pain and palpitations.  Gastrointestinal:  Negative for nausea and vomiting.  Musculoskeletal:  Positive for arthralgias (left shoulder). Negative for neck pain.  Neurological:  Negative for numbness.      Objective:    BP 128/64 (BP Location: Left Arm, Patient Position: Sitting, Cuff Size: Normal)   Pulse 88   Temp 98.1 F (36.7 C) (Oral)   Ht 5' 4 (1.626 m)   Wt 140 lb 6.4 oz (63.7 kg)   SpO2 98%   BMI 24.10 kg/m  BP Readings from Last 3 Encounters:  01/28/24 128/64  01/27/24 93/79  01/05/24 117/76   Wt Readings from Last 3 Encounters:  01/28/24 140 lb 6.4 oz (63.7 kg)  01/27/24 145 lb (65.8 kg)  01/05/24 145 lb (65.8 kg)    Physical Exam Vitals reviewed.  Constitutional:      Appearance: She is well-developed.  Eyes:     Conjunctiva/sclera: Conjunctivae normal.  Cardiovascular:     Rate and Rhythm: Normal rate and regular rhythm.     Pulses: Normal pulses.     Heart sounds: Normal heart sounds.  Pulmonary:     Effort: Pulmonary effort is normal.     Breath sounds: Normal breath sounds. No wheezing, rhonchi or rales.  Skin:    General: Skin is warm and dry.  Neurological:     Mental Status: She is alert.  Psychiatric:        Speech: Speech normal.        Behavior: Behavior normal.        Thought Content: Thought content normal.

## 2024-01-28 NOTE — Patient Instructions (Addendum)
 You are due for pneumonia vaccine, PCV20 and shingles vaccine , Shingrex.  Please check and see if you are taking gabapentin  or flexeril   Increase tramadol  to 50mg  three times daily if needed  Start mobic  for 10 days WITH food.   A couple of points in regards to meloxicam  ( Mobic ) -  This medication is not intended for daily , long term use. It is a potent anti inflammatory ( NSAID), and my intention is for you take as needed for moderate to severe pain. If you find yourself using daily, please let me know.   Please takes Mobic  ( meloxicam ) with FOOD since it is an anti-inflammatory as it can cause a GI bleed or ulcer. If you have a history of GI bleed or ulcer, please do NOT take.  Do no take over the counter aleve, motrin , advil , goody's powder for pain as they are also NSAIDs, and they are  in the same class as Mobic   Lastly, we will need to monitor kidney function while on Mobic , and if we were to see any decline in kidney function in the future, we would have to discontinue this medication.

## 2024-01-28 NOTE — Assessment & Plan Note (Signed)
 Type 2 diabetes mellitus shows significant improvement in glycemic control with current A1c at 6.3%. Continue metformin  1000 mg twice daily and Ozempic  (semaglutide ) 0.5 mg. Maintain current lifestyle and dietary habits.

## 2024-01-28 NOTE — Assessment & Plan Note (Signed)
 Hypertension is well-controlled with the current regimen. Continue lisinopril  40 mg daily and amlodipine  5 mg daily.

## 2024-01-29 ENCOUNTER — Telehealth: Payer: Self-pay | Admitting: Pharmacist

## 2024-01-29 NOTE — Telephone Encounter (Signed)
 Pharmacy Patient Advocate Encounter   Received notification from CoverMyMeds that prior authorization for Ozempic  (0.25 or 0.5 MG/DOSE) 2MG /3ML pen-injectors is due for renewal.   Insurance verification completed.   The patient is insured through [facility].  PA Approved from 10/19/2023 - 06/22/2024  ([id number])

## 2024-02-04 ENCOUNTER — Other Ambulatory Visit: Payer: Self-pay | Admitting: Family

## 2024-02-04 ENCOUNTER — Ambulatory Visit
Admission: RE | Admit: 2024-02-04 | Discharge: 2024-02-04 | Disposition: A | Discharge status: Home / Self Care | Source: Ambulatory Visit | Attending: Family | Admitting: Family

## 2024-02-04 DIAGNOSIS — E119 Type 2 diabetes mellitus without complications: Secondary | ICD-10-CM

## 2024-02-04 DIAGNOSIS — Z1231 Encounter for screening mammogram for malignant neoplasm of breast: Secondary | ICD-10-CM | POA: Insufficient documentation

## 2024-03-03 ENCOUNTER — Other Ambulatory Visit: Payer: Self-pay | Admitting: Family

## 2024-03-03 DIAGNOSIS — E119 Type 2 diabetes mellitus without complications: Secondary | ICD-10-CM

## 2024-03-08 ENCOUNTER — Ambulatory Visit
Attending: Student in an Organized Health Care Education/Training Program | Admitting: Student in an Organized Health Care Education/Training Program

## 2024-03-08 ENCOUNTER — Encounter: Payer: Self-pay | Admitting: Student in an Organized Health Care Education/Training Program

## 2024-03-08 VITALS — BP 143/71 | HR 102 | Temp 97.3°F | Ht 64.0 in | Wt 140.0 lb

## 2024-03-08 DIAGNOSIS — M75102 Unspecified rotator cuff tear or rupture of left shoulder, not specified as traumatic: Secondary | ICD-10-CM | POA: Insufficient documentation

## 2024-03-08 DIAGNOSIS — M25512 Pain in left shoulder: Secondary | ICD-10-CM | POA: Diagnosis not present

## 2024-03-08 DIAGNOSIS — M12812 Other specific arthropathies, not elsewhere classified, left shoulder: Secondary | ICD-10-CM | POA: Diagnosis not present

## 2024-03-08 DIAGNOSIS — G8929 Other chronic pain: Secondary | ICD-10-CM | POA: Diagnosis not present

## 2024-03-08 DIAGNOSIS — S4432XS Injury of axillary nerve, left arm, sequela: Secondary | ICD-10-CM | POA: Insufficient documentation

## 2024-03-08 NOTE — Progress Notes (Signed)
 PROVIDER NOTE: Interpretation of information contained herein should be left to medically-trained personnel. Specific patient instructions are provided elsewhere under Patient Instructions section of medical record. This document was created in part using AI and STT-dictation technology, any transcriptional errors that may result from this process are unintentional.  Patient: Melissa Garrison  Service: E/M   PCP: Dineen Rollene MATSU, FNP  DOB: Oct 02, 1960  DOS: 03/08/2024  Provider: Wallie Sherry, MD  MRN: [id number]  Delivery: Face-to-face  Specialty: Interventional Pain Management  Type: Established Patient  Setting: Ambulatory outpatient facility  Specialty designation: 09  Referring Prov.: Dineen Rollene MATSU, FNP  Location: Outpatient office facility       History of present illness (HPI) Melissa Garrison, a 63 y.o. year old female, is here today because of her Injury of left axillary nerve, sequela [S44.32XS]. [name]'s primary complain today is Shoulder Pain (left)  Pertinent problems: [name] does not have any pertinent problems on file.  Pain Assessment: Severity of Chronic pain is reported as a 4 /10. Location: Shoulder Left/to left elbow. Onset: More than a month ago. Quality: Aching, Dull. Timing: Constant. Modifying factor(s): procedure. Vitals:  height is 5' 4 (1.626 m) and weight is 140 lb (63.5 kg). Her temperature is 97.3 F (36.3 C) (abnormal). Her blood pressure is 143/71 (abnormal) and her pulse is 102 (abnormal). Her oxygen saturation is 100%.  BMI: Estimated body mass index is 24.03 kg/m as calculated from the following:   Height as of this encounter: 5' 4 (1.626 m).   Weight as of this encounter: 140 lb (63.5 kg).  Last encounter: 01/05/2024. Last procedure: 01/27/2024.  Reason for encounter:   Post-Procedure Evaluation   Procedure: Axillary nerve block  #1  Laterality:  Left  Level: Approximately 6 cm inferior to the left acromion Imaging: Ultrasound-guided          Anesthesia: Local anesthesia (1-2% Lidocaine ) Sedation: Valium  5 mg PO DOS: 05/20/2023  Performed by: Wallie Sherry, MD  Purpose: Diagnostic/Therapeutic Indications: Shoulder pain, severe enough to impact quality of life and/or function. 1. Injury of left axillary nerve, sequela   2. Chronic left shoulder pain   3. Hx of shoulder surgery   4. Left rotator cuff tear arthropathy     NAS-11 score:   Pre-procedure: 7 /10   Post-procedure: 0-No pain/10       Effectiveness:  Initial hour after procedure: 100 %  Subsequent 4-6 hours post-procedure: 100 %  Analgesia past initial 6 hours: 60 %  Ongoing improvement:  Analgesic:  60% Function: Somewhat improved ROM: Somewhat improved   ROS  Constitutional: Denies any fever or chills Gastrointestinal: No reported hemesis, hematochezia, vomiting, or acute GI distress Musculoskeletal: left shoulder pain Neurological: No reported episodes of acute onset apraxia, aphasia, dysarthria, agnosia, amnesia, paralysis, loss of coordination, or loss of consciousness  Medication Review  Blood Glucose Monitoring Suppl, OneTouch Delica Lancets 33G, Semaglutide (0.25 or 0.5MG /DOS), amLODipine , cyclobenzaprine , freestyle, gabapentin , lisinopril , meloxicam , metFORMIN , rosuvastatin , and traMADol   History Review  Allergy: Melissa Garrison has no known allergies. Drug: Melissa Garrison  reports that she does not currently use drugs. Alcohol:  reports that she does not currently use alcohol after a past usage of about 3.0 standard drinks of alcohol per week. Tobacco:  reports that she has been smoking cigarettes. She has a 5 pack-year smoking history. She has never used smokeless tobacco. Social: Melissa Garrison  reports that she has been smoking cigarettes. She has a 5 pack-year smoking history. She has never used smokeless  tobacco. She reports that she does not currently use alcohol after a past usage of about 3.0 standard drinks of alcohol per week. She reports  that she does not currently use drugs. Medical:  has a past medical history of Allergic rhinitis (07/11/2011), Diabetes mellitus without complication (HCC), Hepatitis C, Hepatitis C, chronic (HCC) (07/11/2011), Hyperlipidemia, Hypertension (07/11/2011), IV drug abuse (HCC), Vitamin D  deficiency, and Wears dentures. Surgical: Melissa Garrison  has a past surgical history that includes Laparoscopic hysterectomy; Cholecystectomy (2011); Humerus surgery (Left); Hydradenitis excision (Right, 03/20/2017); Breast biopsy (Left, 12/05/2015); Colonoscopy (2017); Shoulder arthroscopy with subacromial decompression and open rotator cuff repair, open bicep tendon repair (Left, 01/04/2021); Capsular release (Left, 07/02/2021); Lysis of adhesion (Left, 07/02/2021); Closed manipulation shoulder with steroid injection (Left, 07/02/2021); Shoulder arthroscopy (Left, 07/02/2021); Colonoscopy with propofol  (N/A, 05/20/2022); and Closed manipulation shoulder with steroid injection (Left, 08/11/2022). Family: family history includes Diabetes in her father; Hypertension in her brother and mother.  Laboratory Chemistry Profile   Renal Lab Results  Component Value Date   BUN 11 08/24/2023   CREATININE 0.66 08/24/2023   GFR 95.10 08/24/2023   GFRAA >60 03/16/2017   GFRNONAA >60 07/14/2022    Hepatic Lab Results  Component Value Date   AST 17 08/24/2023   ALT 12 08/24/2023   ALBUMIN 4.6 08/24/2023   ALKPHOS 49 08/24/2023    Electrolytes Lab Results  Component Value Date   NA 137 08/24/2023   K 4.2 08/24/2023   CL 99 08/24/2023   CALCIUM  10.7 (H) 08/24/2023    Bone Lab Results  Component Value Date   VD25OH 13.67 (L) 10/18/2021    Inflammation (CRP: Acute Phase) (ESR: Chronic Phase) No results found for: CRP, ESRSEDRATE, LATICACIDVEN       Note: Above Lab results reviewed.  Recent Imaging Review  MM 3D SCREENING MAMMOGRAM BILATERAL BREAST CLINICAL DATA:  Screening.  EXAM: DIGITAL SCREENING  BILATERAL MAMMOGRAM WITH TOMOSYNTHESIS AND CAD  TECHNIQUE: Bilateral screening digital craniocaudal and mediolateral oblique mammograms were obtained. Bilateral screening digital breast tomosynthesis was performed. The images were evaluated with computer-aided detection.  COMPARISON:  Previous exam(s).  ACR Breast Density Category c: The breasts are heterogeneously dense, which may obscure small masses.  FINDINGS: There are no findings suspicious for malignancy.  IMPRESSION: No mammographic evidence of malignancy. A result letter of this screening mammogram Garrison be mailed directly to the patient.  RECOMMENDATION: Screening mammogram in one year. (Code:SM-B-01Y)  BI-RADS CATEGORY  1: Negative.  Electronically Signed   By: Dina  Arceo M.D.   On: 02/05/2024 14:17 Note: Reviewed        Physical Exam  Vitals: BP (!) 143/71   Pulse (!) 102   Temp (!) 97.3 F (36.3 C)   Ht 5' 4 (1.626 m)   Wt 140 lb (63.5 kg)   SpO2 100%   BMI 24.03 kg/m  BMI: Estimated body mass index is 24.03 kg/m as calculated from the following:   Height as of this encounter: 5' 4 (1.626 m).   Weight as of this encounter: 140 lb (63.5 kg). Ideal: Ideal body weight: 54.7 kg (120 lb 9.5 oz) Adjusted ideal body weight: 58.2 kg (128 lb 5.7 oz) General appearance: Well nourished, well developed, and well hydrated. In no apparent acute distress Mental status: Alert, oriented x 3 (person, place, & time)       Respiratory: No evidence of acute respiratory distress Eyes: PERLA  Improvement in left shoulder pain and range of motion  Assessment   Diagnosis Status  1. Injury of left axillary nerve, sequela   2. Chronic left shoulder pain   3. Left rotator cuff tear arthropathy    Controlled Controlled Controlled   Updated Problems: No problems updated.  Plan of Care  Excellent analgesic benefit after left pulsed RFA of the left axillary nerve.  Patient endorses significant improvement in her left  shoulder pain and her range of motion.  She is able to complete ADLs more comfortably.  We can repeat every 3 to 4 months.  Orders:  Orders Placed This Encounter  Procedures   Radiofrequency Shoulder Joint    Standing Status:   Future    Expected Date:   05/02/2024    Expiration Date:   07/31/2024    Scheduling Instructions:     Expand All Collapse All          PROVIDER NOTE: Interpretation of information contained herein should be left to medically-trained personnel. Specific patient instructions are provided elsewhere under Patient Instructions section of medical record. This document was created in part using STT-dictation technology, any transcriptional errors that may result from this process are unintentional.     Patient: Annaclaire Walsworth     Type: Established     DOB: [birth year]     MRN: [id number]     PCP: Dineen Rollene MATSU, FNP  Service: Procedure     DOS: 01/27/2024     Setting: Ambulatory     Location: Ambulatory outpatient facility     Delivery: Face-to-face Provider: Wallie Sherry, MD     Specialty: Interventional Pain Management     Specialty designation: 09     Location: Outpatient facility     Ref. Prov.: Dineen Rollene MATSU, FNP                Interventional Therapy     Procedure: Ultrasound-Guided Pulsed Radiofrequency Ablation of the Left Axillary Nerve     Laterality:  Left      PO Valium     Where Garrison this procedure be performed?:   [facility]     Left SSNB 01/19/23, Left C3,4 MBNB, Left axillary nerve block X2; left peripheral axillary nerve stimulator 10/07/23, removed 12/07/2023: 80% pain relief      Return in about 8 weeks (around 05/02/2024) for left axillary pulsed RFA , in clinic (PO Valium  5mg ).    Recent Visits Date Type Provider Dept  01/27/24 Procedure visit Sherry Wallie, MD [facility]  01/05/24 Office Visit Sherry Wallie, MD [facility]  Showing recent visits within past 90 days and meeting all other  requirements Today's Visits Date Type Provider Dept  03/08/24 Office Visit Sherry Wallie, MD [facility]  Showing today's visits and meeting all other requirements Future Appointments No visits were found meeting these conditions. Showing future appointments within next 90 days and meeting all other requirements  I discussed the assessment and treatment plan with the patient. The patient was provided an opportunity to ask questions and all were answered. The patient agreed with the plan and demonstrated an understanding of the instructions.  Patient advised to call back or seek an in-person evaluation if the symptoms or condition worsens.  Duration of encounter: .  Total time on encounter, as per AMA guidelines included both the face-to-face and non-face-to-face time personally spent by the physician and/or other qualified health care professional(s) on the day of the encounter (includes time in activities that require the physician or other qualified health care professional and does not include time in  activities normally performed by clinical staff). Physician's time may include the following activities when performed: Preparing to see the patient (e.g., pre-charting review of records, searching for previously ordered imaging, lab work, and nerve conduction tests) Review of prior analgesic pharmacotherapies. Reviewing PMP Interpreting ordered tests (e.g., lab work, imaging, nerve conduction tests) Performing post-procedure evaluations, including interpretation of diagnostic procedures Obtaining and/or reviewing separately obtained history Performing a medically appropriate examination and/or evaluation Counseling and educating the patient/family/caregiver Ordering medications, tests, or procedures Referring and communicating with other health care professionals (when not separately reported) Documenting clinical information in the electronic or other health  record Independently interpreting results (not separately reported) and communicating results to the patient/ family/caregiver Care coordination (not separately reported)  Note by: Wallie Sherry, MD (TTS and AI technology used. I apologize for any typographical errors that were not detected and corrected.) Date: 03/08/2024; Time: 10:19 AM

## 2024-03-08 NOTE — Patient Instructions (Signed)
GENERAL RISKS AND COMPLICATIONS  What are the risk, side effects and possible complications? Generally speaking, most procedures are safe.  However, with any procedure there are risks, side effects, and the possibility of complications.  The risks and complications are dependent upon the sites that are lesioned, or the type of nerve block to be performed.  The closer the procedure is to the spine, the more serious the risks are.  Great care is taken when placing the radio frequency needles, block needles or lesioning probes, but sometimes complications can occur. Infection: Any time there is an injection through the skin, there is a risk of infection.  This is why sterile conditions are used for these blocks.  There are four possible types of infection. Localized skin infection. Central Nervous System Infection-This can be in the form of Meningitis, which can be deadly. Epidural Infections-This can be in the form of an epidural abscess, which can cause pressure inside of the spine, causing compression of the spinal cord with subsequent paralysis. This would require an emergency surgery to decompress, and there are no guarantees that the patient would recover from the paralysis. Discitis-This is an infection of the intervertebral discs.  It occurs in about 1% of discography procedures.  It is difficult to treat and it may lead to surgery.        2. Pain: the needles have to go through skin and soft tissues, will cause soreness.       3. Damage to internal structures:  The nerves to be lesioned may be near blood vessels or    other nerves which can be potentially damaged.       4. Bleeding: Bleeding is more common if the patient is taking blood thinners such as  aspirin, Coumadin, Ticiid, Plavix, etc., or if he/she have some genetic predisposition  such as hemophilia. Bleeding into the spinal canal can cause compression of the spinal  cord with subsequent paralysis.  This would require an emergency  surgery to  decompress and there are no guarantees that the patient would recover from the  paralysis.       5. Pneumothorax:  Puncturing of a lung is a possibility, every time a needle is introduced in  the area of the chest or upper back.  Pneumothorax refers to free air around the  collapsed lung(s), inside of the thoracic cavity (chest cavity).  Another two possible  complications related to a similar event would include: Hemothorax and Chylothorax.   These are variations of the Pneumothorax, where instead of air around the collapsed  lung(s), you may have blood or chyle, respectively.       6. Spinal headaches: They may occur with any procedures in the area of the spine.       7. Persistent CSF (Cerebro-Spinal Fluid) leakage: This is a rare problem, but may occur  with prolonged intrathecal or epidural catheters either due to the formation of a fistulous  track or a dural tear.       8. Nerve damage: By working so close to the spinal cord, there is always a possibility of  nerve damage, which could be as serious as a permanent spinal cord injury with  paralysis.       9. Death:  Although rare, severe deadly allergic reactions known as "Anaphylactic  reaction" can occur to any of the medications used.      10. Worsening of the symptoms:  We can always make thing worse.  What are the chances   of something like this happening? Chances of any of this occuring are extremely low.  By statistics, you have more of a chance of getting killed in a motor vehicle accident: while driving to the hospital than any of the above occurring .  Nevertheless, you should be aware that they are possibilities.  In general, it is similar to taking a shower.  Everybody knows that you can slip, hit your head and get killed.  Does that mean that you should not shower again?  Nevertheless always keep in mind that statistics do not mean anything if you happen to be on the wrong side of them.  Even if a procedure has a 1 (one) in a  1,000,000 (million) chance of going wrong, it you happen to be that one..Also, keep in mind that by statistics, you have more of a chance of having something go wrong when taking medications.  Who should not have this procedure? If you are on a blood thinning medication (e.g. Coumadin, Plavix, see list of "Blood Thinners"), or if you have an active infection going on, you should not have the procedure.  If you are taking any blood thinners, please inform your physician.  How should I prepare for this procedure? Do not eat or drink anything at least six hours prior to the procedure. Bring a driver with you .  It cannot be a taxi. Come accompanied by an adult that can drive you back, and that is strong enough to help you if your legs get weak or numb from the local anesthetic. Take all of your medicines the morning of the procedure with just enough water to swallow them. If you have diabetes, make sure that you are scheduled to have your procedure done first thing in the morning, whenever possible. If you have diabetes, take only half of your insulin dose and notify our nurse that you have done so as soon as you arrive at the clinic. If you are diabetic, but only take blood sugar pills (oral hypoglycemic), then do not take them on the morning of your procedure.  You may take them after you have had the procedure. Do not take aspirin or any aspirin-containing medications, at least eleven (11) days prior to the procedure.  They may prolong bleeding. Wear loose fitting clothing that may be easy to take off and that you would not mind if it got stained with Betadine or blood. Do not wear any jewelry or perfume Remove any nail coloring.  It will interfere with some of our monitoring equipment.  NOTE: Remember that this is not meant to be interpreted as a complete list of all possible complications.  Unforeseen problems may occur.  BLOOD THINNERS The following drugs contain aspirin or other products,  which can cause increased bleeding during surgery and should not be taken for 2 weeks prior to and 1 week after surgery.  If you should need take something for relief of minor pain, you may take acetaminophen which is found in Tylenol,m Datril, Anacin-3 and Panadol. It is not blood thinner. The products listed below are.  Do not take any of the products listed below in addition to any listed on your instruction sheet.  A.P.C or A.P.C with Codeine Codeine Phosphate Capsules #3 Ibuprofen Ridaura  ABC compound Congesprin Imuran rimadil  Advil Cope Indocin Robaxisal  Alka-Seltzer Effervescent Pain Reliever and Antacid Coricidin or Coricidin-D  Indomethacin Rufen  Alka-Seltzer plus Cold Medicine Cosprin Ketoprofen S-A-C Tablets  Anacin Analgesic Tablets or Capsules Coumadin   Korlgesic Salflex  Anacin Extra Strength Analgesic tablets or capsules CP-2 Tablets Lanoril Salicylate  Anaprox Cuprimine Capsules Levenox Salocol  Anexsia-D Dalteparin Magan Salsalate  Anodynos Darvon compound Magnesium Salicylate Sine-off  Ansaid Dasin Capsules Magsal Sodium Salicylate  Anturane Depen Capsules Marnal Soma  APF Arthritis pain formula Dewitt's Pills Measurin Stanback  Argesic Dia-Gesic Meclofenamic Sulfinpyrazone  Arthritis Bayer Timed Release Aspirin Diclofenac Meclomen Sulindac  Arthritis pain formula Anacin Dicumarol Medipren Supac  Analgesic (Safety coated) Arthralgen Diffunasal Mefanamic Suprofen  Arthritis Strength Bufferin Dihydrocodeine Mepro Compound Suprol  Arthropan liquid Dopirydamole Methcarbomol with Aspirin Synalgos  ASA tablets/Enseals Disalcid Micrainin Tagament  Ascriptin Doan's Midol Talwin  Ascriptin A/D Dolene Mobidin Tanderil  Ascriptin Extra Strength Dolobid Moblgesic Ticlid  Ascriptin with Codeine Doloprin or Doloprin with Codeine Momentum Tolectin  Asperbuf Duoprin Mono-gesic Trendar  Aspergum Duradyne Motrin or Motrin IB Triminicin  Aspirin plain, buffered or enteric coated  Durasal Myochrisine Trigesic  Aspirin Suppositories Easprin Nalfon Trillsate  Aspirin with Codeine Ecotrin Regular or Extra Strength Naprosyn Uracel  Atromid-S Efficin Naproxen Ursinus  Auranofin Capsules Elmiron Neocylate Vanquish  Axotal Emagrin Norgesic Verin  Azathioprine Empirin or Empirin with Codeine Normiflo Vitamin E  Azolid Emprazil Nuprin Voltaren  Bayer Aspirin plain, buffered or children's or timed BC Tablets or powders Encaprin Orgaran Warfarin Sodium  Buff-a-Comp Enoxaparin Orudis Zorpin  Buff-a-Comp with Codeine Equegesic Os-Cal-Gesic   Buffaprin Excedrin plain, buffered or Extra Strength Oxalid   Bufferin Arthritis Strength Feldene Oxphenbutazone   Bufferin plain or Extra Strength Feldene Capsules Oxycodone with Aspirin   Bufferin with Codeine Fenoprofen Fenoprofen Pabalate or Pabalate-SF   Buffets II Flogesic Panagesic   Buffinol plain or Extra Strength Florinal or Florinal with Codeine Panwarfarin   Buf-Tabs Flurbiprofen Penicillamine   Butalbital Compound Four-way cold tablets Penicillin   Butazolidin Fragmin Pepto-Bismol   Carbenicillin Geminisyn Percodan   Carna Arthritis Reliever Geopen Persantine   Carprofen Gold's salt Persistin   Chloramphenicol Goody's Phenylbutazone   Chloromycetin Haltrain Piroxlcam   Clmetidine heparin Plaquenil   Cllnoril Hyco-pap Ponstel   Clofibrate Hydroxy chloroquine Propoxyphen         Before stopping any of these medications, be sure to consult the physician who ordered them.  Some, such as Coumadin (Warfarin) are ordered to prevent or treat serious conditions such as "deep thrombosis", "pumonary embolisms", and other heart problems.  The amount of time that you may need off of the medication may also vary with the medication and the reason for which you were taking it.  If you are taking any of these medications, please make sure you notify your pain physician before you undergo any procedures.         Moderate Conscious  Sedation, Adult Sedation is the use of medicines to help you relax and not feel pain. Moderate conscious sedation is a type of sedation that makes you less alert than normal. You are still able to respond to instructions, touch, or both. This type of sedation is used during short medical and dental procedures. It is milder than deep sedation, which is a type of sedation you cannot be easily woken up from. It is also milder than general anesthesia, which is the use of medicines to make you fall asleep. Moderate conscious sedation lets you return to your normal activities sooner. Tell a health care provider about: Any allergies you have. All medicines you are taking, including vitamins, herbs, steroids, eye drops, creams, and over-the-counter medicines. Any problems you or family members have had with anesthesia.   Any bleeding problems you have. Any surgeries you have had. Any medical conditions you have. Whether you are pregnant or may be pregnant. Any recent alcohol, tobacco, or drug use. What are the risks? Your health care provider will talk with you about risks. These may include: Oversedation. This is when you get too much medicine. Nausea or vomiting. Allergic reaction to medicines. Trouble breathing. If this happens, a breathing tube may be used. It will be removed when you can breathe better on your own. Heart trouble. Lung trouble. Emergence delirium. This is when you feel confused while the sedation wears off. This gets better with time. What happens before the procedure? When to stop eating and drinking Follow instructions from your health care provider about what you may eat and drink. These may include: 8 hours before your procedure Stop eating most foods. Do not eat meat, fried foods, or fatty foods. Eat only light foods, such as toast or crackers. All liquids are okay except energy drinks and alcohol. 6 hours before your procedure Stop eating. Drink only clear liquids, such  as water, clear fruit juice, black coffee, plain tea, and sports drinks. Do not drink energy drinks or alcohol. 2 hours before your procedure Stop drinking all liquids. You may be allowed to take medicines with small sips of water. If you do not follow your health care provider's instructions, your procedure may be delayed or canceled. Medicines Ask your health care provider about: Changing or stopping your regular medicines. These include any diabetes medicines or blood thinners you take. Taking medicines such as aspirin and ibuprofen. These medicines can thin your blood. Do not take them unless your health care provider tells you to. Taking over-the-counter medicines, vitamins, herbs, and supplements. Tests and exams You may have an exam or testing. You may have a blood or urine sample taken. General instructions Do not use any products that contain nicotine or tobacco for at least 4 weeks before the procedure. These products include cigarettes, chewing tobacco, and vaping devices, such as e-cigarettes. If you need help quitting, ask your health care provider. If you will be going home right after the procedure, plan to have a responsible adult: Take you home from the hospital or clinic. You will not be allowed to drive. Care for you for the time you are told. What happens during the procedure?  You will be given the sedative. It may be given: As a pill you can take by mouth. It can also be put into the rectum. As a spray through the nose. As an injection into muscle. As an injection into a vein through an IV. You may be given oxygen as needed. Your blood pressure, heart rate, breathing rate, and blood oxygen level will be monitored during the procedure. The medical or dental procedure will be done. The procedure may vary among health care providers and hospitals. What happens after the procedure? Your blood pressure, heart rate, breathing rate, and blood oxygen level will be  monitored until you leave the hospital or clinic. You will get fluids through an IV as needed. Do not drive or operate machinery until your health care provider says that it is safe. This information is not intended to replace advice given to you by your health care provider. Make sure you discuss any questions you have with your health care provider. Document Revised: 12/23/2021 Document Reviewed: 12/23/2021 Else[employer] Patient Education  2024 [employer] Radiofrequency Ablation Radiofrequency ablation is a procedure that is performed to relieve pain. The   procedure is often used for back, neck, or arm pain. Radiofrequency ablation involves the use of a machine that creates radio waves to make heat. During the procedure, the heat is applied to the nerve that carries the pain signal. The heat damages the nerve and interferes with the pain signal. Pain relief usually starts about 2 weeks after the procedure and lasts for 6 months to 1 year. Tell a health care provider about: Any allergies you have. All medicines you are taking, including vitamins, herbs, eye drops, creams, and over-the-counter medicines. Any problems you or family members have had with anesthetic medicines. Any bleeding problems you have. Any surgeries you have had. Any medical conditions you have. Whether you are pregnant or may be pregnant. What are the risks? Generally, this is a safe procedure. However, problems may occur, including: Pain or soreness at the injection site. Allergic reaction to medicines given during the procedure. Bleeding. Infection at the injection site. Damage to nerves or blood vessels. What happens before the procedure? When to stop eating and drinking Follow instructions from your health care provider about what you may eat and drink before your procedure. These may include: 8 hours before the procedure Stop eating most foods. Do not eat meat, fried foods, or fatty foods. Eat only light foods,  such as toast or crackers. All liquids are okay except energy drinks and alcohol. 6 hours before the procedure Stop eating. Drink only clear liquids, such as water, clear fruit juice, black coffee, plain tea, and sports drinks. Do not drink energy drinks or alcohol. 2 hours before the procedure Stop drinking all liquids. You may be allowed to take medicine with small sips of water. If you do not follow your health care provider's instructions, your procedure may be delayed or canceled. Medicines Ask your health care provider about: Changing or stopping your regular medicines. This is especially important if you are taking diabetes medicines or blood thinners. Taking medicines such as aspirin and ibuprofen. These medicines can thin your blood. Do not take these medicines unless your health care provider tells you to take them. Taking over-the-counter medicines, vitamins, herbs, and supplements. General instructions Ask your health care provider what steps will be taken to help prevent infection. These steps may include: Removing hair at the procedure site. Washing skin with a germ-killing soap. Taking antibiotic medicine. If you will be going home right after the procedure, plan to have a responsible adult: Take you home from the hospital or clinic. You will not be allowed to drive. Care for you for the time you are told. What happens during the procedure?  You will be awake during the procedure. You will need to be able to talk with the health care provider during the procedure. An IV will be inserted into one of your veins. You will be given one or more of the following: A medicine to help you relax (sedative). A medicine to numb the area (local anesthetic). Your health care provider will insert a radiofrequency needle into the area to be treated. This is done with the help of fluoroscopy. A wire that carries the radio waves (electrode) will be put through the radiofrequency  needle. An electrical pulse will be sent through the electrode to verify the correct nerve that is causing your pain. You will feel a tingling sensation, and you may have muscle twitching. The tissue around the needle tip will be heated by an electric current that comes from the radiofrequency machine. This will numb the nerves.   The needle will be removed. A bandage (dressing) will be put on the insertion area. The procedure may vary among health care providers and hospitals. What happens after the procedure? Your blood pressure, heart rate, breathing rate, and blood oxygen level will be monitored until you leave the hospital or clinic. Return to your normal activities as told by your health care provider. Ask your health care provider what activities are safe for you. If you were given a sedative during the procedure, it can affect you for several hours. Do not drive or operate machinery until your health care provider says that it is safe. Summary Radiofrequency ablation is a procedure that is performed to relieve pain. The procedure is often used for back, neck, or arm pain. Radiofrequency ablation involves the use of a machine that creates radio waves to make heat. Plan to have a responsible adult take you home from the hospital or clinic. Do not drive or operate machinery until your health care provider says that it is safe. Return to your normal activities as told by your health care provider. Ask your health care provider what activities are safe for you. This information is not intended to replace advice given to you by your health care provider. Make sure you discuss any questions you have with your health care provider. Document Revised: 11/27/2020 Document Reviewed: 11/27/2020 Else[employer] Patient Education  2024 [employer]  

## 2024-03-08 NOTE — Progress Notes (Signed)
 Safety precautions to be maintained throughout the outpatient stay will include: orient to surroundings, keep bed in low position, maintain call bell within reach at all times, provide assistance with transfer out of bed and ambulation.

## 2024-03-09 ENCOUNTER — Ambulatory Visit (INDEPENDENT_AMBULATORY_CARE_PROVIDER_SITE_OTHER): Admitting: *Deleted

## 2024-03-09 VITALS — Ht 64.0 in | Wt 140.0 lb

## 2024-03-09 DIAGNOSIS — Z0000 Encounter for general adult medical examination without abnormal findings: Secondary | ICD-10-CM

## 2024-03-09 NOTE — Patient Instructions (Signed)
 Melissa Garrison,  Thank you for taking the time for your Medicare Wellness Visit. I appreciate your continued commitment to your health goals. Please review the care plan we discussed, and feel free to reach out if I can assist you further.  Medicare recommends these wellness visits once per year to help you and your care team stay ahead of potential health issues. These visits are designed to focus on prevention, allowing your provider to concentrate on managing your acute and chronic conditions during your regular appointments.  Please note that Annual Wellness Visits do not include a physical exam. Some assessments may be limited, especially if the visit was conducted virtually. If needed, we may recommend a separate in-person follow-up with your provider.  Ongoing Care Seeing your primary care provider every 3 to 6 months helps us  monitor your health and provide consistent, personalized care.  Remember to update your tetanus, shingles, pneumonia and flu vaccines.  Referrals If a referral was made during today's visit and you haven't received any updates within two weeks, please contact the referred provider directly to check on the status.  Recommended Screenings:  Health Maintenance  Topic Date Due   Pneumococcal Vaccine for age over 63 (1 of 2 - PCV) Never done   Zoster (Shingles) Vaccine (1 of 2) Never done   DTaP/Tdap/Td vaccine (2 - Td or Tdap) 08/23/2024*   Flu Shot  09/20/2024*   Eye exam for diabetics  03/19/2024   Hemoglobin A1C  07/30/2024   Yearly kidney function blood test for diabetes  08/23/2024   Yearly kidney health urinalysis for diabetes  08/23/2024   Complete foot exam   08/23/2024   Breast Cancer Screening  02/03/2025   Medicare Annual Wellness Visit  03/09/2025   Pap with HPV screening  10/10/2025   Colon Cancer Screening  05/21/2027   Hepatitis B Vaccine  Completed   COVID-19 Vaccine  Completed   Hepatitis C Screening  Completed   HPV Vaccine  Aged Out    Meningitis B Vaccine  Aged Out   HIV Screening  Discontinued  *Topic was postponed. The date shown is not the original due date.       03/09/2024    1:13 PM  Advanced Directives  Does Patient Have a Medical Advance Directive? No  Would patient like information on creating a medical advance directive? No - Patient declined   Advance Care Planning is important because it: Ensures you receive medical care that aligns with your values, goals, and preferences. Provides guidance to your family and loved ones, reducing the emotional burden of decision-making during critical moments.  Vision: Annual vision screenings are recommended for early detection of glaucoma, cataracts, and diabetic retinopathy. These exams can also reveal signs of chronic conditions such as diabetes and high blood pressure.  Dental: Annual dental screenings help detect early signs of oral cancer, gum disease, and other conditions linked to overall health, including heart disease and diabetes.  Please see the attached documents for additional preventive care recommendations.   Managing Pain Without Opioids Opioids are strong medicines used to treat moderate to severe pain. For some people, especially those who have long-term (chronic) pain, opioids may not be the best choice for pain management due to: Side effects like nausea, constipation, and sleepiness. The risk of addiction (opioid use disorder). The longer you take opioids, the greater your risk of addiction. Pain that lasts for more than 3 months is called chronic pain. Managing chronic pain usually requires more than one approach  and is often provided by a team of health care providers working together (multidisciplinary approach). Pain management may be done at a pain management center or pain clinic. How to manage pain without the use of opioids Use non-opioid medicines Non-opioid medicines for pain may include: Over-the-counter or prescription non-steroidal  anti-inflammatory drugs (NSAIDs). These may be the first medicines used for pain. They work well for muscle and bone pain, and they reduce swelling. Acetaminophen . This over-the-counter medicine may work well for milder pain but not swelling. Antidepressants. These may be used to treat chronic pain. A certain type of antidepressant (tricyclics) is often used. These medicines are given in lower doses for pain than when used for depression. Anticonvulsants. These are usually used to treat seizures but may also reduce nerve (neuropathic) pain. Muscle relaxants. These relieve pain caused by sudden muscle tightening (spasms). You may also use a pain medicine that is applied to the skin as a patch, cream, or gel (topical analgesic), such as a numbing medicine. These may cause fewer side effects than medicines taken by mouth. Do certain therapies as directed Some therapies can help with pain management. They include: Physical therapy. You will do exercises to gain strength and flexibility. A physical therapist may teach you exercises to move and stretch parts of your body that are weak, stiff, or painful. You can learn these exercises at physical therapy visits and practice them at home. Physical therapy may also involve: Massage. Heat wraps or applying heat or cold to affected areas. Electrical signals that interrupt pain signals (transcutaneous electrical nerve stimulation, TENS). Weak lasers that reduce pain and swelling (low-level laser therapy). Signals from your body that help you learn to regulate pain (biofeedback). Occupational therapy. This helps you to learn ways to function at home and work with less pain. Recreational therapy. This involves trying new activities or hobbies, such as a physical activity or drawing. Mental health therapy, including: Cognitive behavioral therapy (CBT). This helps you learn coping skills for dealing with pain. Acceptance and commitment therapy (ACT) to change the  way you think and react to pain. Relaxation therapies, including muscle relaxation exercises and mindfulness-based stress reduction. Pain management counseling. This may be individual, family, or group counseling.  Receive medical treatments Medical treatments for pain management include: Nerve block injections. These may include a pain blocker and anti-inflammatory medicines. You may have injections: Near the spine to relieve chronic back or neck pain. Into joints to relieve back or joint pain. Into nerve areas that supply a painful area to relieve body pain. Into muscles (trigger point injections) to relieve some painful muscle conditions. A medical device placed near your spine to help block pain signals and relieve nerve pain or chronic back pain (spinal cord stimulation device). Acupuncture. Follow these instructions at home Medicines Take over-the-counter and prescription medicines only as told by your health care provider. If you are taking pain medicine, ask your health care providers about possible side effects to watch out for. Do not drive or use heavy machinery while taking prescription opioid pain medicine. Lifestyle  Do not use drugs or alcohol to reduce pain. If you drink alcohol, limit how much you have to: 0-1 drink a day for women who are not pregnant. 0-2 drinks a day for men. Know how much alcohol is in a drink. In the [place] one drink equals one 12 oz bottle of beer (355 mL), one 5 oz glass of wine (148 mL), or one 1 oz glass of hard liquor (44 mL).  Do not use any products that contain nicotine or tobacco. These products include cigarettes, chewing tobacco, and vaping devices, such as e-cigarettes. If you need help quitting, ask your health care provider. Eat a healthy diet and maintain a healthy weight. Poor diet and excess weight may make pain worse. Eat foods that are high in fiber. These include fresh fruits and vegetables, whole grains, and beans. Limit foods that  are high in fat and processed sugars, such as fried and sweet foods. Exercise regularly. Exercise lowers stress and may help relieve pain. Ask your health care provider what activities and exercises are safe for you. If your health care provider approves, join an exercise class that combines movement and stress reduction. Examples include yoga and tai chi. Get enough sleep. Lack of sleep may make pain worse. Lower stress as much as possible. Practice stress reduction techniques as told by your therapist. General instructions Work with all your pain management providers to find the treatments that work best for you. You are an important member of your pain management team. There are many things you can do to reduce pain on your own. Consider joining an online or in-person support group for people who have chronic pain. Keep all follow-up visits. This is important. Where to find more information You can find more information about managing pain without opioids from: American Academy of Pain Medicine: painmed.org Institute for Chronic Pain: instituteforchronicpain.org American Chronic Pain Association: theacpa.org Contact a health care provider if: You have side effects from pain medicine. Your pain gets worse or does not get better with treatments or home therapy. You are struggling with anxiety or depression. Summary Many types of pain can be managed without opioids. Chronic pain may respond better to pain management without opioids. Pain is best managed when you and a team of health care providers work together. Pain management without opioids may include non-opioid medicines, medical treatments, physical therapy, mental health therapy, and lifestyle changes. Tell your health care providers if your pain gets worse or is not being managed well enough. This information is not intended to replace advice given to you by your health care provider. Make sure you discuss any questions you have with  your health care provider. Document Revised: 09/19/2020 Document Reviewed: 09/19/2020 [employer]vier Patient Education  2024 [employer].

## 2024-03-09 NOTE — Progress Notes (Signed)
 Subjective:   Melissa Garrison is a 63 y.o. who presents for a Medicare Wellness preventive visit.  As a reminder, Annual Wellness Visits don't include a physical exam, and some assessments may be limited, especially if this visit is performed virtually. We may recommend an in-person follow-up visit with your provider if needed.  Visit Complete: Virtual I connected with  [name] on 03/09/24 by a audio enabled telemedicine application and verified that I am speaking with the correct person using two identifiers.  Patient Location: Home  Provider Location: Home Office  I discussed the limitations of evaluation and management by telemedicine. The patient expressed understanding and agreed to proceed.  Vital Signs: Because this visit was a virtual/telehealth visit, some criteria may be missing or patient reported. Any vitals not documented were not able to be obtained and vitals that have been documented are patient reported.  VideoDeclined- This patient declined Librarian, academic. Therefore the visit was completed with audio only.  Persons Participating in Visit: Patient.  AWV Questionnaire: No: Patient Medicare AWV questionnaire was not completed prior to this visit.  Cardiac Risk Factors include: diabetes mellitus;hypertension;dyslipidemia;smoking/ tobacco exposure     Objective:    Today's Vitals   03/09/24 1301 03/09/24 1302  Weight: 140 lb (63.5 kg)   Height: 5' 4 (1.626 m)   PainSc:  4    Body mass index is 24.03 kg/m.     03/09/2024    1:13 PM 03/08/2024    9:50 AM 01/27/2024    9:47 AM 01/05/2024   11:01 AM 12/07/2023    2:41 PM 10/07/2023    7:47 AM 09/01/2023   10:02 AM  Advanced Directives  Does Patient Have a Medical Advance Directive? No No No No No No No  Would patient like information on creating a medical advance directive? No - Patient declined  No - Patient declined No - Patient declined No - Patient declined No - Patient  declined No - Patient declined    Current Medications (verified) Outpatient Encounter Medications as of 03/09/2024  Medication Sig   amLODipine  (NORVASC ) 5 MG tablet Take 1 tablet (5 mg total) by mouth daily.   Blood Glucose Monitoring Suppl DEVI 1 each by Does not apply route in the morning, at noon, and at bedtime. May substitute to any manufacturer covered by patient's insurance.   cyclobenzaprine  (FLEXERIL ) 5 MG tablet Take 1-2 tablets (5-10 mg total) by mouth 3 (three) times daily as needed for muscle spasms.   gabapentin  (NEURONTIN ) 100 MG capsule Take 1 capsule (100 mg total) by mouth 3 (three) times daily.   Lancets (FREESTYLE) lancets Use as instructed   lisinopril  (ZESTRIL ) 40 MG tablet Take 1 tablet (40 mg total) by mouth daily.   meloxicam  (MOBIC ) 7.5 MG tablet Take 7.5 mg by mouth daily.   metFORMIN  (GLUCOPHAGE -XR) 500 MG 24 hr tablet Take 2 tablets (1,000 mg total) by mouth 2 (two) times daily with a meal.   OneTouch Delica Lancets 33G MISC Used to check blood sugar once daily.   OZEMPIC , 0.25 OR 0.5 MG/DOSE, 2 MG/3ML SOPN INJECT 0.5MG  UNDER THE SKIN  ONCE A WEEK   rosuvastatin  (CRESTOR ) 10 MG tablet Take 1 tablet daily three days per week   traMADol  (ULTRAM ) 50 MG tablet Take 1 tablet (50 mg total) by mouth 3 (three) times daily as needed.   No facility-administered encounter medications on file as of 03/09/2024.    Allergies (verified) Patient has no known allergies.   History:  Past Medical History:  Diagnosis Date   Allergic rhinitis 07/11/2011   Diabetes mellitus without complication (HCC)    Hepatitis C    Dx: 2011 Dr Vern STATES SHE WENT THROUGH TREATMENT AND STATES HEP C IS NOW RESOLVED    Hepatitis C, chronic (HCC) 07/11/2011   Hyperlipidemia    Hypertension 07/11/2011   IV drug abuse (HCC)    HISTORY - none since before 2000   Vitamin D  deficiency    Wears dentures    partial upper   Past Surgical History:  Procedure Laterality Date   BREAST  BIOPSY Left 12/05/2015   stereotactic biopsy- benign   CAPSULAR RELEASE Left 07/02/2021   Procedure: CAPSULAR RELEASE;  Surgeon: Tobie Priest, MD;  Location: [facility];  Service: Orthopedics;  Laterality: Left;   CHOLECYSTECTOMY  2011   CLOSED MANIPULATION SHOULDER WITH STERIOD INJECTION Left 07/02/2021   Procedure: CLOSED MANIPULATION SHOULDER WITH STEROID INJECTION;  Surgeon: Tobie Priest, MD;  Location: [facility];  Service: Orthopedics;  Laterality: Left;   CLOSED MANIPULATION SHOULDER WITH STERIOD INJECTION Left 08/11/2022   Procedure: Left shoulder arthroscopic lysis of adhesions, capsular release, and manipulation under anesthesia with corticosteroid injection;  Surgeon: Tobie Priest, MD;  Location: [facility];  Service: Orthopedics;  Laterality: Left;  Diabetic   COLONOSCOPY  2017   COLONOSCOPY WITH PROPOFOL  N/A 05/20/2022   Procedure: COLONOSCOPY WITH PROPOFOL ;  Surgeon: Maryruth Ole DASEN, MD;  Location: [facility];  Service: Endoscopy;  Laterality: N/A;   HUMERUS SURGERY Left    due to fracture has plate   HYDRADENITIS EXCISION Right 03/20/2017   Procedure: EXCISION HIDRADENITIS AXILLA;  Surgeon: Dessa Reyes ORN, MD;  Location: [facility] ORS;  Service: General;  Laterality: Right;   LAPAROSCOPIC HYSTERECTOMY     for noncancerous reason for fibroids. Still has ovaries. no cervix on exam 10/10/20   LYSIS OF ADHESION Left 07/02/2021   Procedure: LYSIS OF ADHESION;  Surgeon: Tobie Priest, MD;  Location: [facility];  Service: Orthopedics;  Laterality: Left;   SHOULDER ARTHROSCOPY Left 07/02/2021   Procedure: ARTHROSCOPY SHOULDER;arthoscopic lysis of adhesions, capsular release, and manipulation under anesthesia with coricosteroid injection;  Surgeon: Tobie Priest, MD;  Location: [facility];  Service: Orthopedics;  Laterality: Left;   SHOULDER ARTHROSCOPY WITH SUBACROMIAL DECOMPRESSION AND OPEN ROTATOR C Left 01/04/2021   Procedure: Left  shoulder arthroscopic rotator cuff repair  and subacromial decompression, and biceps tenodesis;  Surgeon: Tobie Priest, MD;  Location: [facility];  Service: Orthopedics;  Laterality: Left;   Family History  Problem Relation Age of Onset   Hypertension Mother    Diabetes Father    Hypertension Brother    Breast cancer Neg Hx    Colon cancer Neg Hx    Thyroid cancer Neg Hx    Social History   Socioeconomic History   Marital status: Single    Spouse name: Not on file   Number of children: 0   Years of education: Not on file   Highest education level: GED or equivalent  Occupational History   Occupation: Air traffic controller    Employer: [employer]   Tobacco Use   Smoking status: Every Day    Current packs/day: 0.50    Average packs/day: 0.5 packs/day for 10.0 years (5.0 ttl pk-yrs)    Types: Cigarettes   Smokeless tobacco: Never   Tobacco comments:    4/day (1 pack lasts all week)  Vaping Use   Vaping status: Never Used  Substance and Sexual Activity   Alcohol use: Not Currently    Alcohol/week: 3.0 standard drinks of alcohol    Types: 2 Cans of beer, 1 Shots of liquor per week    Comment: stopped 10/2022   Drug use: Not Currently    Comment: h/o iv drug use years ago (pre 2000)   Sexual activity: Not on file  Other Topics Concern   Not on file  Social History Narrative   Lives in [place] with mother. Dog. Works at [employer], Chief Strategy Officer.Works day shift.       Regular Exercise -  GYM once a week x 1 hour, stretching daily   Daily Caffeine Use:  3 times a week - coffee         Social Drivers of Corporate investment banker Strain: Low Risk  (03/09/2024)   Overall Financial Resource Strain (CARDIA)    Difficulty of Paying Living Expenses: Not hard at all  Food Insecurity: No Food Insecurity (03/09/2024)   Hunger Vital Sign    Worried About Running Out of Food in the Last Year: Never true    Ran Out of Food in the Last Year: Never true  Transportation  Needs: No Transportation Needs (03/09/2024)   PRAPARE - Administrator, Civil Service (Medical): No    Lack of Transportation (Non-Medical): No  Physical Activity: Inactive (03/09/2024)   Exercise Vital Sign    Days of Exercise per Week: 0 days    Minutes of Exercise per Session: 0 min  Stress: No Stress Concern Present (03/09/2024)   [employer] of Occupational Health - Occupational Stress Questionnaire    Feeling of Stress: Not at all  Social Connections: Moderately Isolated (03/09/2024)   Social Connection and Isolation Panel    Frequency of Communication with Friends and Family: More than three times a week    Frequency of Social Gatherings with Friends and Family: More than three times a week    Attends Religious Services: More than 4 times per year    Active Member of [employer] or Organizations: No    Attends Banker Meetings: Never    Marital Status: Never married    Tobacco Counseling Ready to quit: Yes Counseling given: Yes Tobacco comments: 4/day (1 pack lasts all week)    Clinical Intake:  Pre-visit preparation completed: Yes  Pain : No/denies pain Pain Score: 4  Pain Type: Chronic pain Pain Location: Shoulder Pain Orientation: Left Pain Descriptors / Indicators: Aching, Dull Pain Onset: More than a month ago Pain Frequency: Intermittent     BMI - recorded: 24.03 Nutritional Status: BMI of 19-24  Normal Nutritional Risks: None Diabetes: Yes CBG done?: Yes (FBS 117 per patient)  Lab Results  Component Value Date   HGBA1C 6.3 (A) 01/28/2024   HGBA1C 6.6 (A) 08/24/2023   HGBA1C 7.2 (A) 05/25/2023     How often do you need to have someone help you when you read instructions, pamphlets, or other written materials from your doctor or pharmacy?: 1 - Never  Interpreter Needed?: No  Information entered by :: R. Dawnya Grams LPN   Activities of Daily Living     03/09/2024    1:04 PM  In your present state of health, do you have any  difficulty performing the following activities:  Hearing? 0  Vision? 0  Difficulty concentrating or making decisions? 0  Walking or climbing stairs? 0  Dressing or bathing? 0  Doing errands, shopping? 0  Preparing Food and  eating ? N  Using the Toilet? N  In the past six months, have you accidently leaked urine? N  Do you have problems with loss of bowel control? N  Managing your Medications? N  Managing your Finances? N  Housekeeping or managing your Housekeeping? N    Patient Care Team: Dineen Rollene MATSU, FNP as PCP - General (Family Medicine) Blocker, Ozell BRAVO, MD as Referring Physician (Specialist) Burnetta Madelin Hurst, FNP (Inactive) (Family Medicine) Dessa Reyes ORN, MD (General Surgery)  I have updated your Care Teams any recent Medical Services you may have received from other providers in the past year.     Assessment:   This is a routine wellness examination for [place].  Hearing/Vision screen Hearing Screening - Comments:: No issues Vision Screening - Comments:: glasses   Goals Addressed             This Visit's Progress    Patient Stated       Wants to quit smoking        Depression Screen     03/09/2024    1:09 PM 03/08/2024    9:50 AM 01/28/2024   10:19 AM 01/28/2024    9:48 AM 01/05/2024   11:00 AM 12/07/2023    2:40 PM 10/28/2023    9:42 AM  PHQ 2/9 Scores  PHQ - 2 Score 0 0 1 0 0 0 0  PHQ- 9 Score 0    0      Fall Risk     03/09/2024    1:07 PM 03/08/2024    9:50 AM 01/28/2024   10:18 AM 01/28/2024    9:47 AM 01/27/2024    9:47 AM  Fall Risk   Falls in the past year? 0 0 0 0 0  Number falls in past yr: 0  0 0   Injury with Fall? 0  0 0   Risk for fall due to : No Fall Risks  No Fall Risks No Fall Risks   Follow up Falls evaluation completed;Falls prevention discussed  Falls evaluation completed Falls evaluation completed     MEDICARE RISK AT HOME:  Medicare Risk at Home Any stairs in or around the home?: No If so, are there any without  handrails?: No Home free of loose throw rugs in walkways, pet beds, electrical cords, etc?: Yes Adequate lighting in your home to reduce risk of falls?: Yes Life alert?: No Use of a cane, walker or w/c?: No Grab bars in the bathroom?: No Shower chair or bench in shower?: No Elevated toilet seat or a handicapped toilet?: No  TIMED UP AND GO:  Was the test performed?  No  Cognitive Function: 6CIT completed        03/09/2024    1:14 PM  6CIT Screen  What Year? 0 points  What month? 0 points  What time? 0 points  Count back from 20 0 points  Months in reverse 0 points  Repeat phrase 0 points  Total Score 0 points    Immunizations Immunization History  Administered Date(s) Administered   Hepatitis B 11/16/2019, 12/14/2019, 04/13/2020   Influenza Inj Mdck Quad Pf 03/13/2020   Influenza Split 06/10/2011, 03/01/2012   Influenza Whole 04/24/2013   Influenza, Seasonal, Injecte, Preservative Fre 02/18/2023   Influenza,inj,Quad PF,6+ Mos 03/24/2019, 06/11/2022   Influenza-Unspecified 03/07/2015, 07/24/2016, 03/12/2020   PFIZER(Purple Top)SARS-COV-2 Vaccination 09/10/2019, 10/04/2019   Tdap 05/01/2012    Screening Tests Health Maintenance  Topic Date Due   Medicare Annual Wellness (AWV)  Never done   Pneumococcal Vaccine: 50+ Years (1 of 2 - PCV) Never done   Zoster Vaccines- Shingrix (1 of 2) Never done   COVID-19 Vaccine (3 - 2025-26 season) 02/22/2024   DTaP/Tdap/Td (2 - Td or Tdap) 08/23/2024 (Originally 05/01/2022)   Influenza Vaccine  09/20/2024 (Originally 01/22/2024)   OPHTHALMOLOGY EXAM  03/19/2024   HEMOGLOBIN A1C  07/30/2024   Diabetic kidney evaluation - eGFR measurement  08/23/2024   Diabetic kidney evaluation - Urine ACR  08/23/2024   FOOT EXAM  08/23/2024   Mammogram  02/03/2025   Cervical Cancer Screening (HPV/Pap Cotest)  10/10/2025   Colonoscopy  05/21/2027   Hepatitis B Vaccines 19-59 Average Risk  Completed   Hepatitis C Screening  Completed   HPV  VACCINES  Aged Out   Meningococcal B Vaccine  Aged Out   HIV Screening  Discontinued    Health Maintenance Items Addressed: Discussed the need to update tetanus,  pneumonia, shingles and flu vaccines.   Additional Screening:  Vision Screening: Recommended annual ophthalmology exams for early detection of glaucoma and other disorders of the eye. Is the patient up to date with their annual eye exam?  Yes  Who is the provider or what is the name of the office in which the patient attends annual eye exams? [facility]  Dental Screening: Recommended annual dental exams for proper oral hygiene  Community Resource Referral / Chronic Care Management: CRR required this visit?  No   CCM required this visit?  No   Plan:    I have personally reviewed and noted the following in the patient's chart:   Medical and social history Use of alcohol, tobacco or illicit drugs  Current medications and supplements including opioid prescriptions. Patient is currently taking opioid prescriptions. Information provided to patient regarding non-opioid alternatives. Patient advised to discuss non-opioid treatment plan with their provider. Functional ability and status Nutritional status Physical activity Advanced directives List of other physicians Hospitalizations, surgeries, and ER visits in previous 12 months Vitals Screenings to include cognitive, depression, and falls Referrals and appointments  In addition, I have reviewed and discussed with patient certain preventive protocols, quality metrics, and best practice recommendations. A written personalized care plan for preventive services as well as general preventive health recommendations were provided to patient.   Angeline Fredericks, LPN   [id number]   After Visit Summary: (MyChart) Due to this being a telephonic visit, the after visit summary with patients personalized plan was offered to patient via MyChart   Notes: Nothing significant to report  at this time.

## 2024-03-25 ENCOUNTER — Other Ambulatory Visit: Payer: Self-pay | Admitting: Family

## 2024-04-02 ENCOUNTER — Other Ambulatory Visit: Payer: Self-pay | Admitting: Family

## 2024-04-02 DIAGNOSIS — E119 Type 2 diabetes mellitus without complications: Secondary | ICD-10-CM

## 2024-04-07 ENCOUNTER — Other Ambulatory Visit: Payer: Self-pay | Admitting: Family

## 2024-04-07 DIAGNOSIS — I10 Essential (primary) hypertension: Secondary | ICD-10-CM

## 2024-04-20 LAB — OPHTHALMOLOGY REPORT-SCANNED

## 2024-05-01 ENCOUNTER — Other Ambulatory Visit: Payer: Self-pay | Admitting: Family

## 2024-05-01 DIAGNOSIS — E119 Type 2 diabetes mellitus without complications: Secondary | ICD-10-CM

## 2024-05-02 ENCOUNTER — Ambulatory Visit: Admitting: Family

## 2024-05-02 ENCOUNTER — Ambulatory Visit: Payer: Self-pay | Admitting: Family

## 2024-05-02 VITALS — BP 118/64 | HR 92 | Temp 97.9°F | Ht 64.0 in | Wt 141.4 lb

## 2024-05-02 DIAGNOSIS — M25512 Pain in left shoulder: Secondary | ICD-10-CM | POA: Diagnosis not present

## 2024-05-02 DIAGNOSIS — M542 Cervicalgia: Secondary | ICD-10-CM | POA: Diagnosis not present

## 2024-05-02 DIAGNOSIS — E119 Type 2 diabetes mellitus without complications: Secondary | ICD-10-CM | POA: Diagnosis not present

## 2024-05-02 DIAGNOSIS — Z23 Encounter for immunization: Secondary | ICD-10-CM | POA: Diagnosis not present

## 2024-05-02 DIAGNOSIS — Z7984 Long term (current) use of oral hypoglycemic drugs: Secondary | ICD-10-CM

## 2024-05-02 DIAGNOSIS — G8929 Other chronic pain: Secondary | ICD-10-CM

## 2024-05-02 DIAGNOSIS — Z7985 Long-term (current) use of injectable non-insulin antidiabetic drugs: Secondary | ICD-10-CM

## 2024-05-02 DIAGNOSIS — R7309 Other abnormal glucose: Secondary | ICD-10-CM

## 2024-05-02 LAB — COMPREHENSIVE METABOLIC PANEL WITH GFR
ALT: 7 U/L (ref 0–35)
AST: 16 U/L (ref 0–37)
Albumin: 4.4 g/dL (ref 3.5–5.2)
Alkaline Phosphatase: 52 U/L (ref 39–117)
BUN: 9 mg/dL (ref 6–23)
CO2: 25 meq/L (ref 19–32)
Calcium: 10 mg/dL (ref 8.4–10.5)
Chloride: 103 meq/L (ref 96–112)
Creatinine, Ser: 0.72 mg/dL (ref 0.40–1.20)
GFR: 90.21 mL/min (ref >60.00)
Glucose, Bld: 113 mg/dL — ABNORMAL HIGH (ref 70–99)
Potassium: 3.9 meq/L (ref 3.5–5.1)
Sodium: 137 meq/L (ref 135–145)
Total Bilirubin: 0.3 mg/dL (ref 0.2–1.2)
Total Protein: 7.9 g/dL (ref 6.0–8.3)

## 2024-05-02 LAB — HEMOGLOBIN A1C: Hgb A1c MFr Bld: 6.9 % — ABNORMAL HIGH (ref 4.6–6.5)

## 2024-05-02 NOTE — Progress Notes (Unsigned)
 Assessment & Plan:  Need for influenza vaccination -     Flu vaccine trivalent PF, 6mos and older(Flulaval,Afluria,Fluarix,Fluzone)  Elevated glucose  Diabetes mellitus without complication [facility]) Assessment & Plan: Lab Results  Component Value Date   HGBA1C 6.9 (H) 05/02/2024   Increased from prior.  Result note to patient in regards to referral to nutritionist and/or increasing ozempic . Continue metformin  1000 mg twice daily and Ozempic  (semaglutide ) 0.5 mg for now.  Orders: -     Comprehensive metabolic panel with GFR -     Hemoglobin A1c -     Gabapentin ; Take 1 capsule (100 mg total) by mouth daily as needed.  Neck pain -     traMADol  HCl; Take 1 tablet (50 mg total) by mouth daily as needed. -     Gabapentin ; Take 1 capsule (100 mg total) by mouth daily as needed. -     Cyclobenzaprine  HCl; Take 1-2 tablets (5-10 mg total) by mouth 3 (three) times daily as needed for muscle spasms.  Dispense: 90 tablet; Refill: 1  Chronic left shoulder pain Assessment & Plan: Chronic, improved.  Continue as needed use of tramadol  50 mg every day prn, gabapentin  to 100 mg daily as needed , flexeril  5 mg 3 times daily as needed.  Orders: -     traMADol  HCl; Take 1 tablet (50 mg total) by mouth daily as needed.     Return precautions given.   Risks, benefits, and alternatives of the medications and treatment plan prescribed today were discussed, and patient expressed understanding.   Education regarding symptom management and diagnosis given to patient on AVS either electronically or printed.  No follow-ups on file.  [name], FNP  Subjective:    Patient ID: Melissa Garrison, female    DOB: 11/19/1960, 63 y.o.   MRN: [id number]  CC: Melissa Garrison is a 63 y.o. female who presents today for follow up.   HPI: HPI Discussed the use of AI scribe software for clinical note transcription with the patient, who gave verbal consent to proceed.  History of Present Illness    Denyce Garrison is a 63 year old female who presents for follow-up and pain management of chronic left shoulder pain.  She experiences chronic left shoulder pain which is improved. S/p left pulse radiofrequency ablation, which provided immediate relief. She is planning to schedule another injection with Dr Marcelino.  Her current medication regimen includes tramadol  50mg  at bedtime prn .  She also uses meloxicam  as needed, taking it with food to avoid stomach upset, and gabapentin  100mg  every day on an as-needed basis.  She takes Flexeril  5mg  3 times daily as needed.  She does not feel overly sedated on regimen.   No chest pain or breathing difficulties.    She is up to date with her mammogram and colonoscopy.    Follow-up Dr. Lateef 03/08/2024 for left shoulder pain, 03/08/2024 Mammogram UTD  Allergies: Patient has no known allergies. Current Outpatient Medications on File Prior to Visit  Medication Sig Dispense Refill   amLODipine  (NORVASC ) 5 MG tablet Take 1 tablet (5 mg total) by mouth daily. 90 tablet 3   Blood Glucose Monitoring Suppl DEVI 1 each by Does not apply route in the morning, at noon, and at bedtime. May substitute to any manufacturer covered by patient's insurance. 1 each 0   Lancets (FREESTYLE) lancets Use as instructed 100 each 12   lisinopril  (ZESTRIL ) 40 MG tablet Take 1 tablet (40 mg total) by mouth daily. 90  tablet 3   meloxicam  (MOBIC ) 7.5 MG tablet TAKE 1 TABLET BY MOUTH ONCE DAILY AS NEEDED FOR  PAIN  (FOR  UP  TO  10  DAYS) 30 tablet 1   metFORMIN  (GLUCOPHAGE -XR) 500 MG 24 hr tablet Take 2 tablets (1,000 mg total) by mouth 2 (two) times daily with a meal. 360 tablet 3   OneTouch Delica Lancets 33G MISC Used to check blood sugar once daily. 200 each 3   OZEMPIC , 0.25 OR 0.5 MG/DOSE, 2 MG/3ML SOPN INJECT 0.5MG  UNDER THE SKIN  ONCE A WEEK 3 mL 0   rosuvastatin  (CRESTOR ) 10 MG tablet TAKE ONE TABLET BY MOUTH THREE DAYS A WEEK 48 tablet 0   No current  facility-administered medications on file prior to visit.    Review of Systems    Objective:    BP 118/64   Pulse 92   Temp 97.9 F (36.6 C)   Ht 5' 4 (1.626 m)   Wt 141 lb 6.4 oz (64.1 kg)   SpO2 99%   BMI 24.27 kg/m  BP Readings from Last 3 Encounters:  05/02/24 118/64  03/08/24 (!) 143/71  01/28/24 128/64   Wt Readings from Last 3 Encounters:  05/02/24 141 lb 6.4 oz (64.1 kg)  03/09/24 140 lb (63.5 kg)  03/08/24 140 lb (63.5 kg)    Physical Exam

## 2024-05-03 MED ORDER — GABAPENTIN 100 MG PO CAPS: 100.0000 mg | ORAL_CAPSULE | Freq: Every day | ORAL | Status: AC | PRN

## 2024-05-03 MED ORDER — TRAMADOL HCL 50 MG PO TABS: 50.0000 mg | ORAL_TABLET | Freq: Every day | ORAL | Status: AC | PRN

## 2024-05-03 MED ORDER — CYCLOBENZAPRINE HCL 5 MG PO TABS
5.0000 mg | ORAL_TABLET | Freq: Three times a day (TID) | ORAL | 1 refills | Status: AC | PRN
Start: 2024-05-03 — End: ?

## 2024-05-03 NOTE — Assessment & Plan Note (Addendum)
 Lab Results  Component Value Date   HGBA1C 6.9 (H) 05/02/2024   Increased from prior.  Result note to patient in regards to referral to nutritionist and/or increasing ozempic . Continue metformin  1000 mg twice daily and Ozempic  (semaglutide ) 0.5 mg for now.

## 2024-05-03 NOTE — Assessment & Plan Note (Signed)
 Chronic, improved.  Continue as needed use of tramadol  50 mg every day prn, gabapentin  to 100 mg daily as needed , flexeril  5 mg 3 times daily as needed.

## 2024-05-04 ENCOUNTER — Encounter: Payer: Self-pay | Admitting: Student in an Organized Health Care Education/Training Program

## 2024-05-04 ENCOUNTER — Ambulatory Visit
Attending: Student in an Organized Health Care Education/Training Program | Admitting: Student in an Organized Health Care Education/Training Program

## 2024-05-04 VITALS — BP 121/75 | HR 79 | Temp 97.7°F | Resp 16 | Ht 64.0 in | Wt 140.0 lb

## 2024-05-04 DIAGNOSIS — G8929 Other chronic pain: Secondary | ICD-10-CM | POA: Diagnosis present

## 2024-05-04 DIAGNOSIS — S4432XS Injury of axillary nerve, left arm, sequela: Secondary | ICD-10-CM | POA: Insufficient documentation

## 2024-05-04 DIAGNOSIS — M12812 Other specific arthropathies, not elsewhere classified, left shoulder: Secondary | ICD-10-CM | POA: Diagnosis not present

## 2024-05-04 DIAGNOSIS — M75102 Unspecified rotator cuff tear or rupture of left shoulder, not specified as traumatic: Secondary | ICD-10-CM | POA: Diagnosis not present

## 2024-05-04 DIAGNOSIS — M25512 Pain in left shoulder: Secondary | ICD-10-CM | POA: Diagnosis not present

## 2024-05-04 MED ORDER — DEXAMETHASONE SOD PHOSPHATE PF 10 MG/ML IJ SOLN
10.0000 mg | Freq: Once | INTRAMUSCULAR | Status: AC
  Administered 2024-05-04: 10 mg

## 2024-05-04 MED ORDER — DIAZEPAM 5 MG PO TABS
10.0000 mg | ORAL_TABLET | ORAL | Status: AC
  Administered 2024-05-04: 10 mg via ORAL

## 2024-05-04 MED ORDER — ROPIVACAINE HCL 2 MG/ML IJ SOLN
INTRAMUSCULAR | Status: AC
  Filled 2024-05-04: qty 20

## 2024-05-04 MED ORDER — LIDOCAINE HCL 2 % IJ SOLN
20.0000 mL | Freq: Once | INTRAMUSCULAR | Status: AC
  Administered 2024-05-04: 400 mg

## 2024-05-04 MED ORDER — LIDOCAINE HCL 2 % IJ SOLN
INTRAMUSCULAR | Status: AC
  Filled 2024-05-04: qty 20

## 2024-05-04 MED ORDER — DIAZEPAM 5 MG PO TABS
ORAL_TABLET | ORAL | Status: AC
  Filled 2024-05-04: qty 1

## 2024-05-04 NOTE — Addendum Note (Signed)
 Addended by: MARCELINO NURSE on: 05/04/2024 03:17 PM   Modules accepted: Orders

## 2024-05-04 NOTE — Progress Notes (Signed)
Safety precautions to be maintained throughout the outpatient stay will include: orient to surroundings, keep bed in low position, maintain call bell within reach at all times, provide assistance with transfer out of bed and ambulation. Safety precautions to be maintained throughout the outpatient stay will include: orient to surroundings, keep bed in low position, maintain call bell within reach at all times, provide assistance with transfer out of bed and ambulation.  

## 2024-05-04 NOTE — Progress Notes (Addendum)
 PROVIDER NOTE: Interpretation of information contained herein should be left to medically-trained personnel. Specific patient instructions are provided elsewhere under Patient Instructions section of medical record. This document was created in part using STT-dictation technology, any transcriptional errors that may result from this process are unintentional.  Patient: Melissa Garrison Type: Established DOB: 15-May-1961 MRN: [id number] PCP: Dineen Rollene MATSU, FNP  Service: Procedure DOS: 05/04/2024 Setting: Ambulatory Location: Ambulatory outpatient facility Delivery: Face-to-face Provider: Wallie Sherry, MD Specialty: Interventional Pain Management Specialty designation: 09 Location: Outpatient facility Ref. Prov.: Dineen Rollene MATSU, FNP       Interventional Therapy  Procedure: Ultrasound-Guided Pulsed Radiofrequency Ablation of the Left Axillary Nerve Laterality:  Left  Level: Approximately 6 cm inferior to the left acromion Imaging: Ultrasound-guided         Anesthesia: Local anesthesia (1-2% Lidocaine ) Sedation: Valium  5 mg PO DOS: 05/04/2024  Performed by: Wallie Sherry, MD  Purpose: Therapeutic Indications: Shoulder pain, severe enough to impact quality of life and/or function. 1. Injury of left axillary nerve, sequela   2. Chronic left shoulder pain   3. Left rotator cuff tear arthropathy      NAS-11 score:   Pre-procedure: 7 /10   Post-procedure: 0-No pain/10     Target: Axillary nerve, terminal branches Location: Inferior to acromion Region: left deltoid Approach: Percutaneous  Neuroanatomy: Target region is a middle of the deltoid muscle inferior to acromion   Position / Prep / Materials:  Position: Prone Materials:  Tray: Block Needle(s):  Type: Spinal  Gauge (G): 22  Length: 3.5 in.  Qty: 1 Prep solution: DuraPrep (Iodine Povacrylex [0.7% available iodine] and Isopropyl Alcohol, 74% w/w) Prep Area: Entire posterior shoulder area. From upper spine to  shoulder proper (upper arm), and from lateral neck to lower tip of shoulder blade.   H&P (Pre-op Assessment):  Melissa Garrison is a 63 y.o. (year old), female patient, seen today for interventional treatment. She  has a past surgical history that includes Laparoscopic hysterectomy; Cholecystectomy (2011); Humerus surgery (Left); Hydradenitis excision (Right, 03/20/2017); Breast biopsy (Left, 12/05/2015); Colonoscopy (2017); Shoulder arthroscopy with subacromial decompression and open rotator cuff repair, open bicep tendon repair (Left, 01/04/2021); Capsular release (Left, 07/02/2021); Lysis of adhesion (Left, 07/02/2021); Closed manipulation shoulder with steroid injection (Left, 07/02/2021); Shoulder arthroscopy (Left, 07/02/2021); Colonoscopy with propofol  (N/A, 05/20/2022); and Closed manipulation shoulder with steroid injection (Left, 08/11/2022). Melissa Garrison has a current medication list which includes the following prescription(s): amlodipine , blood glucose monitoring suppl, cyclobenzaprine , gabapentin , freestyle, lisinopril , meloxicam , metformin , onetouch delica lancets 33g, ozempic  (0.25 or 0.5 mg/dose), rosuvastatin , and tramadol , and the following Facility-Administered Medications: dexamethasone  and lidocaine . Her primarily concern today is the Shoulder Pain (left)  Initial Vital Signs:  Pulse/HCG Rate: 79ECG Heart Rate: 81 Temp: 97.7 F (36.5 C) Resp: 16 BP: 120/73 SpO2: 100 %  BMI: Estimated body mass index is 24.03 kg/m as calculated from the following:   Height as of this encounter: 5' 4 (1.626 m).   Weight as of this encounter: 140 lb (63.5 kg).  Risk Assessment: Allergies: Reviewed. She has no known allergies.  Allergy Precautions: None required Coagulopathies: Reviewed. None identified.  Blood-thinner therapy: None at this time Active Infection(s): Reviewed. None identified. Melissa Garrison is afebrile  Site Confirmation: Melissa Garrison was asked to confirm the procedure and  laterality before marking the site Procedure checklist: Completed Consent: Before the procedure and under the influence of no sedative(s), amnesic(s), or anxiolytics, the patient was informed of the treatment options, risks and possible complications. To fulfill  our ethical and legal obligations, as recommended by the American Medical Association's Code of Ethics, I have informed the patient of my clinical impression; the nature and purpose of the treatment or procedure; the risks, benefits, and possible complications of the intervention; the alternatives, including doing nothing; the risk(s) and benefit(s) of the alternative treatment(s) or procedure(s); and the risk(s) and benefit(s) of doing nothing. The patient was provided information about the general risks and possible complications associated with the procedure. These may include, but are not limited to: failure to achieve desired goals, infection, bleeding, organ or nerve damage, allergic reactions, paralysis, and death. In addition, the patient was informed of those risks and complications associated to the procedure, such as failure to decrease pain; infection; bleeding; organ or nerve damage with subsequent damage to sensory, motor, and/or autonomic systems, resulting in permanent pain, numbness, and/or weakness of one or several areas of the body; allergic reactions; (i.e.: anaphylactic reaction); and/or death. Furthermore, the patient was informed of those risks and complications associated with the medications. These include, but are not limited to: allergic reactions (i.e.: anaphylactic or anaphylactoid reaction(s)); adrenal axis suppression; blood sugar elevation that in diabetics may result in ketoacidosis or comma; water retention that in patients with history of congestive heart failure may result in shortness of breath, pulmonary edema, and decompensation with resultant heart failure; weight gain; swelling or edema; medication-induced  neural toxicity; particulate matter embolism and blood vessel occlusion with resultant organ, and/or nervous system infarction; and/or aseptic necrosis of one or more joints. Finally, the patient was informed that Medicine is not an exact science; therefore, there is also the possibility of unforeseen or unpredictable risks and/or possible complications that may result in a catastrophic outcome. The patient indicated having understood very clearly. We have given the patient no guarantees and we have made no promises. Enough time was given to the patient to ask questions, all of which were answered to the patient's satisfaction. Ms. Hildenbrand has indicated that she wanted to continue with the procedure. Attestation: I, the ordering provider, attest that I have discussed with the patient the benefits, risks, side-effects, alternatives, likelihood of achieving goals, and potential problems during recovery for the procedure that I have provided informed consent. Date  Time: 05/04/2024  8:44 AM  Pre-Procedure Preparation:  Monitoring: As per clinic protocol. Respiration, ETCO2, SpO2, BP, heart rate and rhythm monitor placed and checked for adequate function Safety Precautions: Patient was assessed for positional comfort and pressure points before starting the procedure. Time-out: I initiated and conducted the Time-out before starting the procedure, as per protocol. The patient was asked to participate by confirming the accuracy of the Time Out information. Verification of the correct person, site, and procedure were performed and confirmed by me, the nursing staff, and the patient. Time-out conducted as per Joint Commission's Universal Protocol (UP.01.01.01). Time: 0835 Start Time: 0835 hrs.  Description of Procedure:          Indication: Left shoulder pain secondary to axillary nerve entrapment/neuralgia; refractory to conservative management including medications and physical  therapy.   Technique:  The patient was placed in the sitting position with the left arm on the side The left anterior shoulder area was prepped and draped in a sterile fashion using chlorhexidine . Using a high-frequency linear ultrasound probe, the left axillary nerve was identified inferior to the deltoid head  Color Doppler was utilized to avoid vascular structures.  A 22-gauge, 100 mm RF cannula with a 5 mm active tip was inserted under  continuous ultrasound guidance   Appropriate positioning of the RF needle tip adjacent to the axillary nerve was confirmed  Stimulation and Treatment:  Sensory stimulation at 50 Hz produced concordant paresthesia in the left lateral region at < 0.5 volts.  Motor stimulation at 2 Hz did not elicit any unintended motor response (e.g., deltoid contraction).  After negative aspiration, pulsed radiofrequency was applied at 42C for 120 seconds, repeated for 2 cycles.  After lesioning, 5 cc solution made of 4 cc of 0.2% ropivacaine , 1 cc of Decadron  10 mg/cc was injected.   Needle was then withdrawn    Vitals:   05/04/24 0940 05/04/24 0945 05/04/24 0950 05/04/24 0955  BP: 123/74 118/73 121/71 121/75  Pulse:      Resp:  16 15 16   Temp:      SpO2: 100% 100% 100% 100%  Weight:      Height:         Start Time: 0835 hrs. End Time: 0948 hrs.  Imaging Guidance (Spinal):          Type of Imaging Technique: US  guidance  Indication(s): Assistance in needle guidance and placement for procedures requiring needle placement in or near specific anatomical locations not easily accessible without such assistance.   Antibiotic Prophylaxis:   Anti-infectives (From admission, onward)    None      Indication(s): None identified  Post-operative Assessment:  Post-procedure Vital Signs:  Pulse/HCG Rate: 7980 Temp: 97.7 F (36.5 C) Resp: 16 BP: 121/75 SpO2: 100 %  EBL: None  Complications: No immediate post-treatment complications observed by  team, or reported by patient.  Note: The patient tolerated the entire procedure well. A repeat set of vitals were taken after the procedure and the patient was kept under observation following institutional policy, for this type of procedure. Post-procedural neurological assessment was performed, showing return to baseline, prior to discharge. The patient was provided with post-procedure discharge instructions, including a section on how to identify potential problems. Should any problems arise concerning this procedure, the patient was given instructions to immediately contact us , at any time, without hesitation. In any case, we plan to contact the patient by telephone for a follow-up status report regarding this interventional procedure.  Comments:  No additional relevant information.  Plan of Care (POC)  Orders:  Orders Placed This Encounter  Procedures   DG PAIN CL[facility] C-ARM 1-60 MIN NO REPORT    Intraoperative interpretation by procedural physician at [facility].    Standing Status:   Standing    Number of Occurrences:   1    Reason for exam::   Assistance in needle guidance and placement for procedures requiring needle placement in or near specific anatomical locations not easily accessible without such assistance.     Medications ordered for procedure: Meds ordered this encounter  Medications   lidocaine  (XYLOCAINE ) 2 % (with pres) injection 400 mg   dexamethasone  (DECADRON ) injection 10 mg   Medications administered: [name] had no medications administered during this visit.  See the medical record for exact dosing, route, and time of administration.  Follow-up plan:   Return in about 3 months (around 08/04/2024) for PPE, F2F.      Recent Visits Date Type Provider Dept  03/08/24 Office Visit Marcelino Nurse, MD [facility]  Showing recent visits within past 90 days and meeting all other requirements Today's Visits Date Type Provider Dept   05/04/24 Procedure visit Marcelino Nurse, MD [facility]  Showing today's visits and meeting  all other requirements Future Appointments Date Type Provider Dept  07/26/24 Appointment Marcelino Nurse, MD [facility]  Showing future appointments within next 90 days and meeting all other requirements  Disposition: Discharge home  Discharge (Date  Time): 05/04/2024; 0950 hrs.   Primary Care Physician: Dineen Rollene MATSU, FNP Location: [facility] Note by: Nurse Marcelino, MD (TTS technology used. I apologize for any typographical errors that were not detected and corrected.) Date: 05/04/2024; Time: 3:17 PM  Disclaimer:  Medicine is not an visual merchandiser. The only guarantee in medicine is that nothing is guaranteed. It is important to note that the decision to proceed with this intervention was based on the information collected from the patient. The Data and conclusions were drawn from the patient's questionnaire, the interview, and the physical examination. Because the information was provided in large part by the patient, it cannot be guaranteed that it has not been purposely or unconsciously manipulated. Every effort has been made to obtain as much relevant data as possible for this evaluation. It is important to note that the conclusions that lead to this procedure are derived in large part from the available data. Always take into account that the treatment will also be dependent on availability of resources and existing treatment guidelines, considered by other Pain Management Practitioners as being common knowledge and practice, at the time of the intervention. For Medico-Legal purposes, it is also important to point out that variation in procedural techniques and pharmacological choices are the acceptable norm. The indications, contraindications, technique, and results of the above procedure should only be interpreted and judged by a Board-Certified  Interventional Pain Specialist with extensive familiarity and expertise in the same exact procedure and technique.

## 2024-05-04 NOTE — Addendum Note (Signed)
 Addended by: DORLENE CHANNEL F on: 05/04/2024 03:33 PM   Modules accepted: Orders

## 2024-05-04 NOTE — Patient Instructions (Signed)

## 2024-05-05 ENCOUNTER — Telehealth: Payer: Self-pay | Admitting: *Deleted

## 2024-05-05 NOTE — Telephone Encounter (Signed)
 Post procedure call:   no  questions or concerns.

## 2024-05-25 ENCOUNTER — Other Ambulatory Visit (HOSPITAL_COMMUNITY): Payer: Self-pay

## 2024-05-26 ENCOUNTER — Other Ambulatory Visit: Payer: Self-pay | Admitting: Family

## 2024-05-26 ENCOUNTER — Other Ambulatory Visit (HOSPITAL_COMMUNITY): Payer: Self-pay

## 2024-06-02 ENCOUNTER — Other Ambulatory Visit: Payer: Self-pay | Admitting: Family

## 2024-06-02 DIAGNOSIS — E119 Type 2 diabetes mellitus without complications: Secondary | ICD-10-CM

## 2024-07-02 ENCOUNTER — Other Ambulatory Visit: Payer: Self-pay | Admitting: Family

## 2024-07-02 DIAGNOSIS — I10 Essential (primary) hypertension: Secondary | ICD-10-CM

## 2024-07-26 ENCOUNTER — Encounter: Payer: Self-pay | Admitting: Student in an Organized Health Care Education/Training Program

## 2024-07-26 ENCOUNTER — Ambulatory Visit: Admitting: Student in an Organized Health Care Education/Training Program

## 2024-07-26 VITALS — BP 101/69 | HR 90 | Temp 97.3°F | Resp 16 | Ht 64.0 in | Wt 140.0 lb

## 2024-07-26 DIAGNOSIS — M75102 Unspecified rotator cuff tear or rupture of left shoulder, not specified as traumatic: Secondary | ICD-10-CM

## 2024-07-26 DIAGNOSIS — Z9889 Other specified postprocedural states: Secondary | ICD-10-CM

## 2024-07-26 DIAGNOSIS — S4432XS Injury of axillary nerve, left arm, sequela: Secondary | ICD-10-CM | POA: Diagnosis not present

## 2024-07-26 DIAGNOSIS — M25512 Pain in left shoulder: Secondary | ICD-10-CM

## 2024-07-26 DIAGNOSIS — M12812 Other specific arthropathies, not elsewhere classified, left shoulder: Secondary | ICD-10-CM | POA: Diagnosis not present

## 2024-07-26 DIAGNOSIS — G8929 Other chronic pain: Secondary | ICD-10-CM

## 2024-07-26 NOTE — Progress Notes (Signed)
 PROVIDER NOTE: Interpretation of information contained herein should be left to medically-trained personnel. Specific patient instructions are provided elsewhere under Patient Instructions section of medical record. This document was created in part using AI and STT-dictation technology, any transcriptional errors that may result from this process are unintentional.  Patient: Melissa Garrison  Service: E/M   PCP: Dineen Rollene MATSU, FNP  DOB: 28-Nov-1960  DOS: 07/26/2024  Provider: Wallie Sherry, MD  MRN: [id number]  Delivery: Face-to-face  Specialty: Interventional Pain Management  Type: Established Patient  Setting: Ambulatory outpatient facility  Specialty designation: 09  Referring Prov.: Dineen Rollene MATSU, FNP  Location: Outpatient office facility       History of present illness (HPI) Ms. Melissa Garrison, a 64 y.o. year old female, is here today because of her Injury of left axillary nerve, sequela [S44.32XS]. Ms. Melissa Garrison's primary complain today is Other (Left axillary nerve block )  Pain Assessment: Severity of Chronic pain is reported as a 8 /10. Location: Shoulder Left/into the left elbow. Onset: More than a month ago. Quality: Sore, Discomfort, Constant. Timing: Constant. Modifying factor(s): nothing currently. Vitals:  height is 5' 4 (1.626 m) and weight is 140 lb (63.5 kg). Her temporal temperature is 97.3 F (36.3 C) (abnormal). Her blood pressure is 101/69 and her pulse is 90. Her respiration is 16 and oxygen saturation is 100%.  BMI: Estimated body mass index is 24.03 kg/m as calculated from the following:   Height as of this encounter: 5' 4 (1.626 m).   Weight as of this encounter: 140 lb (63.5 kg).  Last encounter: 01/05/2024. Last procedure: 01/27/2024.  Reason for encounter:   Post-Procedure Evaluation   Procedure: Ultrasound-Guided Pulsed Radiofrequency Ablation of the Left Axillary Nerve Laterality:  Left  Level: Approximately 6 cm inferior to the left acromion Imaging:  Ultrasound-guided         Anesthesia: Local anesthesia (1-2% Lidocaine ) Sedation: Valium  5 mg PO DOS: 05/04/2024  Performed by: Wallie Sherry, MD   Purpose: Therapeutic Indications: Shoulder pain, severe enough to impact quality of life and/or function. 1. Injury of left axillary nerve, sequela   2. Chronic left shoulder pain   3. Left rotator cuff tear arthropathy         NAS-11 score:         Pre-procedure: 7 /10         Post-procedure: 0-No pain/10    Target: Axillary nerve, terminal branches Location: Inferior to acromion Region: left deltoid Approach: Percutaneous  Neuroanatomy: Target region is a middle of the deltoid muscle inferior to acromion  Effectiveness:  Initial hour after procedure: 100 %  Subsequent 4-6 hours post-procedure: 100 %  Analgesia past initial 6 hours: 70 % (patient experienced good pain relief, bumped into the dresser and the pain started back.  that happened in January.)  Ongoing improvement:  Analgesic:  back to baseline since trauma/injury    ROS  Constitutional: Denies any fever or chills Gastrointestinal: No reported hemesis, hematochezia, vomiting, or acute GI distress Musculoskeletal: left shoulder pain Neurological: No reported episodes of acute onset apraxia, aphasia, dysarthria, agnosia, amnesia, paralysis, loss of coordination, or loss of consciousness  Medication Review  Blood Glucose Monitoring Suppl, OneTouch Delica Lancets 33G, Semaglutide (0.25 or 0.5MG /DOS), amLODipine , cyclobenzaprine , freestyle, gabapentin , lisinopril , meloxicam , metFORMIN , rosuvastatin , and traMADol   History Review  Allergy: Ms. Melissa Garrison has no known allergies. Drug: Ms. Melissa Garrison  reports that she does not currently use drugs. Alcohol:  reports that she does not currently use alcohol after a past usage of  about 3.0 standard drinks of alcohol per week. Tobacco:  reports that she has been smoking cigarettes. She has a 5 pack-year smoking history. She has never  used smokeless tobacco. Social: Ms. Melissa Garrison  reports that she has been smoking cigarettes. She has a 5 pack-year smoking history. She has never used smokeless tobacco. She reports that she does not currently use alcohol after a past usage of about 3.0 standard drinks of alcohol per week. She reports that she does not currently use drugs. Medical:  has a past medical history of Allergic rhinitis (07/11/2011), Diabetes mellitus without complication (HCC), Hepatitis C, Hepatitis C, chronic (HCC) (07/11/2011), Hyperlipidemia, Hypertension (07/11/2011), IV drug abuse (HCC), Vitamin D  deficiency, and Wears dentures. Surgical: Ms. Melissa Garrison  has a past surgical history that includes Laparoscopic hysterectomy; Cholecystectomy (2011); Humerus surgery (Left); Hydradenitis excision (Right, 03/20/2017); Breast biopsy (Left, 12/05/2015); Colonoscopy (2017); Shoulder arthroscopy with subacromial decompression and open rotator cuff repair, open bicep tendon repair (Left, 01/04/2021); Capsular release (Left, 07/02/2021); Lysis of adhesion (Left, 07/02/2021); Closed manipulation shoulder with steroid injection (Left, 07/02/2021); Shoulder arthroscopy (Left, 07/02/2021); Colonoscopy with propofol  (N/A, 05/20/2022); and Closed manipulation shoulder with steroid injection (Left, 08/11/2022). Family: family history includes Diabetes in her father; Hypertension in her brother and mother.  Laboratory Chemistry Profile   Renal Lab Results  Component Value Date   BUN 9 05/02/2024   CREATININE 0.72 05/02/2024   GFR 90.21 05/02/2024   GFRAA >60 03/16/2017   GFRNONAA >60 07/14/2022    Hepatic Lab Results  Component Value Date   AST 16 05/02/2024   ALT 7 05/02/2024   ALBUMIN 4.4 05/02/2024   ALKPHOS 52 05/02/2024    Electrolytes Lab Results  Component Value Date   NA 137 05/02/2024   K 3.9 05/02/2024   CL 103 05/02/2024   CALCIUM  10.0 05/02/2024    Bone Lab Results  Component Value Date   VD25OH 13.67 (L)  10/18/2021    Inflammation (CRP: Acute Phase) (ESR: Chronic Phase) No results found for: CRP, ESRSEDRATE, LATICACIDVEN       Note: Above Lab results reviewed.  Recent Imaging Review  MM 3D SCREENING MAMMOGRAM BILATERAL BREAST CLINICAL DATA:  Screening.  EXAM: DIGITAL SCREENING BILATERAL MAMMOGRAM WITH TOMOSYNTHESIS AND CAD  TECHNIQUE: Bilateral screening digital craniocaudal and mediolateral oblique mammograms were obtained. Bilateral screening digital breast tomosynthesis was performed. The images were evaluated with computer-aided detection.  COMPARISON:  Previous exam(s).  ACR Breast Density Category c: The breasts are heterogeneously dense, which may obscure small masses.  FINDINGS: There are no findings suspicious for malignancy.  IMPRESSION: No mammographic evidence of malignancy. A result letter of this screening mammogram will be mailed directly to the patient.  RECOMMENDATION: Screening mammogram in one year. (Code:SM-B-01Y)  BI-RADS CATEGORY  1: Negative.  Electronically Signed   By: Dina  Arceo M.D.   On: 02/05/2024 14:17 Note: Reviewed        Physical Exam  Vitals: BP 101/69 (BP Location: Left Arm, Patient Position: Sitting, Cuff Size: Normal)   Pulse 90   Temp (!) 97.3 F (36.3 C) (Temporal)   Resp 16   Ht 5' 4 (1.626 m)   Wt 140 lb (63.5 kg)   SpO2 100%   BMI 24.03 kg/m  BMI: Estimated body mass index is 24.03 kg/m as calculated from the following:   Height as of this encounter: 5' 4 (1.626 m).   Weight as of this encounter: 140 lb (63.5 kg). Ideal: Ideal body weight: 54.7 kg (120 lb 9.5 oz)  Adjusted ideal body weight: 58.2 kg (128 lb 5.7 oz) General appearance: Well nourished, well developed, and well hydrated. In no apparent acute distress Mental status: Alert, oriented x 3 (person, place, & time)       Respiratory: No evidence of acute respiratory distress Eyes: PERLA  left shoulder pain and limited range of  motion  Assessment   Diagnosis  1. Injury of left axillary nerve, sequela   2. Chronic left shoulder pain   3. Left rotator cuff tear arthropathy   4. Hx of shoulder surgery      Updated Problems: No problems updated.  Plan of Care  Repeat left pulsed RFA of the left axillary nerve.  Patient endorses significant improvement in her left shoulder pain and her range of motion after last axillary RFA until she had a traumatic injury where she bumped her shoulder against a cupboard.  She states that since that incident she has been having severe left shoulder pain.    Orders:  Orders Placed This Encounter  Procedures   Radiofrequency ablation, other    Standing Status:   Future    Expected Date:   08/03/2024    Expiration Date:   11/01/2024    Scheduling Instructions:     Side(s): Left axillary nerve RFA     Sedation: Po Valium      Scheduling Timeframe: As soon as pre-approved    Where will this procedure be performed?:   [facility]    Return in about 8 days (around 08/03/2024) for Left axillary nerve pRFA, in clinic (PO Valium ).    Recent Visits Date Type Provider Dept  05/04/24 Procedure visit Marcelino Nurse, MD [facility]  Showing recent visits within past 90 days and meeting all other requirements Today's Visits Date Type Provider Dept  07/26/24 Office Visit Marcelino Nurse, MD [facility]  Showing today's visits and meeting all other requirements Future Appointments Date Type Provider Dept  08/10/24 Appointment Marcelino Nurse, MD [facility]  Showing future appointments within next 90 days and meeting all other requirements  I discussed the assessment and treatment plan with the patient. The patient was provided an opportunity to ask questions and all were answered. The patient agreed with the plan and demonstrated an understanding of the instructions.  Patient advised to call back or seek an in-person evaluation if the symptoms or  condition worsens.  I personally spent a total of 20 minutes in the care of the patient today including preparing to see the patient, getting/reviewing separately obtained history, performing a medically appropriate exam/evaluation, counseling and educating, placing orders, and documenting clinical information in the EHR.   Note by: Nurse Marcelino, MD (TTS and AI technology used. I apologize for any typographical errors that were not detected and corrected.) Date: 07/26/2024; Time: 9:03 AM

## 2024-07-26 NOTE — Patient Instructions (Signed)

## 2024-07-26 NOTE — Progress Notes (Signed)
 Safety precautions to be maintained throughout the outpatient stay will include: orient to surroundings, keep bed in low position, maintain call bell within reach at all times, provide assistance with transfer out of bed and ambulation.

## 2024-08-02 ENCOUNTER — Ambulatory Visit: Admitting: Family

## 2024-08-10 ENCOUNTER — Ambulatory Visit: Admitting: Student in an Organized Health Care Education/Training Program

## 2025-03-13 ENCOUNTER — Ambulatory Visit
